# Patient Record
Sex: Female | Born: 1975 | Race: White | Hispanic: No | Marital: Married | Smoking: Never smoker
Health system: Southern US, Community
[De-identification: ages and names within clinical notes are randomized; demographics above are authoritative.]

## PROBLEM LIST (undated history)

## (undated) DIAGNOSIS — R609 Edema, unspecified: Secondary | ICD-10-CM

## (undated) DIAGNOSIS — R002 Palpitations: Secondary | ICD-10-CM

## (undated) DIAGNOSIS — B009 Herpesviral infection, unspecified: Secondary | ICD-10-CM

## (undated) DIAGNOSIS — R928 Other abnormal and inconclusive findings on diagnostic imaging of breast: Secondary | ICD-10-CM

## (undated) DIAGNOSIS — D649 Anemia, unspecified: Secondary | ICD-10-CM

## (undated) DIAGNOSIS — M549 Dorsalgia, unspecified: Secondary | ICD-10-CM

## (undated) DIAGNOSIS — R948 Abnormal results of function studies of other organs and systems: Secondary | ICD-10-CM

## (undated) DIAGNOSIS — R87619 Unspecified abnormal cytological findings in specimens from cervix uteri: Secondary | ICD-10-CM

## (undated) DIAGNOSIS — F32A Depression, unspecified: Secondary | ICD-10-CM

## (undated) DIAGNOSIS — G43909 Migraine, unspecified, not intractable, without status migrainosus: Secondary | ICD-10-CM

## (undated) DIAGNOSIS — Z0000 Encounter for general adult medical examination without abnormal findings: Secondary | ICD-10-CM

## (undated) DIAGNOSIS — I10 Essential (primary) hypertension: Secondary | ICD-10-CM

## (undated) DIAGNOSIS — E669 Obesity, unspecified: Secondary | ICD-10-CM

## (undated) DIAGNOSIS — A048 Other specified bacterial intestinal infections: Secondary | ICD-10-CM

## (undated) DIAGNOSIS — T7840XA Allergy, unspecified, initial encounter: Secondary | ICD-10-CM

## (undated) DIAGNOSIS — F329 Major depressive disorder, single episode, unspecified: Secondary | ICD-10-CM

## (undated) DIAGNOSIS — F419 Anxiety disorder, unspecified: Secondary | ICD-10-CM

## (undated) DIAGNOSIS — N809 Endometriosis, unspecified: Secondary | ICD-10-CM

## (undated) DIAGNOSIS — R1013 Epigastric pain: Secondary | ICD-10-CM

## (undated) DIAGNOSIS — G4730 Sleep apnea, unspecified: Secondary | ICD-10-CM

## (undated) DIAGNOSIS — R635 Abnormal weight gain: Secondary | ICD-10-CM

## (undated) DIAGNOSIS — R5383 Other fatigue: Secondary | ICD-10-CM

## (undated) DIAGNOSIS — R569 Unspecified convulsions: Secondary | ICD-10-CM

## (undated) DIAGNOSIS — N644 Mastodynia: Secondary | ICD-10-CM

## (undated) DIAGNOSIS — K219 Gastro-esophageal reflux disease without esophagitis: Secondary | ICD-10-CM

## (undated) HISTORY — PX: TONSILLECTOMY: SHX5217

## (undated) HISTORY — DX: Abnormal results of function studies of other organs and systems: R94.8

## (undated) HISTORY — DX: Endometriosis, unspecified: N80.9

## (undated) HISTORY — DX: Encounter for general adult medical examination without abnormal findings: Z00.00

## (undated) HISTORY — DX: Sleep apnea, unspecified: G47.30

## (undated) HISTORY — DX: Essential (primary) hypertension: I10

## (undated) HISTORY — DX: Dorsalgia, unspecified: M54.9

## (undated) HISTORY — DX: Unspecified abnormal cytological findings in specimens from cervix uteri: R87.619

## (undated) HISTORY — DX: Palpitations: R00.2

## (undated) HISTORY — DX: Edema, unspecified: R60.9

## (undated) HISTORY — DX: Depression, unspecified: F32.A

## (undated) HISTORY — DX: Migraine, unspecified, not intractable, without status migrainosus: G43.909

## (undated) HISTORY — DX: Epigastric pain: R10.13

## (undated) HISTORY — PX: ABDOMINAL HYSTERECTOMY: SHX81

## (undated) HISTORY — DX: Other specified bacterial intestinal infections: A04.8

## (undated) HISTORY — DX: Obesity, unspecified: E66.9

## (undated) HISTORY — DX: Anxiety disorder, unspecified: F41.9

## (undated) HISTORY — DX: Major depressive disorder, single episode, unspecified: F32.9

## (undated) HISTORY — PX: OTHER SURGICAL HISTORY: SHX169

## (undated) HISTORY — DX: Gastro-esophageal reflux disease without esophagitis: K21.9

## (undated) HISTORY — DX: Mastodynia: N64.4

## (undated) HISTORY — PX: DILATION AND CURETTAGE OF UTERUS: SHX78

## (undated) HISTORY — DX: Other abnormal and inconclusive findings on diagnostic imaging of breast: R92.8

## (undated) HISTORY — PX: WISDOM TOOTH EXTRACTION: SHX21

## (undated) HISTORY — DX: Anemia, unspecified: D64.9

## (undated) HISTORY — DX: Abnormal weight gain: R63.5

## (undated) HISTORY — DX: Unspecified convulsions: R56.9

## (undated) HISTORY — DX: Allergy, unspecified, initial encounter: T78.40XA

## (undated) HISTORY — DX: Other fatigue: R53.83

## (undated) HISTORY — DX: Herpesviral infection, unspecified: B00.9

---

## 2004-03-09 ENCOUNTER — Ambulatory Visit: Payer: Self-pay | Admitting: Internal Medicine

## 2004-04-27 ENCOUNTER — Ambulatory Visit: Payer: Self-pay | Admitting: Family Medicine

## 2004-06-01 ENCOUNTER — Ambulatory Visit: Payer: Self-pay | Admitting: Family Medicine

## 2004-08-23 ENCOUNTER — Ambulatory Visit: Payer: Self-pay | Admitting: Family Medicine

## 2004-08-29 ENCOUNTER — Ambulatory Visit: Payer: Self-pay | Admitting: Family Medicine

## 2004-11-05 ENCOUNTER — Ambulatory Visit: Payer: Self-pay | Admitting: Internal Medicine

## 2005-01-30 ENCOUNTER — Ambulatory Visit: Payer: Self-pay | Admitting: Family Medicine

## 2005-02-20 ENCOUNTER — Encounter: Admission: RE | Admit: 2005-02-20 | Discharge: 2005-02-20 | Payer: Self-pay | Admitting: Neurology

## 2005-02-28 ENCOUNTER — Encounter: Admission: RE | Admit: 2005-02-28 | Discharge: 2005-02-28 | Payer: Self-pay | Admitting: Neurology

## 2005-03-20 ENCOUNTER — Encounter: Admission: RE | Admit: 2005-03-20 | Discharge: 2005-03-20 | Payer: Self-pay | Admitting: Neurology

## 2005-04-17 ENCOUNTER — Ambulatory Visit: Payer: Self-pay | Admitting: Family Medicine

## 2005-09-18 ENCOUNTER — Ambulatory Visit: Payer: Self-pay | Admitting: Gynecology

## 2005-12-17 ENCOUNTER — Ambulatory Visit: Payer: Self-pay | Admitting: Gynecology

## 2006-01-01 ENCOUNTER — Ambulatory Visit: Payer: Self-pay | Admitting: Family Medicine

## 2006-01-24 ENCOUNTER — Ambulatory Visit: Payer: Self-pay | Admitting: Family Medicine

## 2006-01-24 ENCOUNTER — Ambulatory Visit (HOSPITAL_COMMUNITY): Admission: RE | Admit: 2006-01-24 | Discharge: 2006-01-24 | Payer: Self-pay | Admitting: Family Medicine

## 2006-02-11 ENCOUNTER — Ambulatory Visit: Payer: Self-pay | Admitting: Family Medicine

## 2006-04-22 HISTORY — PX: PARTIAL HYSTERECTOMY: SHX80

## 2006-06-19 ENCOUNTER — Ambulatory Visit: Payer: Self-pay | Admitting: Obstetrics & Gynecology

## 2006-07-30 ENCOUNTER — Ambulatory Visit: Payer: Self-pay | Admitting: Obstetrics & Gynecology

## 2006-11-09 ENCOUNTER — Emergency Department: Payer: Self-pay | Admitting: Emergency Medicine

## 2006-11-10 ENCOUNTER — Ambulatory Visit: Payer: Self-pay | Admitting: Gynecology

## 2006-11-14 ENCOUNTER — Ambulatory Visit (HOSPITAL_COMMUNITY): Admission: RE | Admit: 2006-11-14 | Discharge: 2006-11-14 | Payer: Self-pay | Admitting: Gynecology

## 2006-12-19 ENCOUNTER — Ambulatory Visit (HOSPITAL_COMMUNITY): Admission: RE | Admit: 2006-12-19 | Discharge: 2006-12-19 | Payer: Self-pay | Admitting: Gynecology

## 2006-12-19 ENCOUNTER — Ambulatory Visit: Payer: Self-pay | Admitting: Gynecology

## 2007-01-01 ENCOUNTER — Ambulatory Visit: Payer: Self-pay | Admitting: Gynecology

## 2007-02-19 ENCOUNTER — Ambulatory Visit: Payer: Self-pay | Admitting: Family Medicine

## 2007-02-19 DIAGNOSIS — R609 Edema, unspecified: Secondary | ICD-10-CM | POA: Insufficient documentation

## 2007-03-09 ENCOUNTER — Ambulatory Visit (HOSPITAL_COMMUNITY): Admission: RE | Admit: 2007-03-09 | Discharge: 2007-03-10 | Payer: Self-pay | Admitting: Gynecology

## 2007-03-09 ENCOUNTER — Encounter (INDEPENDENT_AMBULATORY_CARE_PROVIDER_SITE_OTHER): Payer: Self-pay | Admitting: Gynecology

## 2007-03-09 ENCOUNTER — Ambulatory Visit: Payer: Self-pay | Admitting: Gynecology

## 2007-03-31 ENCOUNTER — Ambulatory Visit: Payer: Self-pay | Admitting: Vascular Surgery

## 2007-04-07 ENCOUNTER — Ambulatory Visit: Payer: Self-pay | Admitting: Gynecology

## 2007-04-14 ENCOUNTER — Encounter (INDEPENDENT_AMBULATORY_CARE_PROVIDER_SITE_OTHER): Payer: Self-pay | Admitting: Internal Medicine

## 2007-06-03 ENCOUNTER — Ambulatory Visit: Payer: Self-pay | Admitting: Family Medicine

## 2007-06-03 DIAGNOSIS — F325 Major depressive disorder, single episode, in full remission: Secondary | ICD-10-CM | POA: Insufficient documentation

## 2007-06-03 DIAGNOSIS — F32A Depression, unspecified: Secondary | ICD-10-CM | POA: Insufficient documentation

## 2007-06-03 DIAGNOSIS — F341 Dysthymic disorder: Secondary | ICD-10-CM

## 2007-06-18 ENCOUNTER — Ambulatory Visit: Payer: Self-pay | Admitting: Family Medicine

## 2007-07-07 ENCOUNTER — Telehealth: Payer: Self-pay | Admitting: Family Medicine

## 2007-07-14 ENCOUNTER — Encounter: Payer: Self-pay | Admitting: Family Medicine

## 2007-07-16 ENCOUNTER — Ambulatory Visit: Payer: Self-pay | Admitting: Obstetrics & Gynecology

## 2007-07-22 ENCOUNTER — Encounter: Payer: Self-pay | Admitting: Internal Medicine

## 2007-08-06 ENCOUNTER — Ambulatory Visit: Payer: Self-pay | Admitting: Obstetrics & Gynecology

## 2007-08-14 ENCOUNTER — Ambulatory Visit (HOSPITAL_COMMUNITY): Admission: RE | Admit: 2007-08-14 | Discharge: 2007-08-14 | Payer: Self-pay | Admitting: Gynecology

## 2007-09-10 ENCOUNTER — Ambulatory Visit: Payer: Self-pay | Admitting: Obstetrics & Gynecology

## 2007-10-27 ENCOUNTER — Ambulatory Visit: Payer: Self-pay | Admitting: Obstetrics & Gynecology

## 2007-11-16 ENCOUNTER — Ambulatory Visit: Payer: Self-pay | Admitting: Family Medicine

## 2007-11-25 ENCOUNTER — Encounter (INDEPENDENT_AMBULATORY_CARE_PROVIDER_SITE_OTHER): Payer: Self-pay | Admitting: Internal Medicine

## 2008-03-03 ENCOUNTER — Ambulatory Visit: Payer: Self-pay | Admitting: Obstetrics and Gynecology

## 2008-03-04 ENCOUNTER — Encounter: Payer: Self-pay | Admitting: Family Medicine

## 2008-03-04 LAB — CONVERTED CEMR LAB
Trich, Wet Prep: NONE SEEN
WBC, Wet Prep HPF POC: NONE SEEN
Yeast Wet Prep HPF POC: NONE SEEN

## 2008-04-07 ENCOUNTER — Ambulatory Visit: Payer: Self-pay | Admitting: Family Medicine

## 2008-05-02 ENCOUNTER — Ambulatory Visit: Payer: Self-pay | Admitting: Family Medicine

## 2008-05-02 DIAGNOSIS — M549 Dorsalgia, unspecified: Secondary | ICD-10-CM | POA: Insufficient documentation

## 2008-05-02 DIAGNOSIS — K3189 Other diseases of stomach and duodenum: Secondary | ICD-10-CM

## 2008-05-02 DIAGNOSIS — R1013 Epigastric pain: Secondary | ICD-10-CM

## 2008-07-14 ENCOUNTER — Ambulatory Visit: Payer: Self-pay | Admitting: Family Medicine

## 2008-11-15 ENCOUNTER — Ambulatory Visit: Payer: Self-pay | Admitting: Family Medicine

## 2008-11-15 ENCOUNTER — Encounter (INDEPENDENT_AMBULATORY_CARE_PROVIDER_SITE_OTHER): Payer: Self-pay | Admitting: Internal Medicine

## 2008-11-15 DIAGNOSIS — R5383 Other fatigue: Secondary | ICD-10-CM

## 2008-11-15 DIAGNOSIS — R5381 Other malaise: Secondary | ICD-10-CM | POA: Insufficient documentation

## 2009-01-19 ENCOUNTER — Ambulatory Visit: Payer: Self-pay | Admitting: Family Medicine

## 2009-01-19 DIAGNOSIS — E669 Obesity, unspecified: Secondary | ICD-10-CM | POA: Insufficient documentation

## 2009-01-19 DIAGNOSIS — R948 Abnormal results of function studies of other organs and systems: Secondary | ICD-10-CM

## 2009-03-15 ENCOUNTER — Encounter (INDEPENDENT_AMBULATORY_CARE_PROVIDER_SITE_OTHER): Payer: Self-pay | Admitting: Internal Medicine

## 2009-03-31 ENCOUNTER — Ambulatory Visit: Payer: Self-pay | Admitting: Family Medicine

## 2009-08-09 ENCOUNTER — Ambulatory Visit: Payer: Self-pay | Admitting: Family Medicine

## 2009-08-09 DIAGNOSIS — R002 Palpitations: Secondary | ICD-10-CM

## 2009-08-10 ENCOUNTER — Encounter: Payer: Self-pay | Admitting: Family Medicine

## 2009-08-10 LAB — CONVERTED CEMR LAB
HDL: 55 mg/dL
LDL Cholesterol: 99 mg/dL

## 2009-08-14 ENCOUNTER — Telehealth: Payer: Self-pay | Admitting: Family Medicine

## 2009-08-18 ENCOUNTER — Ambulatory Visit: Payer: Self-pay | Admitting: Cardiology

## 2009-08-27 ENCOUNTER — Encounter: Payer: Self-pay | Admitting: Cardiovascular Disease

## 2009-09-15 ENCOUNTER — Ambulatory Visit: Payer: Self-pay | Admitting: Cardiovascular Disease

## 2009-12-04 ENCOUNTER — Ambulatory Visit: Payer: Self-pay | Admitting: Cardiovascular Disease

## 2010-02-05 ENCOUNTER — Ambulatory Visit: Payer: Self-pay | Admitting: Family Medicine

## 2010-02-26 ENCOUNTER — Telehealth: Payer: Self-pay | Admitting: Family Medicine

## 2010-03-07 ENCOUNTER — Telehealth: Payer: Self-pay | Admitting: Family Medicine

## 2010-03-08 ENCOUNTER — Telehealth: Payer: Self-pay | Admitting: Family Medicine

## 2010-03-12 ENCOUNTER — Ambulatory Visit: Payer: Self-pay | Admitting: Family Medicine

## 2010-03-12 ENCOUNTER — Encounter: Payer: Self-pay | Admitting: Family Medicine

## 2010-03-21 ENCOUNTER — Telehealth: Payer: Self-pay | Admitting: Family Medicine

## 2010-03-22 ENCOUNTER — Ambulatory Visit: Payer: Self-pay | Admitting: Family Medicine

## 2010-04-22 ENCOUNTER — Ambulatory Visit: Payer: Self-pay | Admitting: Family Medicine

## 2010-05-21 ENCOUNTER — Ambulatory Visit
Admission: RE | Admit: 2010-05-21 | Discharge: 2010-05-21 | Payer: Self-pay | Source: Home / Self Care | Attending: Family Medicine | Admitting: Family Medicine

## 2010-05-21 DIAGNOSIS — N644 Mastodynia: Secondary | ICD-10-CM | POA: Insufficient documentation

## 2010-05-22 NOTE — Assessment & Plan Note (Signed)
Summary: CPX  CYD   Vital Signs:  Patient profile:   35 year old female Height:      66 inches Weight:      189 pounds BMI:     30.62 Temp:     98.8 degrees F oral Pulse rate:   68 / minute Pulse rhythm:   regular BP sitting:   110 / 80  (right arm) Cuff size:   regular  Vitals Entered By: Linde Gillis CMA Duncan Dull) (February 05, 2010 9:12 AM) CC: complete physicial, no pap   History of Present Illness: 35 yo here for CPX.  Has been doing well.  Has had several follow ups with Dr. Mariah Milling for her heart palpitations.  Much improved, has had a couple of episodes. Only takes the metoprolol 12.5 mg as needed. Does not make her feel dizzy or any other side effects.  Weight gain- has continued to gain weight over the last several years.  Weight 175 in 07/2007, was 165 in 2008.  Works out at [employer] three nights a week, just cancelled her [employer] as it was not really working for her.  Does not eat processed foods.    Depression- since her hysterectomy has had some decreased sex drive (still has her ovaries).  Always had some issues with anxiety and depression.   Current Medications (verified): 1)  Ibuprofen 200 Mg Tabs (Ibuprofen) .... Otc As Directed. 2)  Metoprolol Tartrate 25 Mg Tabs (Metoprolol Tartrate) .... Take One Tablet By Mouth Twice A Day As Needed For Palpatations 3)  Wellbutrin Sr 150 Mg Xr12h-Tab (Bupropion Hcl) .Marland Kitchen.. 1 Tab By Mouth Daily.  Allergies (verified): No Known Drug Allergies  Past History:  Past Medical History: Last updated: 08/18/2009 1. palpitations 2. fatigue 3. dyspepsia 4. back pain 5. depression/anxiety 6. History of TAH  Past Surgical History: Last updated: 08/17/2009 partial hysterectomy--endometriosis--2008 tonsillectomy, adenoidectomy, wisdom tooth extraction, and D&C.   Family History: Last updated: 08/18/2009 History of ovarian cancer in her mother, sister died at age 29 of complications from hypokalemia (was  getting regular IV K infusions) with a long history of pseudotumor cerebri.  She has a strong family history of coronary artery disease, hypertension, diabetes, and early strokes.  Mother with CVA at 24.  Father with CABG at age 69.  Grandfather with MI at 31.  The patient underwent a BRCA testing, which was negative in May of this year.   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 Eyes:  Denies blurring. ENT:  Denies difficulty swallowing. CV:  Complains of palpitations; denies chest pain or discomfort, near fainting, and shortness of breath with exertion. Resp:  Denies shortness of breath. GI:  Denies abdominal pain, bloody stools, and change in bowel habits. GU:  Complains of decreased libido; denies dysuria. MS:  Denies joint pain, joint redness, and joint swelling. Derm:  Denies rash. Neuro:  Denies headaches. Psych:  Complains of anxiety, depression, and easily tearful; denies easily angered, irritability, mental problems, panic attacks, sense of great danger, suicidal thoughts/plans, thoughts of violence, unusual visions or sounds, and thoughts /plans of harming others. Endo:  Denies cold intolerance  and heat intolerance. Heme:  Denies abnormal bruising and bleeding.  Physical Exam  General:  alert, well-developed, well-nourished, well-hydrated, and overweight-appearing.  NAD weight stable Head:  normocephalic and atraumatic.   Eyes:  vision grossly intact, pupils equal, and pupils round.   Ears:  R ear normal and L ear normal.   Nose:  no external deformity.   Mouth:  good  dentition.   Neck:  No deformities, masses, or tenderness noted. Breasts:  No mass, nodules, thickening, tenderness, bulging, retraction, inflamation, nipple discharge or skin changes noted.   Lungs:  Normal respiratory effort, chest expands symmetrically. Lungs are clear to auscultation, no crackles or wheezes. Heart:  normal rate, regular rhythm, and no murmur.   Abdomen:  Bowel sounds positive,abdomen soft and non-tender without masses, organomegaly or hernias noted.no guarding, no rigidity, and no rebound tenderness.   Genitalia:  Pelvic Exam:        External: normal female genitalia without lesions or masses        Vagina: normal without lesions or masses               Adnexa: normal bimanual exam without masses or fullness         Pap smear: not performed Msk:  normal ROM.   Extremities:  no edema either lower legs Neurologic:  alert & oriented X3 and gait normal.   Skin:  Intact without suspicious lesions or rashes Psych:  Cognition and judgment appear intact. Alert and cooperative with normal attention span and concentration. No apparent delusions, illusions, hallucinations   Impression & Recommendations:  Problem # 1:  Preventive Health Care (ICD-V70.0) Reviewed preventive care protocols, scheduled due services, and updated immunizations Discussed nutrition, exercise, diet, and healthy lifestyle.  Flu shot today. Pelvic and breast exam.  Problem # 2:  DEPRESSION/ANXIETY (ICD-300.4) Assessment: Deteriorated Will restart Wellbutrin 150 mg daily (less sexual dysfunction or weight gain).  Problem # 3:  WEIGHT GAIN (ICD-783.1) Assessment: Unchanged refer to nutrionist today. Orders: Nutrition Referral (Nutrition)  Complete Medication List: 1)  Ibuprofen 200 Mg Tabs (Ibuprofen) .... Otc as directed. 2)  Metoprolol Tartrate 25 Mg Tabs (Metoprolol tartrate) .... Take one tablet by mouth twice a day as needed for palpatations 3)  Wellbutrin Sr 150 Mg Xr12h-tab (Bupropion hcl)  .Marland Kitchen.. 1 tab by mouth daily.  Patient Instructions: 1)  Great to see you. 2)  Please stop by to see Shirlee Limerick on your way out to set up your nutrition appointment. 3)  Call me or come see me in one month to follow up on the Wellbutrin. Prescriptions: WELLBUTRIN SR 150 MG XR12H-TAB (BUPROPION HCL) 1 tab by mouth daily.  #30 x 0   Entered and Authorized by:   Ruthe Mannan MD   Signed by:   Ruthe Mannan MD on 02/05/2010   Method used:   [facility]        [facility] [id number]       [address]       [place], [place]  [address]       Ph: [phone] or [phone]       Fax: [phone]   RxID:   [id number]    Orders Added: 1)  Nutrition Referral [Nutrition] 2)  Est. Patient 18-39 years [99395] 3)  Est. Patient Level III [99213]    Current Allergies (reviewed today): No known allergies  HDL Result Date:  08/10/2009 HDL Result:  55 LDL Result Date:  08/10/2009 LDL Result:  99 PAP Next Due:  Not Indicated    Appended Document: CPX  CYD     Allergies: No Known Drug Allergies   Complete Medication List: 1)  Ibuprofen 200 Mg Tabs (Ibuprofen) .... Otc as directed. 2)  Metoprolol Tartrate 25 Mg Tabs (Metoprolol tartrate) .... Take one tablet by mouth twice a day as needed for palpatations 3)  Wellbutrin Sr 150 Mg Xr12h-tab (Bupropion hcl) .Marland Kitchen.. 1 tab by mouth daily.  Other Orders: Flu Vaccine 36yrs + [phone]) Admin 1st Vaccine (40102)   Orders Added: 1)  Flu Vaccine 2yrs + [72536] 2)  Admin 1st Vaccine [64403]   Immunizations Administered:  Influenza Vaccine # 1:    Vaccine Type: Fluvax 3+    Site: left deltoid    Mfr: GlaxoSmithKline    Dose: 0.5 ml    Route: IM    Given by: Linde Gillis CMA (AAMA)    Exp. Date: 10/20/2010    Lot #: [id number]    VIS given: 11/14/09 version given February 05, 2010.  Flu Vaccine Consent Questions:    Do you have a history of severe allergic reactions to this vaccine? no    Any prior history of  allergic reactions to egg and/or gelatin? no    Do you have a sensitivity to the preservative Thimersol? no    Do you have a past history of Guillan-Barre Syndrome? no    Do you currently have an acute febrile illness? no    Have you ever had a severe reaction to latex? no    Vaccine information given and explained to patient? yes    Are you currently pregnant? no   Immunizations Administered:  Influenza Vaccine # 1:    Vaccine Type: Fluvax 3+    Site: left deltoid    Mfr: GlaxoSmithKline    Dose: 0.5 ml    Route: IM    Given by: Linde Gillis CMA (AAMA)    Exp. Date: 10/20/2010    Lot #: [id number]    VIS given: 11/14/09 version given February 05, 2010.

## 2010-05-22 NOTE — Consult Note (Signed)
Summary: [facility]  [facility]   Imported By: Lanelle Bal 03/24/2010 08:59:39  _____________________________________________________________________  External Attachment:    Type:   Image     Comment:   External Document

## 2010-05-22 NOTE — Assessment & Plan Note (Signed)
Summary: NP6/AMD   Visit Type:  Initial Consult Referring Provider:  Ruthe Mannan MD Primary Provider:  Ruthe Mannan MD  CC:  Patient haven't being feeling good and she will have episodes of  racing heart  with numbess in her right arm and headache and nausea associated with it. Patient also complain about having airway breathing issue that she couldn't explain why this was occurring. Patient have problems swallowing from  time to time. She has gained 20 pounds in the last year. She have more edema on her right side that appear to be triggerred by [place] foods or foods with sodium.Marland Kitchen  History of Present Illness: 35 yo with history of palpitations presents for cardiac evaluation.  She has been having episodes of racing heart rate that occur without warning or trigger.  She has had 5 episodes now over the last couple of months.  They last up to 10 minutes at a time.  She felt lightheaded but never had syncope.  The most recent episode was 1 week ago and occurred while she was washing dishes.  She does not tend to be doing anything strenuous when she gets these episodes.  She drinks 3 cups of coffee a day chronically and has not had increased stress in her life recently.    ECG: NSR, rSR' pattern in V1 but narrow QRS, normal QTc.    Labs (4/11): HCT 37.4, LDL 99, HDL 55  Preventive Screening-Counseling & Management  Alcohol-Tobacco     Alcohol drinks/day: <1 a month     Alcohol type: wine     Smoking Status: never  Caffeine-Diet-Exercise     Caffeine use/day: Yes 3 cup of coffee daily     Diet Comments: No      Does Patient Exercise: yes     Type of exercise: Treadmill     Exercise (avg: min/session): <30     Times/week: 3 times weekly   Current Medications (verified): 1)  Hydrochlorothiazide 25 Mg  Tabs (Hydrochlorothiazide) .... 1/2 To 1 Tablet As Needed 2)  Cyclobenzaprine Hcl 10 Mg  Tabs (Cyclobenzaprine Hcl) .... 1/2 To 1 Tab By Mouth Three Times A Day As Needed 3)  Metamucil 30.9 %  Powd (Psyllium) .... Otc As Directed. 4)  Ibuprofen 200 Mg Tabs (Ibuprofen) .... Otc As Directed.  Allergies (verified): No Known Drug Allergies  Past History:  Past Medical History: 1. palpitations 2. fatigue 3. dyspepsia 4. back pain 5. depression/anxiety 6. History of TAH  Family History: History of ovarian cancer in her mother, sister died at age 27 of complications from hypokalemia (was getting regular IV K infusions) with a long history of pseudotumor cerebri.  She has a strong family history of coronary artery disease, hypertension, diabetes, and early strokes.  Mother with CVA at 74.  Father with CABG at age 51.  Grandfather with MI at 69.  The patient underwent a BRCA testing, which was negative in May of this year.   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   Vital Signs:  Patient profile:   35 year old female Height:      65 inches Weight:      189.50 pounds BMI:     31.65  Pulse rate:   68 / minute BP sitting:   130 / 82  (left arm) Cuff size:   large  Physical Exam  General:  Well developed, well nourished, in no acute distress. Head:  normocephalic and atraumatic Nose:  no deformity, discharge, inflammation, or lesions Mouth:  Teeth, gums and palate normal. Oral mucosa normal. Neck:  Neck supple, no JVD. No masses, thyromegaly or abnormal cervical nodes. Lungs:  Clear bilaterally to auscultation and percussion. Heart:  Non-displaced PMI, chest non-tender; regular rate and rhythm, S1, S2 without murmurs, rubs or gallops. Carotid upstroke normal, no bruit.  Pedals normal pulses. No edema, no varicosities. Abdomen:  Bowel sounds positive; abdomen soft and non-tender without masses,  organomegaly, or hernias noted. No hepatosplenomegaly. Msk:  Back normal, normal gait. Muscle strength and tone normal. Extremities:  No clubbing or cyanosis. Neurologic:  Alert and oriented x 3. Skin:  Intact without lesions or rashes. Psych:  Normal affect.   Impression & Recommendations:  Problem # 1:  PALPITATIONS (ICD-785.1) Patient has episodes of heart racing (5 times in 2-3 months) that occur without trigger and last about 10 minutes.  It is possible that these represent an SVT like AVNRT.  We discussed vagal maneuvers (bearing down, etc) that could stop an episode potentially.  She should cut out caffeine. I am going to set her up for a 3 week event monitor to try to document an arrhythmia.     Other Orders: Holter Monitor (Holter Monitor)  Patient Instructions: 1)  Your physician recommends that you schedule a follow-up appointment in: 4-5 weeks with Dr. Mariah Milling 2)  Your physician has recommended that you wear an event monitor.  Event monitors are medical devices that record the heart's electrical activity. Doctors most often use these monitors to diagnose arrhythmias. Arrhythmias are problems with the speed or rhythm of the heartbeat. The monitor is a small, portable device. You can wear one while you do your normal daily activities. This is usually used to diagnose what is causing palpitations/syncope (passing out). This will be mailed to your house.

## 2010-05-22 NOTE — Progress Notes (Signed)
Summary: wellbutrin not helping  Phone Note Call from Patient Call back at [phone]   Caller: Patient Call For: Ruthe Mannan MD Summary of Call: Pt called to report that wellbutrin is not helping.  She said she felt better the first week but now she cant tell any diffrence- has been taking this for about 3 weeks.  She is asking if dose needs to be adjusted.  [facility]. Initial call taken by: Lowella Petties CMA, AAMA,  February 26, 2010 9:25 AM  Follow-up for Phone Call        Try taking one pill at breakfast, one at lunch (150 mg) to see if that helps.  If so, we can refill higher dose. Ruthe Mannan MD  February 26, 2010 9:33 AM  Left message on voicemail for patient to return call.  Linde Gillis CMA Duncan Dull)  February 26, 2010 9:56 AM   Patient returned call and was notified. She will call back after trying the one pill at breakfast and one at lunch.   Follow-up by: Melody Comas,  February 26, 2010 1:26 PM

## 2010-05-22 NOTE — Procedures (Signed)
[facility]   Imported By: Harlon Flor 09/20/2009 16:44:53  _____________________________________________________________________  External Attachment:    Type:   Image     Comment:   External Document

## 2010-05-22 NOTE — Assessment & Plan Note (Signed)
Summary: F3M/AMD   Visit Type:  Follow-up Referring Provider:  Ruthe Mannan MD Primary Provider:  Ruthe Mannan MD  CC:  no complaints.  History of Present Illness: 35 yo with history of palpitations presents for follow up.  since her last clinic visit, she has been doing well. She has had rare episodes of tachypalpitations though these have been short and infrequent. She has had to take her metoprolol rarely. Otherwise she feels well, has had significant stress at work as it has been busy. She has been working out several times per week with no symptoms.  the monitor shows sinus tachycardia with heart rates ranging from 105 beats a minute to  150 beats per minute. She denies doing anything active during these times.No other ectopy noted or SVT noted.  ECG: Normal sinus rhythm with incomplete right bundle branch block, rate 66 beats per minute, no significant ST or T wave changes  Labs (4/11): HCT 37.4, LDL 99, HDL 55  Current Medications (verified): 1)  Ibuprofen 200 Mg Tabs (Ibuprofen) .... Otc As Directed. 2)  Metoprolol Tartrate 25 Mg Tabs (Metoprolol Tartrate) .... Take One Tablet By Mouth Twice A Day As Needed For Palpatations  Allergies (verified): No Known Drug Allergies  Review of Systems  The patient denies fever, weight loss, weight gain, vision loss, decreased hearing, hoarseness, chest pain, syncope, dyspnea on exertion, peripheral edema, prolonged cough, abdominal pain, incontinence, muscle weakness, depression, and enlarged lymph nodes.         Palpitations  Vital Signs:  Patient profile:   35 year old female Height:      66 inches Weight:      187.75 pounds BMI:     30.41 Pulse rate:   66 / minute BP sitting:   118 / 74  (left arm) Cuff size:   regular  Vitals Entered By: Caralee Ates CMA (December 04, 2009 4:18 PM)  Physical Exam  General:  Well developed, well nourished, in no acute distress. Head:  normocephalic and atraumatic Neck:  Neck supple, no JVD. No  masses, thyromegaly or abnormal cervical nodes. Lungs:  Clear bilaterally to auscultation and percussion. Heart:  Non-displaced PMI, chest non-tender; regular rate and rhythm, S1, S2 without murmurs, rubs or gallops. Carotid upstroke normal, no bruit. . Pedals normal pulses. No edema, no varicosities. Abdomen:   abdomen soft and non-tender without masses Msk:  Back normal, normal gait. Muscle strength and tone normal. Pulses:  pulses normal in all 4 extremities Extremities:  No clubbing or cyanosis. Neurologic:  Alert and oriented x 3. Skin:  Intact without lesions or rashes. Psych:  Normal affect.   Impression & Recommendations:  Problem # 1:  PALPITATIONS (ICD-785.1) her palpitations have not been an issue recently. She will continue to use metoprolol p.r.n. No further workup needed at this time.  Her updated medication list for this problem includes:    Metoprolol Tartrate 25 Mg Tabs (Metoprolol tartrate) .Marland Kitchen... Take one tablet by mouth twice a day as needed for palpatations  Orders: EKG w/ Interpretation (93000)  Problem # 2:  PREVENTIVE HEALTH CARE (ICD-V70.0) She does have a family history of coronary artery disease though her father is a smoker and bad diabetic. Her lipid panel is reasonable and she is exercising. We will not start a statin at this time.

## 2010-05-22 NOTE — Progress Notes (Signed)
Summary: wellbutrin is working  Phone Note Call from Patient Call back at [phone]   Caller: Patient Call For: Ruthe Mannan MD Summary of Call: Pt states the higher dose of wellbutrin seems to be working better for her.  She  would like a new script sent to [facility]. Initial call taken by: Lowella Petties CMA, AAMA,  March 07, 2010 12:16 PM    New/Updated Medications: WELLBUTRIN SR 150 MG XR12H-TAB (BUPROPION HCL) 1 tab by mouth two times a day Prescriptions: WELLBUTRIN SR 150 MG XR12H-TAB (BUPROPION HCL) 1 tab by mouth two times a day  #60 x 6   Entered and Authorized by:   Ruthe Mannan MD   Signed by:   Ruthe Mannan MD on 03/07/2010   Method used:   [facility]        [facility]       [address]       [place], [place]  [address]       Ph: [phone] or [phone]       Fax: [phone]   RxID:   [id number]

## 2010-05-22 NOTE — Progress Notes (Signed)
Summary: Lab results  Phone Note Call from Patient Call back at Cell:  [phone]   Caller: Patient Call For: Ruthe Mannan MD Summary of Call: Patient says she had labs done at [facility] last Thursday.  She will not be at her work number for the next few days.  Please advise her on her cell phone (listed above). Initial call taken by: Delilah Shan CMA Duncan Dull),  August 14, 2009 2:55 PM  Follow-up for Phone Call        ok see lab report. Ruthe Mannan MD  August 15, 2009 8:03 AM      Appended Document: Lab results Patient says you mentioned sending her for a Holter Monitor if her labs were okay.  She wants to know what the process is for that.

## 2010-05-22 NOTE — Assessment & Plan Note (Signed)
Summary: EC6/AMD   Referring Provider:  Ruthe Mannan MD Primary Provider:  Ruthe Mannan MD  CC:  Review Lifewatch monitor;SOB;^ HR.  History of Present Illness: 35 yo with history of palpitations presents for follow up after an event monitor/three weeks.   She reports that she had several episodes of fast rhythm, palpitations while she was monitored. One episode woke her up at 3 AM. They were short though uncomfortable. Otherwise she has been doing well. She did cut back on her coffee and states that this helped decrease the number of episodes.   She drinks 3 cups of coffee a day chronically and has not had increased stress in her life recently.    the monitor shows sinus tachycardia on May 16, a 15, B12, May 9 with heart rates ranging from 105 beats a minute to an episode at midnight on May 9 estimated at 150 beats per minute. She denies doing anything active during these times. She has not been doing any exercise on a regular basis. No other ectopy noted or SVT noted.  ECG: NSR, rSR' pattern in V1 but narrow QRS, normal QTc.    Labs (4/11): HCT 37.4, LDL 99, HDL 55  Problems Prior to Update: 1)  Palpitations  (ICD-785.1) 2)  Nonspecific Abnorm Results Oth Spec Funct Study  (ICD-794.9) 3)  Weight Gain  (ICD-783.1) 4)  Fatigue  (ICD-780.79) 5)  Dyspepsia  (ICD-536.8) 6)  Back Pain  (ICD-724.5) 7)  Depression/anxiety  (ICD-300.4) 8)  Dependent Edema  (ICD-782.3)  Medications Prior to Update: 1)  Hydrochlorothiazide 25 Mg  Tabs (Hydrochlorothiazide) .... 1/2 To 1 Tablet As Needed 2)  Cyclobenzaprine Hcl 10 Mg  Tabs (Cyclobenzaprine Hcl) .... 1/2 To 1 Tab By Mouth Three Times A Day As Needed 3)  Metamucil 30.9 % Powd (Psyllium) .... Otc As Directed. 4)  Ibuprofen 200 Mg Tabs (Ibuprofen) .... Otc As Directed.  Current Medications (verified): 1)  Hydrochlorothiazide 25 Mg  Tabs (Hydrochlorothiazide) .... 1/2 To 1 Tablet As Needed 2)  Cyclobenzaprine Hcl 10 Mg  Tabs (Cyclobenzaprine Hcl)  .... 1/2 To 1 Tab By Mouth Three Times A Day As Needed 3)  Metamucil 30.9 % Powd (Psyllium) .... Otc As Directed. 4)  Ibuprofen 200 Mg Tabs (Ibuprofen) .... Otc As Directed.  Allergies (verified): No Known Drug Allergies  Past History:  Past Medical History: Last updated: 08/18/2009 1. palpitations 2. fatigue 3. dyspepsia 4. back pain 5. depression/anxiety 6. History of TAH  Past Surgical History: Last updated: 08/17/2009 partial hysterectomy--endometriosis--2008 tonsillectomy, adenoidectomy, wisdom tooth extraction, and D&C.   Family History: Last updated: 08/18/2009 History of ovarian cancer in her mother, sister died at age 81 of complications from hypokalemia (was getting regular IV K infusions) with a long history of pseudotumor cerebri.  She has a strong family history of coronary artery disease, hypertension, diabetes, and early strokes.  Mother with CVA at 29.  Father with CABG at age 54.  Grandfather with MI at 25.  The patient underwent a BRCA testing, which was negative in May of this year.   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         palpitations  Vital Signs:  Patient profile:   35 year old female Height:      66 inches Weight:      188 pounds Pulse rate:   52 / minute Pulse rhythm:   regular BP sitting:   114 / 80  (left arm) Cuff size:    regular  Vitals Entered By: Stanton Kidney, EMT-P (Sep 15, 2009 10:05 AM)  Physical Exam  General:  Well developed, well nourished, in no acute distress. Head:  normocephalic and atraumatic Neck:  Neck supple, no JVD. No masses, thyromegaly or abnormal cervical nodes. Lungs:  Clear bilaterally to auscultation and percussion. Heart:  Non-displaced PMI, chest non-tender; regular rate and rhythm, S1, S2 without murmurs, rubs or gallops. Carotid upstroke normal, no bruit. . Pedals normal pulses. No edema, no varicosities. Abdomen:  Bowel sounds positive; abdomen soft and non-tender without masses, organomegaly, or hernias noted. No hepatosplenomegaly. Msk:  Back normal, normal gait. Muscle strength and tone normal. Pulses:  pulses normal in all 4 extremities Extremities:  No clubbing or cyanosis. Neurologic:  Alert and oriented x 3. Skin:  Intact without lesions or rashes. Psych:  Normal affect.   Impression & Recommendations:  Problem # 1:  PALPITATIONS (ICD-785.1) etiology of her palpitations may be due to short runs of sinus tachycardia. She had documented rhythms up to 110 beats a minute and on one episode to 150 beats a minute on May 9. It is uncertain if this is what she was appreciating as she did not keep a diary.  We have given her a prescription for metoprolol 25 mg b.i.d. p.r.n. which she can use if she continues to have episodes. She can either take this on a regular basis or as needed. Have asked her to contact me if she has worsening episodes or if the Toprol does not seem to help.  I did mention to her that if she has rare no severe symptoms that we are not able to capture on an event monitor, she could have a loop monitor placed. This would monitor her events over the next 3 years.  Her updated medication list for this problem includes:    Metoprolol Tartrate 25 Mg Tabs (Metoprolol tartrate) .Marland Kitchen... Take one tablet by mouth twice a day as needed for palpatations  Problem # 2:   PREVENTIVE HEALTH CARE (ICD-V70.0) We spent some time talking about her cholesterol. Her cholesterol was actually in an adequate range with total cholesterol 160, LDL 99, HDL greater than 40. I do not believe this is particularly elevated though she does have a strong family history of coronary disease. Parents were smokers and had diabetes. Have asked her to watch her weight, increase her exercise and to check her cholesterol on an annual basis.  Patient Instructions: 1)  Your physician recommends that you schedule a follow-up appointment as needed 2)  Your physician has recommended you make the following change in your medication:  add Metoprolol Tartrate 25mg  two times a day as needed for palpatations Prescriptions: METOPROLOL TARTRATE 25 MG TABS (METOPROLOL TARTRATE) Take one tablet by mouth twice a day as needed for palpatations  #60 x 10   Entered by:   Hardin Negus, RMA   Authorized by:   Dossie Arbour MD   Signed by:   Hardin Negus, RMA on 09/15/2009   Method used:  Electronically to        [facility]  [place] (retail)       [address]       [place], [place]  [address]       Ph: [phone] or [phone]       Fax: [phone]   RxID:   [id number]

## 2010-05-22 NOTE — Progress Notes (Signed)
Summary: notes request  Phone Note From Other Clinic   Caller: Nurse Call For: aron Summary of Call: most current physicans notes faxed to [employer] [phone] Initial call taken by: Benny Lennert CMA [facility]),  March 08, 2010 3:46 PM  Follow-up for Phone Call        ok to fax notes. Ruthe Mannan MD  March 08, 2010 4:04 PM  Last OV note faxed to [employer] at [phone].  Follow-up by: Linde Gillis CMA [facility]),  March 08, 2010 4:23 PM

## 2010-05-22 NOTE — Procedures (Signed)
Summary: Life[employer]  Life[employer]   Imported By: Harlon Flor 09/26/2009 14:58:25  _____________________________________________________________________  External Attachment:    Type:   Image     Comment:   External Document

## 2010-05-22 NOTE — Progress Notes (Signed)
Summary: Denial of coverage for nutrition counceling  Phone Note From Other Clinic   Caller: Carla-[employer] Call For: Dr. Dayton Martes Summary of Call: Albin Felling called to let us know that nutrional counseling is not a covered service under this patients insurance.  She will mail/fax a copy of the denial letter to our office along with the appeals process. Initial call taken by: Linde Gillis CMA [facility]),  March 21, 2010 11:09 AM

## 2010-05-22 NOTE — Progress Notes (Signed)
Summary: PHI  PHI   Imported By: Harlon Flor 08/22/2009 09:32:46  _____________________________________________________________________  External Attachment:    Type:   Image     Comment:   External Document

## 2010-05-22 NOTE — Assessment & Plan Note (Signed)
Summary: RACING HEART/BILLIE PATIENT/[facility]   Vital Signs:  Patient profile:   35 year old female Height:      65 inches Weight:      187.50 pounds BMI:     31.31 Temp:     99.2 degrees F oral Pulse rate:   68 / minute Pulse rhythm:   regular BP sitting:   118 / 76  (left arm) Cuff size:   regular  Vitals Entered By: Lewanda Rife LPN (August 09, 2009 12:27 PM) CC: racing heart on and off   History of Present Illness: 35 yo new to me here for heart palpitations.  Started abruptly one week ago.  Was standing at kitchen sink, felt her heart beat, then a head ache, became short of breath, nauseated.  Sat down and it immediately became better.  No CP. Occurred again a few more times after that. Not worsened by exertion.  She has noticed some inc in her weight despite exercising and improving diet. Also some constipation and fatigue.  Current Medications (verified): 1)  Hydrochlorothiazide 25 Mg  Tabs (Hydrochlorothiazide) .... 1/2 To 1 Tablet As Needed 2)  Cyclobenzaprine Hcl 10 Mg  Tabs (Cyclobenzaprine Hcl) .... 1/2 To 1 Tab By Mouth Three Times A Day As Needed 3)  Metamucil 30.9 % Powd (Psyllium) .... Otc As Directed. 4)  Ibuprofen 200 Mg Tabs (Ibuprofen) .... Otc As Directed.  Allergies (verified): No Known Drug Allergies  Review of Systems      See HPI General:  Complains of fatigue; denies chills and fever. CV:  Complains of palpitations and weight gain; denies fainting, fatigue, swelling of feet, and swelling of hands. Resp:  Complains of shortness of breath; denies sputum productive and wheezing. GI:  Complains of nausea; denies vomiting.  Physical Exam  General:  alert, well-developed, well-nourished, well-hydrated, and overweight-appearing.  NAD weight stable Mouth:  good dentition, pharynx pink and moist, and no exudates.   Lungs:  normal respiratory effort, no intercostal retractions, no accessory muscle use, and normal breath sounds.   Heart:  normal rate, regular  rhythm, and no murmur.   Extremities:  no edema either lower legs Psych:  normally interactive, not anxious appearing, and not depressed appearing.     Impression & Recommendations:  Problem # 1:  PALPITATIONS (ICD-785.1) Assessment New EKG NSR. ?possible thyroid dysfunction or anemia. Will check TSH, T4, T3 and CBC. If normal, consider cards referral for holter monitor and work up(strong FH of CAD). Check FLP as well--labs all written on prescription pad as she works for [employer]. Orders: EKG w/ Interpretation (93000)  Complete Medication List: 1)  Hydrochlorothiazide 25 Mg Tabs (Hydrochlorothiazide) .... 1/2 to 1 tablet as needed 2)  Cyclobenzaprine Hcl 10 Mg Tabs (Cyclobenzaprine hcl) .... 1/2 to 1 tab by mouth three times a day as needed 3)  Metamucil 30.9 % Powd (Psyllium) .... Otc as directed. 4)  Ibuprofen 200 Mg Tabs (Ibuprofen) .... Otc as directed.  Current Allergies (reviewed today): No known allergies   Appended Document: Orders Update    Clinical Lists Changes  Orders: Added new Referral order of Cardiology Referral (Cardiology) - Signed      Appended Document: RACING HEART/BILLIE PATIENT/[facility] Cards referral sent to [facility].  Appended Document: RACING HEART/BILLIE PATIENT/[facility] In process of Cardiology referral..[facility]

## 2010-05-23 ENCOUNTER — Ambulatory Visit: Payer: Self-pay | Admitting: Family Medicine

## 2010-05-23 ENCOUNTER — Encounter: Payer: Self-pay | Admitting: Family Medicine

## 2010-05-25 ENCOUNTER — Encounter: Payer: Self-pay | Admitting: Family Medicine

## 2010-05-25 DIAGNOSIS — R928 Other abnormal and inconclusive findings on diagnostic imaging of breast: Secondary | ICD-10-CM | POA: Insufficient documentation

## 2010-05-29 ENCOUNTER — Telehealth: Payer: Self-pay | Admitting: Family Medicine

## 2010-05-30 NOTE — Assessment & Plan Note (Signed)
Summary: RIGHT BREAST PAIN/CLE   Vital Signs:  Patient profile:   35 year old female Height:      66 inches Weight:      193.50 pounds BMI:     31.34 Temp:     98.5 degrees F oral Pulse rate:   80 / minute Pulse rhythm:   regular BP sitting:   120 / 80  (right arm) Cuff size:   regular  Vitals Entered By: Linde Gillis CMA [facility]) (May 21, 2010 8:09 AM) CC: right breast pain   History of Present Illness: 35 yo here for right breast pain.  Woke her up from sleep approx 1 week ago. Pain was "sharp" and behind her nipple, breast was very tender to palpation. Had body aches, no fever. Over next several days, pain also at 12 oclock and 6 oclock position of right breast. No erythema. No changes in appearance of nipple, no nipple discharge.  No family h/o breast CA.  Current Medications (verified): 1)  Ibuprofen 200 Mg Tabs (Ibuprofen) .... Otc As Directed. 2)  Metoprolol Tartrate 25 Mg Tabs (Metoprolol Tartrate) .... Take One Tablet By Mouth Twice A Day As Needed For Palpatations 3)  Wellbutrin Sr 150 Mg Xr12h-Tab (Bupropion Hcl) .Marland Kitchen.. 1 Tab By Mouth Two Times A Day  Allergies (verified): No Known Drug Allergies  Past History:  Past Medical History: Last updated: 08/18/2009 1. palpitations 2. fatigue 3. dyspepsia 4. back pain 5. depression/anxiety 6. History of TAH  Past Surgical History: Last updated: 08/17/2009 partial hysterectomy--endometriosis--2008 tonsillectomy, adenoidectomy, wisdom tooth extraction, and D&C.   Family History: Last updated: 08/18/2009 History of ovarian cancer in her mother, sister died at age 35 of complications from hypokalemia (was getting regular IV K infusions) with a long history of pseudotumor cerebri.  She has a strong family history of coronary artery disease, hypertension, diabetes, and early strokes.  Mother with CVA at 76.  Father with CABG at age 76.  Grandfather with MI at 55.  The patient underwent a BRCA testing, which was  negative in May of this year.   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 GI:  Denies abdominal pain, nausea, and vomiting.  Physical Exam  General:  alert, well-developed, well-nourished, well-hydrated, and overweight-appearing.  NAD  Breasts:  No mass, nodules, thickening, tenderness, bulging, retraction, inflamation, nipple discharge or skin changes noted.   Psych:  Cognition and judgment appear intact. Alert and cooperative with normal attention span and concentration. No apparent delusions, illusions, hallucinations   Impression & Recommendations:  Problem # 1:  BREAST PAIN, RIGHT (ICD-611.71) Assessment New Likely resolving mastitis but given that she is still having some discomfort, will get diagnostic mammography to rule out any masses. Pt in agreement with plan. Orders: Radiology Referral (Radiology)  Complete Medication List: 1)  Ibuprofen 200 Mg Tabs (Ibuprofen) .... Otc as directed. 2)  Metoprolol Tartrate 25 Mg Tabs (Metoprolol tartrate) .... Take one tablet by mouth twice a day as needed for palpatations 3)  Wellbutrin Sr 150 Mg Xr12h-tab (Bupropion hcl) .Marland Kitchen.. 1 tab by mouth two times a day  Patient Instructions: 1)  Please  stop by to see Shirlee Limerick on your way out.   Orders Added: 1)  Radiology Referral [Radiology] 2)  Est. Patient Level IV [84696]    Current Allergies (reviewed today): No known allergies

## 2010-05-30 NOTE — Miscellaneous (Signed)
Summary: Orders Update  Clinical Lists Changes  Problems: Added new problem of ABNORMAL MAMMOGRAM, RIGHT BREAST (ICD-793.80) Added new problem of MAMMOGRAM, ABNORMAL, LEFT (ICD-793.80) Orders: Added new Referral order of Radiology Referral (Radiology) - Signed Added new Referral order of Surgical Referral (Surgery) - Signed

## 2010-06-05 ENCOUNTER — Encounter: Payer: Self-pay | Admitting: Family Medicine

## 2010-06-07 NOTE — Progress Notes (Signed)
  Phone Note From Other Clinic   Caller: Referral Coordinator Summary of Call: Made appt for breast US for 05/28/2010 at [facility] also made consult appt with Dr Lemar Livings surgeon. Their office called to say that they cancelled the [place] appt cause they will do that in their office and that the appt has been moved up and she will see Dr Lemar Livings on 05/31/2010. Initial call taken by: Carlton Adam,  May 29, 2010 8:18 AM  Follow-up for Phone Call        thank you. Ruthe Mannan MD  May 29, 2010 8:19 AM

## 2010-06-12 ENCOUNTER — Encounter: Payer: Self-pay | Admitting: Family Medicine

## 2010-06-13 NOTE — Letter (Signed)
Summary: [facility]   [facility]   Imported By: Kassie Mends 06/05/2010 08:46:38  _____________________________________________________________________  External Attachment:    Type:   Image     Comment:   External Document

## 2010-06-19 NOTE — Miscellaneous (Signed)
Summary: Orders Update   Clinical Lists Changes  Orders: Added new Referral order of Radiology Referral (Radiology) - Signed 

## 2010-06-19 NOTE — Letter (Addendum)
Summary: [facility]   [facility]   Imported By: Kassie Mends 06/12/2010 09:30:03  _____________________________________________________________________  External Attachment:    Type:   Image     Comment:   External Document  Appended Document: [facility]  suggested repeat bilateral mammogram in 6 months.  Appended Document: [facility]  Left message on voicemail at work for patient to return call.  Called home number but voicemail box was full and I could not leave a message.  Appended Document: [facility]  Advised pt.

## 2010-06-24 ENCOUNTER — Encounter: Payer: Self-pay | Admitting: Family Medicine

## 2010-07-10 NOTE — Letter (Signed)
Summary: [facility]   Imported By: Kassie Mends 07/02/2010 09:22:39  _____________________________________________________________________  External Attachment:    Type:   Image     Comment:   External Document

## 2010-09-04 NOTE — Assessment & Plan Note (Signed)
NAME:  Shelby Rios, Shelby Rios NO.:  [id number]   MEDICAL RECORD NO.:  [id number]          PATIENT TYPE:  POB   LOCATION:  [facility] at [facility]         FACILITY:  [facility]   PHYSICIAN:  Tinnie Gens, MD        DATE OF BIRTH:  1975-07-21   DATE OF SERVICE:                                  CLINIC NOTE   CHIEF COMPLAINT:  Yearly exam.   HISTORY OF PRESENT ILLNESS:  The patient is a 35 year old para 2, who is  status post TVH for endometriosis.  She has a several month history of  recurrent BV.  The patient started treatment with MetroGel couple of  days ago.  The patient reports that her pain associated with  endometriosis was rare after her TVH and her quality of life is much  improved.  The patient also reports swelling on the right side of her  arm and her leg.  She sees [place] for this.  She has had fairly  extensive workup and no one is really sure about why this happens.  Otherwise, she is without complaint.   PAST MEDICAL HISTORY:  Negative.   PAST SURGICAL HISTORY:  Transvaginal hysterectomy, tonsillectomy,  adenoidectomy, wisdom tooth extraction, and D&C.   MEDICATIONS:  She is on MetroGel 1 applicator nightly x5 days.   ALLERGIES:  None known.   SOCIAL HISTORY:  No tobacco, alcohol, or drug use.  She has 2 children  at home.   FAMILY HISTORY:  History of ovarian cancer in her mother, sister died at  age 58 of complications from elevated potassium with a long history of  increased intra-cerebral pressure.  She has a strong family history of  coronary artery disease, hypertension, diabetes, and early strokes.  The  patient underwent a BRCA testing, which was negative in May of this  year.   OBSTETRICAL HISTORY:  She has had 2 vaginal deliveries.   GYNECOLOGICAL HISTORY:  History of endometriosis.   REVIEW OF SYSTEMS:  A 14-point review of systems is reviewed.  She  denies headache, vision changes, shortness of breath, chest pain,  abdominal pain, or  dysuria.  She has occasional constipation.  She  denies any breast masses or changes and BV as reported earlier in the  HPI.   PHYSICAL EXAMINATION:  VITAL SIGNS:  Vitals are as noted in the chart.  GENERAL:  She is a well-nourished female in no acute distress.  HEENT:  Normocephalic and atraumatic.  Sclerae anicteric.  NECK:  Supple.  Normal thyroid.  LUNGS:  Clear bilaterally.  CV:  Regular rate and rhythm.  No rubs, gallops, or murmurs.  ABDOMEN:  Soft, nontender, and nondistended.  GU:  Normal external female genitalia.  BUS normal.  Vagina is pink and  rugated.  Cervix and uterus are absent.  Adnexa are without mass or  tenderness, although the ovaries cannot be felt with accuracy.   IMPRESSION:  Yearly exam.   PLAN:  CBC, CMP, lipid, and TSH today.  Self-breast check and start  annual mammograms at age 66.           ______________________________  Tinnie Gens, MD     TP/MEDQ  D:  04/07/2008  T:  04/08/2008  Job:  [id number]

## 2010-09-04 NOTE — Consult Note (Signed)
VASCULAR SURGERY CONSULTATION   Shelby Rios, FINIGAN T  DOB:  20-Mar-1976                                       03/31/2007  [id number]#:[id number]   HISTORY:  I saw the patient in the office today in consultation  concerning her right lower extremity edema.  She was referred by Dr.  Jillyn Hidden.  This is a pleasant 35 year old woman who underwent a hysterectomy  approximately 3 weeks ago.  She states that 2 weeks prior to this she  had noticed some swelling in her right hand, right arm and right leg.  She does not remember any specific injury or trauma.  She has had no  previous history of DVT or phlebitis.  Her only previous surgery has  been some laparoscopic surgery on her abdomen.  She has not had any pain  associated with the swelling and states that the swelling is actually  gradually improved over the last week.  Several years ago she had some  problems with pain in both legs at night which had resolved and she had  undergone an extensive workup at that time which was unremarkable.  This  included multiple MRIs.  She was sent for vascular evaluation given the  swelling mostly in the right leg.   PAST MEDICAL HISTORY:  Her past medical history was significant for  hypertension and hypercholesterolemia.  She denies any history of  diabetes, history of previous myocardial infarction, history of  congestive heart failure, or history of COPD.   FAMILY HISTORY:  Her mother had a stroke at age 31.  Her father had open  heart surgery at age 75.   SOCIAL HISTORY:  She is divorced.  She has 2 children.  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  She does not use  tobacco.   REVIEW OF SYSTEMS AND MEDICATIONS:  Documented on the medical history  form in her chart.   PHYSICAL EXAMINATION:  On physical examination this is a pleasant 59-  year-old woman who appears her stated age.  Blood pressure is 109/74,  heart rate is 73.  HEENT:  There is no cervical  lymphadenopathy.  I do  not detect any carotid bruits.  Lungs are clear bilaterally to  auscultation.  On cardiac exam she has a regular rate and rhythm.  Her  abdomen is soft and nontender.  I cannot appreciate any masses.  She has  no significant inguinal lymphadenopathy.  She has palpable femoral  popliteal and posterior tibial pulses bilaterally.  She has no ischemic  ulcers.  I measured her calf circumference on both sides today and could  not detect any significant difference.  She has some mild pitting edema  bilaterally.  Neurologic exam is nonfocal.   Doppler study in our office today showed no evidence of DVT in her right  lower extremity and no evidence of incompetence in the right greater  saphenous vein or deep veins.  We also evaluated the right subclavian  vein and axillary veins given the history of some arm swelling and this  too was unremarkable.   I explained to the patient that I think she did have some mild  lymphedema in the right leg and have stressed the importance of  intermittent leg elevation and also compression therapy.  I have written  her a prescription  for stockings with a 10-20 mmHg gradient.  I have  reassured her that she had no evidence of DVT or significant venous  insufficiency.  She understands this could potentially be a chronic  problem but again the treatment is simply elevation and compression.  I  would be happy to see her back at any time if any new vascular issues  arise.   Di Kindle. Edilia Bo, M.D.  Electronically Signed  CSD/MEDQ  D:  03/31/2007  T:  04/01/2007  Job:  [id number]   cc:   Billie D. Bean, FNP

## 2010-09-04 NOTE — Assessment & Plan Note (Signed)
NAME:  Shelby Rios, Shelby Rios NO.:  [id number]   MEDICAL RECORD NO.:  [id number]          PATIENT TYPE:  POB   LOCATION:  [facility] at [facility]         FACILITY:  [facility]   PHYSICIAN:  Elsie Lincoln, MD      DATE OF BIRTH:  July 12, 1975   DATE OF SERVICE:  07/16/2007                                  CLINIC NOTE   The patient 35 year old female who presents for bleeding after  intercourse.  The patient underwent a LAVH with Dr. Blima Rich in  November 2008 and there was supposedly endometriosis found.  She states  the pain is still there but not as bad as it was prior to hysterectomy  and now looking at the operative note, it says both tubes and ovaries  appeared normal without adhesive disease or endometriosis.  I am unsure  exactly what was done at the time of surgery or reason for hysterectomy.  I am trying to locate the pathology at this time.  The patient does have  bleeding after intercourse, which is not normal four months after  surgery.   PHYSICAL EXAMINATION:  ABDOMEN:  Soft, nontender, nondistended.  GENITALIA:  Tanner 5.  Vagina pink, normal rugae.  Vaginal cuff is  intact but there are two areas with granulation tissue.  Silver nitrate  was applied to these areas.  The patient tolerated this well and we will  order a transvaginal ultrasound to reevaluate her ovaries and see if  there are any cysts and I am also going to place her on continuous birth  control pills her ovaries in case there is endometriosis.  The patient  has never had a history of clots in her legs or lungs.  She does have  some swelling in her lower extremity that Dr. Mia Creek [facility] there was no evidence of clot.  Her mother  has no history of clots in her legs or lungs either.  The patient is  normotensive with a blood pressure of 113/85.  Patient to come back in  two weeks for possible more silver nitrate and to look over the  ultrasound results.  A  sample of Loestrin was given to the patient and a  three-month prescription was also given.           ______________________________  Elsie Lincoln, MD     KL/MEDQ  D:  07/16/2007  T:  07/16/2007  Job:  [id number]

## 2010-09-04 NOTE — Op Note (Signed)
NAME:  Shelby Rios, FARACI NO.:  [id number]   MEDICAL RECORD NO.:  [id number]          PATIENT TYPE:  AMB   LOCATION:  [facility]                           FACILITY:  [facility]   PHYSICIAN:  Ginger Carne, MD  DATE OF BIRTH:  1976/03/17   DATE OF PROCEDURE:  12/19/2006  DATE OF DISCHARGE:                               OPERATIVE REPORT   PREOPERATIVE DIAGNOSES:  1. Chronic pelvic pain.  2. History of endometriosis.   POSTOPERATIVE DIAGNOSES:  1. Pelvic congestion syndrome.  2. Stage I endometriosis.   OPERATIVE PROCEDURE:  Diagnostic laparoscopy.   SURGEON:  Ginger Carne, MD   ASSISTANT:  None.   COMPLICATIONS:  None immediate.   ESTIMATED BLOOD LOSS:  Minimal.   ANESTHESIA:  General.   SPECIMEN:  None.   OPERATIVE FINDINGS:  External genitalia, vulva and vagina normal.  Cervix smooth without erosions or lesions.  Uterus was normal in size,  parous.  Both tubes demonstrated presence of Filshie clips.  The patient  had pelvic congestion syndrome, greater on the left than the right, with  stage I endometriotic lesions in the posterior aspect of the broad  ligaments and uterosacral ligaments.  Appendix normal.  Large and small  bowel grossly normal.  Upper abdomen normal.  No evidence of chronic  adhesive disease.  No evidence of femoral, inguinal or obturator  hernias.   OPERATIVE PROCEDURE:  The patient prepped and draped in the usual  fashion and placed in lithotomy position.  Betadine solution used for  antiseptic and the patient was catheterized prior to procedure.  After  adequate general anesthesia, a tenaculum placed on the anterior lip of  the cervix and a Hulka tenaculum placed in same.  Afterwards a vertical  infraumbilical incision was made and the Veress needle placed in the  abdomen.  Opening and closing pressures were 10-15 mmHg.  Medial release  trocar placed in the same incision and laparoscope placed through the  trocar sleeve.  Afterwards  a 5-mm port was made in the left lower  quadrant under direct visualization.  Inspection of pelvic contents  carried out, including photographs.  Following this, gas released,  trocars removed.  Closure of 10-  mm fascia site with 0 Vicryl suture and 4-0 Vicryl for a subcuticular  closure.  Instrument and sponge count were correct.  The patient  tolerated the procedure well and returned to the post anesthesia  recovery room in excellent condition.      Ginger Carne, MD  Electronically Signed     SHB/MEDQ  D:  12/19/2006  T:  12/20/2006  Job:  [id number]

## 2010-09-04 NOTE — H&P (Signed)
NAME:  Shelby Rios, Shelby Rios NO.:  [id number]   MEDICAL RECORD NO.:  [id number]          PATIENT TYPE:  AMB   LOCATION:                                FACILITY:  [facility]   PHYSICIAN:  Ginger Carne, MD  DATE OF BIRTH:  01-10-1976   DATE OF ADMISSION:  03/09/2007  DATE OF DISCHARGE:                              HISTORY & PHYSICAL   CHIEF COMPLAINT:  Chronic pelvic pain, history of endometriosis and  dyspareunia.   HISTORY OF PRESENT ILLNESS:  This is a 35 year old gravida 3, para 2-0-1-  2 Caucasian female with a 5 to 6-year history of worsening pelvic pain  associated with dysmenorrhea and dyspareunia.  The patient has, over the  course of time, had courses of Depo Lupron for 6 months, a Mirena IUD  which had to be removed secondary to irregular bleeding and continuous  oral contraceptives with and without norethindrone acetate.  She had  originally been diagnosis with endometriosis approximately 5 years ago  and most recently had re-laparoscope in August 2008 with stage I  endometriosis.  At that time, there was no evidence of lesions on the  adnexa bilaterally.  Disease was confined to the cul-de-sac posterior  aspect of the uterus and broad ligaments.  The patient states that over  the past one year her pain has had a negative impact in her quality of  life.  She has utilized both nonsteroidal anti-inflammatory agents in  addition to narcotic analgesia.  Evaluation of the urinary  gastrointestinal or musculoskeletal systems do not reveal sources for  her pain.  Specifically, there is no evidence for interstitial cystitis,  chronic urinary tract disease or gastrointestinal symptomatology  suggestive of inherent bowel disease.  The patient has no psychological  or psychosexual dysfunction noted.  Her menses have been regular  approximately every 28-30 days, but without intermenstrual bleeding,  cramping or spotting.  CT scan of the abdomen and pelvis was also normal  for abnormal pathology including the GI and GU tracks with a normal  appendix in July 2008.   OBSTETRIC/GYNECOLOGIC HISTORY:  The patient has had two normal vaginal  deliveries and one first trimester incomplete abortion   ALLERGIES:  None.   SOCIAL HISTORY:  The patient denies tobacco use, illicit drug or alcohol  abuse.   CURRENT MEDICATIONS:  Vicodin one to two every 6-8 hours as needed for  pain   PAST SURGICAL HISTORY:  The patient has had two laparoscopies in the  past and a laparoscopic bilateral tubal ligation.   FAMILY HISTORY:  Her mother has had history of cholecystitis, stroke,  hypertension and type 2 diabetes.  Father has had myocardial infarction,  hypertension and type 2 diabetes.   REVIEW OF SYSTEMS:  A 14-point comprehensive review of systems is  negative.   MEDICAL HISTORY:  The patient has had a history of kidney stones in the  past.   PHYSICAL EXAMINATION:  VITAL SIGNS:  Blood pressure is 126/80, weight  160 pounds, height 5 feet 5 inches.  HEENT: Grossly normal.  BREASTS:  Exam without masses, discharge,  thickenings or tenderness.  CHEST:  Clear to percussion and auscultation.  CARDIOVASCULAR:  Without murmurs or enlargements.  Regular rate and  rhythm.  EXTREMITY/LYMPHATIC/SKIN/NEUROLOGICAL/MUSCULOSKELETAL:  Systems all  within normal limits.  ABDOMEN:  Soft without gross hepatosplenomegaly.  PELVIC:  Normal Pap smear.  EXTERNAL GENITALIA: Vulva and vagina normal.  Cervix smooth without  erosions or lesions.  Uterus small, anteverted and flexed and both  adnexa palpable, found to be normal.  RECTAL:  Exam is negative for masses or lesions.   IMPRESSION:  Is chronic pelvic pain stage I endometriosis with  associated dysmenorrhea and dyspareunia.   PLAN:  The patient has no desire for further childbearing, has had a  tubal ligation.  She has had an induced amenorrhea with Depo Lupron  failure on oral contraceptives on a continual basis in addition  to  chronic analgesic medication.  The patient states that her discomfort  has had a negative impact in her quality of life, and she has no known  psychological risks or medical risks that would outweigh the benefits of  said surgery.  For this reason, the patient will undergo a total vaginal  hysterectomy with preservation of both tubes and ovaries.  The patient  understands the nature of said procedure and also understands that there  is a 40% chance of recurrent pelvic pain including possible removal of  one or both adnexa as a result of persistent dyspareunia or pelvic pain.  The patient is willing to accept this risks to avoid the need for  hormone-replacement therapy.  Risks including possible injuries to  ureter, bowel and bladder, possible conversion to an open laparoscopic  procedure, hemorrhage possibly requiring blood transfusion, infection  and other unforeseen complications were discussed and understood by said  patient.      Ginger Carne, MD  Electronically Signed     SHB/MEDQ  D:  03/04/2007  T:  03/05/2007  Job:  [id number]

## 2010-09-04 NOTE — Assessment & Plan Note (Signed)
NAME:  Shelby Rios, Shelby Rios NO.:  [id number]   MEDICAL RECORD NO.:  [id number]          PATIENT TYPE:  POB   LOCATION:  [facility]         FACILITY:  [facility]   PHYSICIAN:  Elsie Lincoln, MD      DATE OF BIRTH:  04-Oct-1975   DATE OF SERVICE:  08/06/2007                                  CLINIC NOTE   The patient is a 35 year old female who presents for followup of pain.  She is status post hysterectomy with ovaries still in situ.  She was  placed on continuous Elestrin and is doing better with this, still has  some cramping.  She also was found to have granulation tissue at her  vaginal vault and she received silver nitrate at her last visit.  Now  the granulation tissue has decreased but there still are two areas that  need more silver nitrate and this was applied today.  The patient is to  go for followup ultrasound to see if she has any ovarian issues which  cause the cramping and this was ordered today.  The patient is to come  back in 2-3 weeks for reapplication of silver nitrate.           ______________________________  Elsie Lincoln, MD     KL/MEDQ  D:  08/06/2007  T:  08/06/2007  Job:  [id number]

## 2010-09-04 NOTE — Assessment & Plan Note (Signed)
NAME:  NA, WALDRIP NO.:  [id number]   MEDICAL RECORD NO.:  [id number]          PATIENT TYPE:  POB   LOCATION:  [facility]         FACILITY:  [facility]   PHYSICIAN:  Elsie Lincoln, MD      DATE OF BIRTH:  04-18-1976   DATE OF SERVICE:  09/10/2007                                  CLINIC NOTE   HISTORY:  The patient is a 35 year old female who presents for  application of silver nitrate status post hysterectomy.  She is no  longer having bleeding after sex anymore.  She does agree to have brc-1  and 2 testing, concern for her mother's history of ovarian cancer.  The  mother was slightly reluctant.  They have undergone a family trauma  recently and she does not feel like she wants to ask her mother to get  tested again.  She does want to be tested for the sake of her health and  her daughter's health.  Her sister died on 2022/09/10 from cerebral  papilledema and what sounds to be hypokalemia.  The patient seems to be  adjusting well, although does have a slightly depressed affect with this  and also with the ovarian cancer.   DISCHARGE PHYSICAL EXAM:  VITAL SIGNS:  Blood pressure 118/71, pulse 88,  weight 171 pounds, height 5 feet 5 inches.  PELVIC EXAM:  Vagina pink.  No rugae.  Granulation tissue in the right  corner of the vagina.  Silver nitrate was applied to this.  This should  be the last treatment necessary.   ASSESSMENT/PLAN:  13. A 35 year old female for silver nitrate application, which was      done.  Variceal on testing.  2. Return to the clinic in two to three weeks for the results.           ______________________________  Elsie Lincoln, MD     KL/MEDQ  D:  09/10/2007  T:  09/10/2007  Job:  [id number]

## 2010-09-04 NOTE — Discharge Summary (Signed)
NAME:  Shelby Rios, Shelby Rios NO.:  [id number]   MEDICAL RECORD NO.:  [id number]          PATIENT TYPE:  OIB   LOCATION:  9312                          FACILITY:  [facility]   PHYSICIAN:  Ginger Carne, MD  DATE OF BIRTH:  Feb 19, 1976   DATE OF ADMISSION:  03/09/2007  DATE OF DISCHARGE:                               DISCHARGE SUMMARY   REASON FOR HOSPITALIZATION:  1. Severe dysmenorrhea.  2. Dyspareunia.  3. Endometriosis.   HOSPITAL PROCEDURE:  Total vaginal hysterectomy with preservation of  both tubes and ovaries.   FINAL DIAGNOSES:  1. Severe dysmenorrhea.  2. Dyspareunia.  3. Endometriosis.   HOSPITAL COURSE:  This is a 35 year old Caucasian female multiparous,  who underwent the aforementioned procedure on March 09, 2007.  The  patient's intraoperative course was unremarkable.  Postoperatively, she  did well.  Her postoperative hemoglobin was 11.1, hematocrit 32.7,  creatinine 0.6.  Abdomen was soft.  Scant vaginal flow.  Calves without  tenderness.  Lungs clear.  Abdomen soft.   DISCHARGE INSTRUCTIONS:  1. The patient was discharged with routine postoperative instructions      including contacting the office for temperature elevation above      100.4 degrees Fahrenheit, increasing abdominal or vaginal pain,      vaginal bleeding, GI or GU complaints.  2. Our office will contact Everrett Coombe, FNP to arrange a cardiology      consult because of right-sided upper and lower extremity swelling      over the past month.  The patient has no symptoms of exertional      angina or angina at rest.  It is uncertain as to the etiology of      this swelling.  However, the patient states she has had a 12 pound      weight gain and and strong family history of CAHD.  3. In addition, the patient will return in 4 weeks for followup at the      [facility] office.  All questions were answered to the      satisfaction of the patient.  The patient verbalized understanding    of same.   DISCHARGE MEDICATIONS:  1. She was discharged with hydrochlorothiazide 50 mg 1 daily.  2. K-Dur 20 mEq daily.  3. Percocet 5/325 1-2 every 4-6 hours.     Ginger Carne, MD  Electronically Signed    SHB/MEDQ  D:  03/10/2007  T:  03/10/2007  Job:  [id number]

## 2010-09-04 NOTE — Procedures (Signed)
DUPLEX DEEP VENOUS EXAM - LOWER EXTREMITY   INDICATION:  Right leg edema.   HISTORY:  Edema:  Right leg  Trauma/Surgery:  No  Pain:  No  PE:  No  Previous DVT:  No  Anticoagulants:  No  Other:  The patient is also complaining of some swelling in her right  arm.   DUPLEX EXAM:                CFV   SFV   PopV  PTV    GSV                R  L  R  L  R  L  R   L  R  L  Thrombosis    0  0  0     0     0      0  Spontaneous   +  +  +     +     +      +  Phasic        +  +  +     +     +      +  Augmentation  +  +  +     +     +  Compressible  +  +  +     +     +  Competent     +  +  +     +     +      +   Legend:  + - yes  o - no  p - partial  D - decreased   IMPRESSION:  1. No evidence of right lower extremity deep venous thrombosis.  2. Right greater saphenous vein is competent.  3. Right brachial, subclavian, axillary and internal jugular veins are      patent.  4. Both posterior tibial artery signals are within normal limits.    _____________________________  Di Kindle. Edilia Bo, M.D.   DP/MEDQ  D:  03/31/2007  T:  04/01/2007  Job:  [id number]

## 2010-09-04 NOTE — Op Note (Signed)
NAME:  Shelby Rios, GATHRIGHT NO.:  [id number]   MEDICAL RECORD NO.:  [id number]          PATIENT TYPE:  OIB   LOCATION:  9312                          FACILITY:  [facility]   PHYSICIAN:  Ginger Carne, MD  DATE OF BIRTH:  May 05, 1975   DATE OF PROCEDURE:  03/09/2007  DATE OF DISCHARGE:                               OPERATIVE REPORT   PREOPERATIVE DIAGNOSES:  Endometriosis, dyspareunia.   POSTOPERATIVE DIAGNOSES:  Endometriosis, dyspareunia.   PROCEDURE:  Total vaginal hysterectomy with preservation of both tubes  and ovaries.   SURGEON:  Ginger Carne, MD.   ASSISTANT:  Allie Bossier, MD.   ESTIMATED BLOOD LOSS:  Less than 75 mL.   COMPLICATIONS:  None immediate.   SPECIMENS:  Uterus and cervix to pathology.   ANESTHESIA:  General.   OPERATIVE FINDINGS:  External genitalia, vulva and vagina normal. Cervix  smooth without erosions or lesions. The uterus and cervix appeared  normal in size and contour. Both tubes and ovaries appeared normal  without adhesive disease or endometriosis.   DESCRIPTION OF PROCEDURE:  The patient prepped and draped in the usual  fashion and placed in the lithotomy position. Betadine solution used for  antiseptic and the patient was catheterized prior to the procedure.  After adequate general anesthesia, a double tooth tenaculum placed in  the anterior and posterior lips of the cervix. Marcaine with epinephrine  used as a paracervical block. 2 cm of anterior and posterior vaginal  epithelium was incised transversely. The peritoneal reflection was  identified and opened without injury to the respective organs.  Afterwards in a standard Richardson fashion, the uterine vasculature was  clamped, cut, and ligated with #0 Vicryl suture including the ascending  branches of the uterine vasculature and broad ligaments. At this point,  the uteroovarian ligaments bilaterally were clamped, cut, and ligated  with #0 transfixation Vicryl sutures  x2. Bleeding  points hemostatically checked, blood clots removed, closure of the cuff  in one layer with #0 Vicryl running interlocking suture. Bleeding points  hemostatically checked, blood clots removed. The patient tolerated the  procedure well and was transported to post anesthesia recovery room in  excellent condition.      Ginger Carne, MD  Electronically Signed     SHB/MEDQ  D:  03/09/2007  T:  03/09/2007  Job:  [id number]

## 2010-09-07 NOTE — Assessment & Plan Note (Signed)
NAME:  Shelby Rios, Shelby Rios NO.:  [id number]   MEDICAL RECORD NO.:  [id number]          PATIENT TYPE:  POB   LOCATION:  [facility]         FACILITY:  [facility]   PHYSICIAN:  Tinnie Gens, MD        DATE OF BIRTH:  1975-05-13   DATE OF SERVICE:                                    CLINIC NOTE   DATE OF VISIT:  December 17, 2005   CHIEF COMPLAINT:  Detailed work up.   HISTORY OF PRESENT ILLNESS:  The patient is a 35 year old para 2 who has a  Mirena IUD in.  Since the insertion she continues to have break-through  bleeding as well as persistent bacterial vaginosis.  The patient really does  not want to continue having to put up with this bleeding and these recurrent  bacterial infections.  She has no other reason and she has not changed any  other habits since her IUD was inserted.  The patient has consulted with her  partner and they have decided that they don't want any more children.   PAST MEDICAL HISTORY:  Negative.   PAST SURGICAL HISTORY:  Tonsils and adenoids, wisdom teeth extraction and a  D and C.   MEDICATIONS:  None.   ALLERGIES:  None known.   SOCIAL HISTORY:  No tobacco, alcohol or drug use.   FAMILY HISTORY:  Ovarian cancer.  Coronary artery disease.  Hypertension.  Diabetes. Strokes.   OBSTETRICAL HISTORY:  She is a para 2 with two vaginal deliveries.  Her GYN  history is negative.   PHYSICAL EXAMINATION:  VITAL SIGNS:  As on the chart.  GENERAL APPEARANCE:  She is a well-developed, well-nourished female in no  acute distress.  NECK:  Supple with normal thyroid.  LUNGS:  Clear bilaterally.  CARDIOVASCULAR:  Regular rate and rhythm with no murmurs, rubs or gallops.  ABDOMEN:  Soft, nontender, nondistended.  No scars noted.  EXTREMITIES:  No cyanosis, clubbing or edema.  2+ distal pulses.   IMPRESSION:  1. Multiparity and undesired fertility.  2. Intolerant of the Mirena IUD.   PLAN:  Bilateral tubal ligation and IUD removal.  Will  schedule this for  Friday if possible.           ______________________________  Tinnie Gens, MD     TP/MEDQ  D:  12/17/2005  T:  12/18/2005  Job:  [id number]

## 2010-09-07 NOTE — Op Note (Signed)
NAME:  Shelby Rios, Shelby Rios                 ACCOUNT NO.:  [id number]   MEDICAL RECORD NO.:  [id number]          PATIENT TYPE:  AMB   LOCATION:  SDC                           FACILITY:  [facility]   PHYSICIAN:  Tracy L. Mayford Knife, M.D.DATE OF BIRTH:  April 08, 1976   DATE OF PROCEDURE:  01/24/2006  DATE OF DISCHARGE:                                 OPERATIVE REPORT   PREOPERATIVE DIAGNOSES:  1. Desire for permanent sterilization.  2. Multiparity.  3. Desire for intrauterine device removal.   POSTOPERATIVE DIAGNOSES:  1. Desire for permanent sterilization.  2. Multiparity.  3. Desire for intrauterine device removal.   PROCEDURE:  1. Laparoscopic bilateral tubal ligation with Filshie clips.  2. Intrauterine device removal.   EBL:  Minimal.   ANESTHESIA:  General 5 mL with 0.25% Marcaine.   URINE OUTPUT:  Clear.  Less than 100 mL prior to procedure.   COMPLICATIONS:  None.   SPECIMENS:  None.   DESCRIPTION OF PROCEDURE:  Patient is a 35 year old G2 who desires permanent  sterilization.  Informed consent was obtained.  She was taken to the OR  where she was prepped and draped in the usual sterile fashion.  Speculum was  placed, IUD was removed without difficulty.  Tenaculum was placed on the  anterior lip of the cervix.  The head portion extended into the cervix which  allowed [place] to be able to manipulate the uterus for better visualization  during the procedure.  Next, attention was turned to the abdomen where an  approximate 1.5 cm incision was made in the umbilicus vertically and carried  down through the peritoneum.  Suture was placed to stabilize the fascia.  Then a trocar was placed and abdomen was insufflated.  Inspection of the  pelvis revealed normal uterus, tubes and ovaries.  The right tube was  grasped in the mid portion and Filshie clip was placed completely occluding  the tube.  Attention was then turned to the left side and in a similar  fashion the tube was grasped and the Filshie  clip placed.  Excellent  hemostasis was obtained throughout the procedure.  The trocar was  removed.  The fascia was closed with figure-of-eights until there was no  defect felt.  Skin was closed with Vicryl.  The patient tolerated the  procedure well.  The tenaculum was removed prior to her being transferred to  the recovery room.           ______________________________  Marc Morgans Mayford Knife, M.D.     TLW/MEDQ  D:  01/24/2006  T:  01/24/2006  Job:  [id number]

## 2010-09-07 NOTE — H&P (Signed)
NAME:  Shelby Rios, Shelby Rios NO.:  [id number]   MEDICAL RECORD NO.:  [id number]          PATIENT TYPE:  AMB   LOCATION:  [facility]                    [facility]  [facility]   PHYSICIAN:  Tinnie Gens, MD        DATE OF BIRTH:  [birth year]   DATE OF SERVICE:                             PRE-OP HISTORY & PHYSICAL   CHIEF COMPLAINT:  Undesired fertility.   HISTORY OF PRESENT ILLNESS:  The patient is a 35 year old para 2 who has a  Mirena IUD, who continues to have breakthrough bleeding and recurrent  bacterial vaginosis.  The patient has decided that she would like to have  permanent sterilization.  She and her partner have discussed this, and they  do not want any more children.   PAST MEDICAL HISTORY:  Negative.   PAST SURGICAL HISTORY:  1. Tonsillectomy and adenoidectomy.  2. Wisdom teeth extraction.  3. D&C.   MEDICATIONS:  None.   ALLERGIES:  None known.   SOCIAL HISTORY:  No tobacco, alcohol, or drug use.   FAMILY HISTORY:  Ovarian cancer, coronary artery disease, hypertension,  diabetes, and strokes.   OB HISTORY:  She is a para 2, with 2 vaginal deliveries.   GYN HISTORY:  Negative.   REVIEW OF SYSTEMS:  Negative.   PHYSICAL EXAMINATION:  VITAL SIGNS:  Blood pressure 118/80, pulse 68, weight  180, height 5 feet 5 inches.  GENERAL:  She is a well-developed, well-nourished female in no acute  distress.  NECK:  Supple.  Normal thyroid.  LUNGS:  Clear bilaterally.  CARDIOVASCULAR:  Regular rate and rhythm.  No rubs, gallops,or murmurs.  ABDOMEN: Soft, nontender, nondistended.  EXTREMITIES:  No cyanosis, clubbing, or edema.  2+ distal pulses.   IMPRESSION:  1. Multiparity, undesired fertility.  2. Intolerant of Mirena intrauterine device.   PLAN:  BTL and IUD removal concomitantly.           ______________________________  Tinnie Gens, MD     TP/MEDQ  D:  01/14/2006  T:  01/15/2006  Job:  [id number]

## 2010-11-02 ENCOUNTER — Encounter: Payer: Self-pay | Admitting: Cardiovascular Disease

## 2010-11-08 ENCOUNTER — Telehealth: Payer: Self-pay | Admitting: *Deleted

## 2010-11-08 NOTE — Telephone Encounter (Signed)
Left message on cell phone voicemail advising patient that forms are ready for pick up.  Will be left at front desk.

## 2010-11-08 NOTE — Telephone Encounter (Signed)
Pt has brought in a list of her immunizations and requested that we put them on a form for [employer].  Form needs to be signed and is on your desk.

## 2010-11-08 NOTE — Telephone Encounter (Signed)
Form signed, on my desk.

## 2011-01-29 LAB — BASIC METABOLIC PANEL
BUN: 4 — ABNORMAL LOW
BUN: 10
CO2: 32
CO2: 31
Calcium: 8.9
Calcium: 9.2
Chloride: 103
Chloride: 103
Creatinine, Ser: 0.6
Creatinine, Ser: 0.58
GFR calc Af Amer: >60
GFR calc Af Amer: >60
GFR calc non Af Amer: >60
GFR calc non Af Amer: >60
Glucose, Bld: 102 — ABNORMAL HIGH
Glucose, Bld: 86
Potassium: 3.8
Potassium: 4.2
Sodium: 140
Sodium: 140

## 2011-01-29 LAB — CBC
HCT: 32.7 — ABNORMAL LOW
HCT: 36.5
Hemoglobin: 11.1 — ABNORMAL LOW
Hemoglobin: 12.3
MCHC: 33.9
MCHC: 33.7
MCV: 86.1
MCV: 85.4
Platelets: 285
Platelets: 277
RBC: 3.8 — ABNORMAL LOW
RBC: 4.28
RDW: 13.3
RDW: 13.2
WBC: 10.4
WBC: 5.4

## 2011-01-29 LAB — HCG, SERUM, QUALITATIVE: Preg, Serum: NEGATIVE

## 2011-02-01 LAB — HCG, SERUM, QUALITATIVE: Preg, Serum: NEGATIVE

## 2011-02-01 LAB — CBC
HCT: 38.4
Hemoglobin: 12.9
MCHC: 33.6
MCV: 86.7
Platelets: 286
RBC: 4.43
RDW: 13.5
WBC: 6.7

## 2011-02-01 LAB — BASIC METABOLIC PANEL
BUN: 12
CO2: 32
Calcium: 9.4
Chloride: 102
Creatinine, Ser: 0.56
GFR calc Af Amer: >60
GFR calc non Af Amer: >60
Glucose, Bld: 90
Potassium: 3.9
Sodium: 140

## 2011-04-18 ENCOUNTER — Telehealth: Payer: Self-pay | Admitting: *Deleted

## 2011-04-18 NOTE — Telephone Encounter (Signed)
Patient stated that her daughter was seen yesterday and was diagnosed with the flu.  She wanted to know if Dr. Dayton Martes will call her in Tamiflu because now she is starting to feel bad.  Uses [facility].  Please advise.

## 2011-04-19 MED ORDER — OSELTAMIVIR PHOSPHATE 75 MG PO CAPS: 75.0000 mg | ORAL_CAPSULE | Freq: Two times a day (BID) | ORAL | Status: AC

## 2011-04-19 NOTE — Telephone Encounter (Signed)
Rx sent 

## 2011-04-19 NOTE — Telephone Encounter (Signed)
Left message on cell phone voicemail advising patient that Rx was sent to pharmacy.

## 2011-05-28 ENCOUNTER — Ambulatory Visit: Payer: Self-pay | Admitting: General Surgery

## 2011-08-21 ENCOUNTER — Telehealth: Payer: Self-pay | Admitting: Family Medicine

## 2011-08-21 NOTE — Telephone Encounter (Signed)
Patient is coming in for her cpx on 09/17/11.  Patient needs a rx for her lab work.  Patient works for [employer].  Patient said she'll pick rx up when it's ready.

## 2011-08-22 NOTE — Telephone Encounter (Signed)
Rx written and on my desk for pick up.

## 2011-09-13 ENCOUNTER — Encounter: Payer: Self-pay | Admitting: Family Medicine

## 2011-09-17 ENCOUNTER — Ambulatory Visit (INDEPENDENT_AMBULATORY_CARE_PROVIDER_SITE_OTHER): Payer: 59 | Admitting: Family Medicine

## 2011-09-17 ENCOUNTER — Encounter: Payer: Self-pay | Admitting: Family Medicine

## 2011-09-17 ENCOUNTER — Ambulatory Visit (INDEPENDENT_AMBULATORY_CARE_PROVIDER_SITE_OTHER)
Admission: RE | Admit: 2011-09-17 | Discharge: 2011-09-17 | Disposition: A | Discharge status: Home / Self Care | Payer: 59 | Source: Ambulatory Visit | Attending: Family Medicine | Admitting: Family Medicine

## 2011-09-17 VITALS — BP 110/72 | HR 68 | Temp 99.6°F | Ht 65.75 in | Wt 199.0 lb

## 2011-09-17 DIAGNOSIS — M545 Low back pain, unspecified: Secondary | ICD-10-CM | POA: Insufficient documentation

## 2011-09-17 DIAGNOSIS — F341 Dysthymic disorder: Secondary | ICD-10-CM

## 2011-09-17 DIAGNOSIS — Z0000 Encounter for general adult medical examination without abnormal findings: Secondary | ICD-10-CM

## 2011-09-17 DIAGNOSIS — M546 Pain in thoracic spine: Secondary | ICD-10-CM

## 2011-09-17 MED ORDER — BUPROPION HCL ER (SR) 150 MG PO TB12: 150.0000 mg | ORAL_TABLET | Freq: Two times a day (BID) | ORAL | Status: DC

## 2011-09-17 NOTE — Progress Notes (Signed)
36 yo here for CPX.   Lipid panel and CMET excellent ([facility] scanned labs).  S/p hysterectomy. Had mammogram in 05/2011- neg. BRCA neg (two aunts had ovarian CA).  Weight gain- has continued to gain weight over the last several years. Weight 175 in 07/2007, was 165 in 2008.  Stopped exercising.  She knows she needs to restart.   Depression- since her hysterectomy has had some decreased sex drive (still has her ovaries). Always had some issues with anxiety and depression.  Wellbutrin helped, cannot remember why she stopped taking it.  Right sided thoracic discomfort- states she can feel it after sitting for long periods of time. Not really a "pain, " more of a discomfort.  Getting up and walking around makes her feel better. No back pain or SOB. No LE radiculopathy.     Patient Active Problem List  Diagnoses  . DEPRESSION/ANXIETY  . DYSPEPSIA  . BACK PAIN  . FATIGUE  . DEPENDENT EDEMA  . WEIGHT GAIN  . PALPITATIONS  . NONSPECIFIC ABNORM RESULTS OTH SPEC FUNCT STUDY  . BREAST PAIN, RIGHT  . Abnormal mammogram, unspecified  . Routine general medical examination at a health care facility   Past Medical History  Diagnosis Date  . Palpitations   . Fatigue   . Dyspepsia   . Back pain   . Depression   . Anxiety   . Breast pain, right   . Edema   . Abnormal mammogram, unspecified   . Nonspecific abnormal results of other specified function study   . Routine general medical examination at a health care facility   . Abnormal weight gain    Past Surgical History  Procedure Date  . Partial hysterectomy 2008    endometriosis  . Tonsillectomy   . Adenoidectomy   . Wisdom tooth extraction   . Dilation and curettage of uterus   . Brca test     neg   History  Substance Use Topics  . Smoking status: Never Smoker   . Smokeless tobacco: Not on file   Comment: nonsmoker  . Alcohol Use: Not on file   Family History  Problem Relation Age of Onset  . Ovarian cancer Mother    . Stroke Mother 21  . Cancer Mother     ovarian  . Hypokalemia Sister     deceased at 34; was getting regular IV K infusions; long Hx of pseudotumor cerebri  . Heart attack      GF - 35  . Coronary artery disease      family hx  . Hypertension      family hx  . Diabetes      family hx  . Heart disease Father     CABG age 84   Allergies not on file Current Outpatient Prescriptions on File Prior to Visit  Medication Sig Dispense Refill  . buPROPion (WELLBUTRIN SR) 150 MG 12 hr tablet Take 150 mg by mouth 2 (two) times daily.        Marland Kitchen ibuprofen (ADVIL,MOTRIN) 200 MG tablet Take 200 mg by mouth every 6 (six) hours as needed. UAD       . metoprolol tartrate (LOPRESSOR) 25 MG tablet Take 25 mg by mouth 2 (two) times daily as needed.        The PMH, PSH, Social History, Family History, Medications, and allergies have been reviewed in [facility], and have been updated if relevant.   Review of Systems  See HPI  Patient reports no  vision/  hearing changes,anorexia, weight change, fever ,adenopathy, persistant / recurrent hoarseness, swallowing issues, chest pain, edema,persistant / recurrent cough, hemoptysis, dyspnea(rest, exertional, paroxysmal nocturnal), gastrointestinal  bleeding (melena, rectal bleeding), abdominal pain, excessive heart burn, GU symptoms(dysuria, hematuria, pyuria, voiding/incontinence  Issues) syncope, focal weakness, severe memory loss, concerning skin lesions, depression, anxiety, abnormal bruising/bleeding, major joint swelling, breast masses or abnormal vaginal bleeding.     Physical Exam  BP 110/72  Pulse 68  Temp 99.6 F (37.6 C)  Ht 5' 5.75" (1.67 m)  Wt 199 lb (90.266 kg)  BMI 32.36 kg/m2 Wt Readings from Last 3 Encounters:  09/17/11 199 lb (90.266 kg)  05/21/10 193 lb 8 oz (87.771 kg)  02/05/10 189 lb (85.73 kg)     General: alert, well-developed, well-nourished, well-hydrated, and overweight-appearing. NAD  weight stable  Head: normocephalic and  atraumatic.  Eyes: vision grossly intact, pupils equal, and pupils round.  Ears: R ear normal and L ear normal.  Nose: no external deformity.  Mouth: good dentition.  Neck: No deformities, masses, or tenderness noted.  Breasts: No mass, nodules, thickening, tenderness, bulging, retraction, inflamation, nipple discharge or skin changes noted.  Lungs: Normal respiratory effort, chest expands symmetrically. Lungs are clear to auscultation, no crackles or wheezes.  Heart: normal rate, regular rhythm, and no murmur.  Abdomen: Bowel sounds positive,abdomen soft and non-tender without masses, organomegaly or hernias noted.no guarding, no rigidity, and no rebound tenderness.  Genitalia: Pelvic Exam:  External: normal female genitalia without lesions or masses  Vagina: normal without lesions or masses  Adnexa: normal bimanual exam without masses or fullness  Pap smear: not performed  Msk: normal ROM, no TTP over thoracic spine or parapsinous muscles Extremities: no edema either lower legs  Neurologic: alert & oriented X3 and gait normal.  Skin: Intact without suspicious lesions or rashes  Psych: Cognition and judgment appear intact. Alert and cooperative with normal attention span and concentration. No apparent delusions, illusions, hallucinations   Assessment and Plan: 1. Routine general medical examination at a health care facility  Reviewed preventive care protocols, scheduled due services, and updated immunizations Discussed nutrition, exercise, diet, and healthy lifestyle.    2. DEPRESSION/ANXIETY  Deteriorated. Restart Wellbutrin.   3. Right low back pain  New- seems consistent with nerve impingement.  Will check Thoracic film. The patient indicates understanding of these issues and agrees with the plan.  DG Thoracic Spine W/Swimmers

## 2011-09-17 NOTE — Patient Instructions (Signed)
Good to see you. Try taking the Align for one week. We will call you with results of your xray from today.

## 2011-09-17 NOTE — Progress Notes (Signed)
Addended by: Dianne Dun on: 09/17/2011 01:06 PM   Modules accepted: Orders

## 2011-09-20 ENCOUNTER — Ambulatory Visit (INDEPENDENT_AMBULATORY_CARE_PROVIDER_SITE_OTHER)
Admission: RE | Admit: 2011-09-20 | Discharge: 2011-09-20 | Disposition: A | Discharge status: Home / Self Care | Payer: 59 | Source: Ambulatory Visit | Attending: Family Medicine | Admitting: Family Medicine

## 2011-09-20 DIAGNOSIS — M546 Pain in thoracic spine: Secondary | ICD-10-CM

## 2011-12-24 ENCOUNTER — Encounter: Payer: Self-pay | Admitting: Family Medicine

## 2011-12-24 ENCOUNTER — Ambulatory Visit (INDEPENDENT_AMBULATORY_CARE_PROVIDER_SITE_OTHER): Payer: 59 | Admitting: Family Medicine

## 2011-12-24 VITALS — BP 124/90 | HR 84 | Temp 99.0°F | Wt 200.0 lb

## 2011-12-24 DIAGNOSIS — R39198 Other difficulties with micturition: Secondary | ICD-10-CM

## 2011-12-24 DIAGNOSIS — R450 Nervousness: Secondary | ICD-10-CM

## 2011-12-24 DIAGNOSIS — R5381 Other malaise: Secondary | ICD-10-CM

## 2011-12-24 DIAGNOSIS — R5383 Other fatigue: Secondary | ICD-10-CM | POA: Insufficient documentation

## 2011-12-24 DIAGNOSIS — R3919 Other difficulties with micturition: Secondary | ICD-10-CM

## 2011-12-24 LAB — POCT URINALYSIS DIPSTICK
Bilirubin, UA: NEGATIVE
Leukocytes, UA: NEGATIVE
Nitrite, UA: NEGATIVE
Protein, UA: NEGATIVE
Urobilinogen, UA: NEGATIVE
pH, UA: 7.5

## 2011-12-24 NOTE — Patient Instructions (Addendum)
We will call with lab results.  Let us know if symptoms changing. Make sure to eat 3 meals daily with snacks in between, protein with each meal to slow absorption of sugar. Avoid high carbohydrate foods and drinks such as soda, OJ.

## 2011-12-24 NOTE — Assessment & Plan Note (Addendum)
Will eval with labs. No glucose in urine or infection. Push fluids.  Discussed with pt to eat healthy diet, three meals a day woith snacks, low carb. Avoid concentrated sweets.

## 2011-12-24 NOTE — Progress Notes (Signed)
  Subjective:    Patient ID: Shelby Rios, female    DOB: 12-23-1975, 36 y.o.   MRN: [id number]  HPI  36 year old female with history of depression/anxiety presents with 5 days of intermittant morning jitteriness and shakiness. This occurs despite regular meal (had eggs and abcon this AM), about 1 hour later she  She feels better with drinking OJ, but still feels clammy and sweaty and tired.  She feels like she needs to eat every 2 hours to avoid feeling shaky.  No chest pain, no SOB. No fever, no dysuria or stool changes. She has noted darker concentrated urine for a while now. She does have some dry mouth. No flulike symptoms.  Has history of palpitation at night at times, no change.   No change in meds, no new OTC meds.  Family history: parents with DM, no thyroid issues.  She denies any onogoing anxiety issues. Denies panic attacks currently.       Review of Systems  Constitutional: Negative for fever and fatigue.  HENT: Negative for ear pain.   Eyes: Negative for pain.  Respiratory: Negative for chest tightness and shortness of breath.   Cardiovascular: Negative for chest pain, palpitations and leg swelling.  Gastrointestinal: Negative for abdominal pain.  Genitourinary: Negative for dysuria.       Objective:   Physical Exam  Constitutional: Vital signs are normal. She appears well-developed and well-nourished. She is cooperative.  Non-toxic appearance. She does not appear ill. No distress.  HENT:  Head: Normocephalic.  Right Ear: Hearing, tympanic membrane, external ear and ear canal normal. Tympanic membrane is not erythematous, not retracted and not bulging.  Left Ear: Hearing, tympanic membrane, external ear and ear canal normal. Tympanic membrane is not erythematous, not retracted and not bulging.  Nose: No mucosal edema or rhinorrhea. Right sinus exhibits no maxillary sinus tenderness and no frontal sinus tenderness. Left sinus exhibits no maxillary sinus  tenderness and no frontal sinus tenderness.  Mouth/Throat: Uvula is midline, oropharynx is clear and moist and mucous membranes are normal.  Eyes: Conjunctivae, EOM and lids are normal. Pupils are equal, round, and reactive to light. No foreign bodies found.  Neck: Trachea normal and normal range of motion. Neck supple. Carotid bruit is not present. No mass and no thyromegaly present.  Cardiovascular: Normal rate, regular rhythm, S1 normal, S2 normal, normal heart sounds, intact distal pulses and normal pulses.  Exam reveals no gallop and no friction rub.   No murmur heard. Pulmonary/Chest: Effort normal and breath sounds normal. Not tachypneic. No respiratory distress. She has no decreased breath sounds. She has no wheezes. She has no rhonchi. She has no rales.  Abdominal: Soft. Normal appearance and bowel sounds are normal. There is no tenderness.  Neurological: She is alert.  Skin: Skin is warm, dry and intact. No rash noted.  Psychiatric: Her speech is normal and behavior is normal. Judgment and thought content normal. Her mood appears not anxious. Cognition and memory are normal. She does not exhibit a depressed mood.          Assessment & Plan:

## 2011-12-25 LAB — COMPREHENSIVE METABOLIC PANEL
ALT: 22 IU/L (ref 0–32)
Albumin/Globulin Ratio: 1.4 (ref 1.1–2.5)
Calcium: 8.9 mg/dL (ref 8.7–10.2)
Creatinine, Ser: 0.6 mg/dL (ref 0.57–1.00)
GFR calc non Af Amer: 118 mL/min/{1.73_m2} (ref >59)
Glucose: 85 mg/dL (ref 65–99)
Potassium: 4.2 mmol/L (ref 3.5–5.2)
Total Protein: 7.4 g/dL (ref 6.0–8.5)

## 2011-12-25 LAB — CBC WITH DIFFERENTIAL/PLATELET
Basophils Absolute: 0 10*3/uL (ref 0.0–0.2)
Basos: 0 % (ref 0–3)
Eos: 0 % (ref 0–7)
Hemoglobin: 13.1 g/dL (ref 11.1–15.9)
Lymphs: 20 % (ref 14–46)
Monocytes: 8 % (ref 4–13)
Neutrophils Absolute: 7 10*3/uL (ref 1.8–7.8)
RBC: 4.62 x10E6/uL (ref 3.77–5.28)
WBC: 9.6 10*3/uL (ref 4.0–10.5)

## 2011-12-25 LAB — VITAMIN B12: Vitamin B-12: 341 pg/mL (ref 211–946)

## 2011-12-25 LAB — TSH: TSH: 1.28 u[IU]/mL (ref 0.450–4.500)

## 2011-12-25 LAB — HEMOGLOBIN A1C: Hgb A1c MFr Bld: 5.3 % (ref 4.8–5.6)

## 2012-06-02 ENCOUNTER — Ambulatory Visit: Payer: Self-pay | Admitting: General Surgery

## 2012-06-09 ENCOUNTER — Ambulatory Visit: Payer: Self-pay | Admitting: General Surgery

## 2012-08-09 ENCOUNTER — Emergency Department (HOSPITAL_COMMUNITY)
Admission: EM | Admit: 2012-08-09 | Discharge: 2012-08-09 | Disposition: A | Discharge status: Home / Self Care | Payer: Self-pay | Source: Home / Self Care

## 2013-03-04 ENCOUNTER — Ambulatory Visit (INDEPENDENT_AMBULATORY_CARE_PROVIDER_SITE_OTHER): Payer: 59 | Admitting: Internal Medicine

## 2013-03-04 ENCOUNTER — Encounter: Payer: Self-pay | Admitting: Internal Medicine

## 2013-03-04 VITALS — BP 112/68 | HR 69 | Temp 98.7°F | Wt 202.5 lb

## 2013-03-04 DIAGNOSIS — Z23 Encounter for immunization: Secondary | ICD-10-CM

## 2013-03-04 DIAGNOSIS — J309 Allergic rhinitis, unspecified: Secondary | ICD-10-CM

## 2013-03-04 MED ORDER — METHYLPREDNISOLONE ACETATE 40 MG/ML IJ SUSP
80.0000 mg | Freq: Once | INTRAMUSCULAR | Status: AC
  Administered 2013-03-04: 80 mg via INTRAMUSCULAR

## 2013-03-04 NOTE — Patient Instructions (Signed)
Allergic Rhinitis Allergic rhinitis is when the mucous membranes in the nose respond to allergens. Allergens are particles in the air that cause your body to have an allergic reaction. This causes you to release allergic antibodies. Through a chain of events, these eventually cause you to release histamine into the blood stream (hence the use of antihistamines). Although meant to be protective to the body, it is this release that causes your discomfort, such as frequent sneezing, congestion and an itchy runny nose.  CAUSES  The pollen allergens may come from grasses, trees, and weeds. This is seasonal allergic rhinitis, or "hay fever." Other allergens cause year-round allergic rhinitis (perennial allergic rhinitis) such as house dust mite allergen, pet dander and mold spores.  SYMPTOMS   Nasal stuffiness (congestion).  Runny, itchy nose with sneezing and tearing of the eyes.  There is often an itching of the mouth, eyes and ears. It cannot be cured, but it can be controlled with medications. DIAGNOSIS  If you are unable to determine the offending allergen, skin or blood testing may find it. TREATMENT   Avoid the allergen.  Medications and allergy shots (immunotherapy) can help.  Hay fever may often be treated with antihistamines in pill or nasal spray forms. Antihistamines block the effects of histamine. There are over-the-counter medicines that may help with nasal congestion and swelling around the eyes. Check with your caregiver before taking or giving this medicine. If the treatment above does not work, there are many new medications your caregiver can prescribe. Stronger medications may be used if initial measures are ineffective. Desensitizing injections can be used if medications and avoidance fails. Desensitization is when a patient is given ongoing shots until the body becomes less sensitive to the allergen. Make sure you follow up with your caregiver if problems continue. SEEK MEDICAL  CARE IF:   You develop fever (more than 100.5 F (38.1 C).  You develop a cough that does not stop easily (persistent).  You have shortness of breath.  You start wheezing.  Symptoms interfere with normal daily activities. Document Released: 01/01/2001 Document Revised: 07/01/2011 Document Reviewed: 07/13/2008 Exit[employer] Patient Information 2014 [employer].  

## 2013-03-04 NOTE — Addendum Note (Signed)
Addended by: Shon Millet on: 03/04/2013 10:46 AM   Modules accepted: Orders

## 2013-03-04 NOTE — Progress Notes (Signed)
HPI  Pt presents to the clinic today with c/o nasal congestion and nausea. This started about 8 days ago. She does have associated headache and body aches. She has also had some sneezing. She has taken tylenol cold and flu and advil. This morning she woke up with nausea and dry heaves secondary to the drainage. She denies fever, chills or body aches. She has a history of intermittent seasonal allergies, but denies asthma or breathing problems. She has had sick contacts.  Review of Systems      Past Medical History  Diagnosis Date  . Palpitations   . Fatigue   . Dyspepsia   . Back pain   . Depression   . Anxiety   . Breast pain, right   . Edema   . Abnormal mammogram, unspecified   . Nonspecific abnormal results of other specified function study   . Routine general medical examination at a health care facility   . Abnormal weight gain     Family History  Problem Relation Age of Onset  . Ovarian cancer Mother   . Stroke Mother 34  . Cancer Mother     ovarian  . Hypokalemia Sister     deceased at 23; was getting regular IV K infusions; long Hx of pseudotumor cerebri  . Heart attack      GF - 35  . Coronary artery disease      family hx  . Hypertension      family hx  . Diabetes      family hx  . Heart disease Father     CABG age 30    History   Social History  . Marital Status: Divorced    Spouse Name: N/A    Number of Children: 2  . Years of Education: N/A   Occupational History  . financial analyst [employer]   Social History Main Topics  . Smoking status: Never Smoker   . Smokeless tobacco: Not on file     Comment: nonsmoker  . Alcohol Use: No  . Drug Use: No  . Sexual Activity: Not on file   Other Topics Concern  . Not on file   Social History Narrative   Divorced since 9/08; 2 children   3 coffees/day     No Known Allergies   Constitutional: Positive headache, fatigue. Denies abrupt weight changes.  HEENT:  Positive sore throat. Denies eye  redness, eye pain, pressure behind the eyes, facial pain, nasal congestion, ear pain, ringing in the ears, wax buildup, runny nose or bloody nose. Respiratory: Positive cough. Denies difficulty breathing or shortness of breath.  Cardiovascular: Denies chest pain, chest tightness, palpitations or swelling in the hands or feet.   No other specific complaints in a complete review of systems (except as listed in HPI above).  Objective:   BP 112/68  Pulse 69  Temp(Src) 98.7 F (37.1 C) (Oral)  Wt 202 lb 8 oz (91.853 kg)  SpO2 96% Wt Readings from Last 3 Encounters:  03/04/13 202 lb 8 oz (91.853 kg)  12/24/11 200 lb (90.719 kg)  09/17/11 199 lb (90.266 kg)     General: Appears her stated age, overweight but well developed, well nourished in NAD. HEENT: Head: normal shape and size; Eyes: sclera white, no icterus, conjunctiva pink, PERRLA and EOMs intact; Ears: Tm's gray and intact, normal light reflex; Nose: mucosa boggy and moist, septum midline; Throat/Mouth: + PND. Teeth present, mucosa erythematous and moist, no exudate noted, no lesions or ulcerations noted.  Neck: Neck supple, trachea midline. No massses, lumps or thyromegaly present.  Cardiovascular: Normal rate and rhythm. S1,S2 noted.  No murmur, rubs or gallops noted. No JVD or BLE edema. No carotid bruits noted. Pulmonary/Chest: Normal effort and positive vesicular breath sounds. No respiratory distress. No wheezes, rales or ronchi noted.      Assessment & Plan:   Allergic Rhinitis:  Get some rest and drink plenty of water Do salt water gargles for the sore throat 80 mg Depo IM today for symptoms Take your claritin OTC daily  RTC as needed or if symptoms persist.

## 2013-03-04 NOTE — Progress Notes (Signed)
Pre-visit discussion using our clinic review tool. No additional management support is needed unless otherwise documented below in the visit note.  

## 2013-05-19 ENCOUNTER — Telehealth: Payer: Self-pay | Admitting: Family Medicine

## 2013-05-19 NOTE — Telephone Encounter (Signed)
Patient scheduled appointment for a physical on 08/05/13.  Patient works at [employer] and is asking for the lab order to be mailed to her and she'll have her lab work done before the appointment.

## 2013-05-20 NOTE — Telephone Encounter (Signed)
Lm on pts vm informing her lab request will be mailed to her on today. Labs requested include CMET, CBC, TSH (v70.0) and lipid panel (v81.0)

## 2013-05-20 NOTE — Telephone Encounter (Signed)
Orders written and in my box. 

## 2013-08-03 ENCOUNTER — Telehealth: Payer: Self-pay | Admitting: Family Medicine

## 2013-08-03 NOTE — Telephone Encounter (Signed)
Patient rescheduled her physical appointment from 08/05/13 to 10/27/13.  Patient has received an order for lab work, but she lost it. Can patient get a new prescription for her lab work?

## 2013-08-05 ENCOUNTER — Encounter: Payer: 59 | Admitting: Family Medicine

## 2013-08-05 NOTE — Telephone Encounter (Signed)
Lm on pts vm informing her orders are available for pickup at the front desk 

## 2013-08-05 NOTE — Telephone Encounter (Signed)
Yes rx written and in my box.

## 2013-09-15 ENCOUNTER — Telehealth: Payer: Self-pay

## 2013-09-15 NOTE — Telephone Encounter (Signed)
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

## 2013-09-15 NOTE — Telephone Encounter (Signed)
Pt notified as instructed and pt voiced understanding. 

## 2013-09-15 NOTE — Telephone Encounter (Signed)
Ok to give as requested.

## 2013-09-15 NOTE — Telephone Encounter (Signed)
Left v/m requesting cb. 

## 2013-09-17 ENCOUNTER — Ambulatory Visit (INDEPENDENT_AMBULATORY_CARE_PROVIDER_SITE_OTHER): Payer: 59

## 2013-09-17 DIAGNOSIS — Z111 Encounter for screening for respiratory tuberculosis: Secondary | ICD-10-CM

## 2013-09-17 DIAGNOSIS — Z23 Encounter for immunization: Secondary | ICD-10-CM

## 2013-09-20 LAB — TB SKIN TEST: TB SKIN TEST: NEGATIVE

## 2013-10-13 ENCOUNTER — Encounter: Payer: Self-pay | Admitting: Family Medicine

## 2013-10-15 ENCOUNTER — Ambulatory Visit (INDEPENDENT_AMBULATORY_CARE_PROVIDER_SITE_OTHER): Payer: 59

## 2013-10-15 DIAGNOSIS — Z111 Encounter for screening for respiratory tuberculosis: Secondary | ICD-10-CM

## 2013-10-15 DIAGNOSIS — Z23 Encounter for immunization: Secondary | ICD-10-CM

## 2013-10-18 LAB — TB SKIN TEST: TB SKIN TEST: NEGATIVE

## 2013-10-27 ENCOUNTER — Ambulatory Visit (INDEPENDENT_AMBULATORY_CARE_PROVIDER_SITE_OTHER): Payer: 59 | Admitting: Family Medicine

## 2013-10-27 ENCOUNTER — Encounter: Payer: Self-pay | Admitting: Family Medicine

## 2013-10-27 VITALS — BP 118/78 | HR 66 | Temp 98.2°F | Ht 65.5 in | Wt 205.2 lb

## 2013-10-27 DIAGNOSIS — R635 Abnormal weight gain: Secondary | ICD-10-CM

## 2013-10-27 DIAGNOSIS — Z0000 Encounter for general adult medical examination without abnormal findings: Secondary | ICD-10-CM

## 2013-10-27 DIAGNOSIS — G478 Other sleep disorders: Secondary | ICD-10-CM

## 2013-10-27 MED ORDER — PHENTERMINE HCL 15 MG PO CAPS
15.0000 mg | ORAL_CAPSULE | ORAL | Status: DC
Start: 2013-10-27 — End: 2014-02-10

## 2013-10-27 NOTE — Assessment & Plan Note (Signed)
Deteriorated. Discussed weight loss plan. She has already met with a nutritionist. Pt would also like to discuss medication options- discussed phentermine risk benefits, side effects including HTN, pulmonary HTN, stroke.    She would like to start phentermine and lifestyle changes.  Follow up in 1 month.  If BMI < 27 will decrease to half dose x 1 month then stop

## 2013-10-27 NOTE — Assessment & Plan Note (Signed)
Reviewed preventive care protocols, scheduled due services, and updated immunizations Discussed nutrition, exercise, diet, and healthy lifestyle.  

## 2013-10-27 NOTE — Patient Instructions (Signed)
Good to see you. We are starting phentermine 15 mg daily- 1-2 hours after breakfast like we discussed. Please let me know immediately if you have any symptoms- like palpitations, chest pain, etc.  We will call you with your referral for a sleep study.  You will meet with a lung doctor first to set up your sleep study.

## 2013-10-27 NOTE — Progress Notes (Signed)
38 yo pleasant female here for CPX.   Lipid panel, TSH, CBC, and CMET excellent ([facility] scanned labs).  S/p hysterectomy, ovaries intact.   Had mammogram in 05/2012- neg.  Cleared for screening mammography until she is 38 yo.  BRCA neg (two aunts had ovarian CA).  Sleep issues- she never feels rested.  She sometimes feels she is gasping for air at night.  Obesity- feels she has been working hard on her diet.  She has completely eliminated processed foods.  tryiing to walk more but cannot seem to lose weight.  We had discussed phentermine in past but had agreed she should not try it at that time due to palpitations.  She has not had any palpitations in over a year and a half.     Patient Active Problem List   Diagnosis Date Noted  . Fatigue 12/24/2011  . Jittery 12/24/2011  . Routine general medical examination at a health care facility 09/17/2011  . Right low back pain 09/17/2011  . Abnormal mammogram, unspecified 05/25/2010  . BREAST PAIN, RIGHT 05/21/2010  . PALPITATIONS 08/09/2009  . WEIGHT GAIN 01/19/2009  . NONSPECIFIC ABNORM RESULTS OTH SPEC FUNCT STUDY 01/19/2009  . DYSPEPSIA 05/02/2008  . BACK PAIN 05/02/2008  . DEPRESSION/ANXIETY 06/03/2007  . DEPENDENT EDEMA 02/19/2007   Past Medical History  Diagnosis Date  . Palpitations   . Fatigue   . Dyspepsia   . Back pain   . Depression   . Anxiety   . Breast pain, right   . Edema   . Abnormal mammogram, unspecified   . Nonspecific abnormal results of other specified function study   . Routine general medical examination at a health care facility   . Abnormal weight gain    Past Surgical History  Procedure Laterality Date  . Partial hysterectomy  2008    endometriosis  . Tonsillectomy    . Adenoidectomy    . Wisdom tooth extraction    . Dilation and curettage of uterus    . Brca test      neg   History  Substance Use Topics  . Smoking status: Never Smoker   . Smokeless tobacco: Not on file     Comment:  nonsmoker  . Alcohol Use: No   Family History  Problem Relation Age of Onset  . Ovarian cancer Mother   . Stroke Mother 55  . Cancer Mother     ovarian  . Hypokalemia Sister     deceased at 66; was getting regular IV K infusions; long Hx of pseudotumor cerebri  . Heart attack      GF - 35  . Coronary artery disease      family hx  . Hypertension      family hx  . Diabetes      family hx  . Heart disease Father     CABG age 9   No Known Allergies Current Outpatient Prescriptions on File Prior to Visit  Medication Sig Dispense Refill  . ibuprofen (ADVIL,MOTRIN) 200 MG tablet Take 200 mg by mouth every 6 (six) hours as needed. UAD        No current facility-administered medications on file prior to visit.  The PMH, PSH, Social History, Family History, Medications, and allergies have been reviewed in [facility], and have been updated if relevant.   Review of Systems  See HPI  Patient reports no  vision/ hearing changes,anorexia, weight change, fever ,adenopathy, persistant / recurrent hoarseness, swallowing issues, chest pain, edema,persistant /  recurrent cough, hemoptysis, dyspnea(rest, exertional, paroxysmal nocturnal), gastrointestinal  bleeding (melena, rectal bleeding), abdominal pain, excessive heart burn, GU symptoms(dysuria, hematuria, pyuria, voiding/incontinence  Issues) syncope, focal weakness, severe memory loss, concerning skin lesions, depression, anxiety, abnormal bruising/bleeding, major joint swelling, breast masses or abnormal vaginal bleeding.     Physical Exam  BP 118/78  Pulse 66  Temp(Src) 98.2 F (36.8 C) (Oral)  Ht 5' 5.5" (1.664 m)  Wt 205 lb 4 oz (93.101 kg)  BMI 33.62 kg/m2  SpO2 97% Wt Readings from Last 3 Encounters:  10/27/13 205 lb 4 oz (93.101 kg)  03/04/13 202 lb 8 oz (91.853 kg)  12/24/11 200 lb (90.719 kg)     General: alert, well-developed, well-nourished, well-hydrated, and overweight-appearing. NAD  weight stable  Head: normocephalic  and atraumatic.  Eyes: vision grossly intact, pupils equal, and pupils round.  Ears: R ear normal and L ear normal.  Nose: no external deformity.  Mouth: good dentition.  Neck: No deformities, masses, or tenderness noted.  Breasts: No mass, nodules, thickening, tenderness, bulging, retraction, inflamation, nipple discharge or skin changes noted.  Lungs: Normal respiratory effort, chest expands symmetrically. Lungs are clear to auscultation, no crackles or wheezes.  Heart: normal rate, regular rhythm, and no murmur.  Abdomen: Bowel sounds positive,abdomen soft and non-tender without masses, organomegaly or hernias noted.no guarding, no rigidity, and no rebound tenderness.  Vagina: normal without lesions or masses  Adnexa: normal bimanual exam without masses or fullness  Pap smear: not performed  Msk: normal ROM, no TTP over thoracic spine or parapsinous muscles Extremities: no edema either lower legs  Neurologic: alert & oriented X3 and gait normal.  Skin: Intact without suspicious lesions or rashes  Psych: Cognition and judgment appear intact. Alert and cooperative with normal attention span and concentration. No apparent delusions, illusions, hallucinations   Assessment and Plan:

## 2013-10-27 NOTE — Progress Notes (Signed)
Pre visit review using our clinic review tool, if applicable. No additional management support is needed unless otherwise documented below in the visit note. 

## 2013-10-27 NOTE — Assessment & Plan Note (Signed)
Refer to sleep study to rule out OSA.

## 2013-12-16 ENCOUNTER — Institutional Professional Consult (permissible substitution): Payer: 59 | Admitting: Pulmonary Disease

## 2014-02-10 ENCOUNTER — Ambulatory Visit (INDEPENDENT_AMBULATORY_CARE_PROVIDER_SITE_OTHER): Payer: 59 | Admitting: Family Medicine

## 2014-02-10 ENCOUNTER — Encounter: Payer: Self-pay | Admitting: Family Medicine

## 2014-02-10 VITALS — BP 126/74 | HR 72 | Temp 98.0°F | Ht 66.0 in | Wt 201.5 lb

## 2014-02-10 DIAGNOSIS — E669 Obesity, unspecified: Secondary | ICD-10-CM

## 2014-02-10 DIAGNOSIS — Z23 Encounter for immunization: Secondary | ICD-10-CM

## 2014-02-10 MED ORDER — PHENTERMINE HCL 15 MG PO CAPS: 15.0000 mg | ORAL_CAPSULE | ORAL | Status: DC

## 2014-02-10 NOTE — Patient Instructions (Signed)
Great to see you. Please schedule a 1 month follow up.

## 2014-02-10 NOTE — Progress Notes (Signed)
Pre visit review using our clinic review tool, if applicable. No additional management support is needed unless otherwise documented below in the visit note. 

## 2014-02-10 NOTE — Assessment & Plan Note (Signed)
>  25 minutes spent in face to face time with patient, >50% spent in counselling or coordination of care Set new goals- 15 lbs vs goal given by [facility]. Discussed reducing caloric intake to 1200- 1400 daily. Restart low dose phentermine- 15 mg daily. Follow up in 1 month.

## 2014-02-10 NOTE — Progress Notes (Signed)
Subjective:    Patient ID: Shelby Rios, female    DOB: 1975/08/04, 38 y.o.   MRN: [id number]  HPI  Obesity- works for [employer].  Brings in form to defer or change BMI goals for insurances.   Feels she has been working hard on her diet.  She has completely eliminated processed foods.  Tryiing to walk more but cannot seem to lose weight.  Also has a stationary bike which she has been trying to use.  Has been cutting back on carbohydrates.   We tried very low phentermine in past but had agreed she should not try higher dose it at that time due to palpitations.   She tolerated low dose ok.  Current Outpatient Prescriptions on File Prior to Visit  Medication Sig Dispense Refill  . ibuprofen (ADVIL,MOTRIN) 200 MG tablet Take 200 mg by mouth every 6 (six) hours as needed. UAD        No current facility-administered medications on file prior to visit.    No Known Allergies  Past Medical History  Diagnosis Date  . Palpitations   . Fatigue   . Dyspepsia   . Back pain   . Depression   . Anxiety   . Breast pain, right   . Edema   . Abnormal mammogram, unspecified   . Nonspecific abnormal results of other specified function study   . Routine general medical examination at a health care facility   . Abnormal weight gain     Past Surgical History  Procedure Laterality Date  . Partial hysterectomy  2008    endometriosis  . Tonsillectomy    . Adenoidectomy    . Wisdom tooth extraction    . Dilation and curettage of uterus    . Brca test      neg    Family History  Problem Relation Age of Onset  . Ovarian cancer Mother   . Stroke Mother 37  . Cancer Mother     ovarian  . Hypokalemia Sister     deceased at 55; was getting regular IV K infusions; long Hx of pseudotumor cerebri  . Heart attack      GF - 35  . Coronary artery disease      family hx  . Hypertension      family hx  . Diabetes      family hx  . Heart disease Father     CABG age 44    History   Social  History  . Marital Status: Divorced    Spouse Name: N/A    Number of Children: 2  . 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 Smoking status: Never Smoker   . Smokeless tobacco: Not on file     Comment: nonsmoker  . Alcohol Use: No  . Drug Use: No  . Sexual Activity: Not on file   Other Topics Concern  . Not on file   Social History Narrative   Divorced since 9/08; 2 children   3 coffees/day    The PMH, PSH, Social History, Family History, Medications, and allergies have been reviewed in [facility], and have been updated if relevant.   Review of Systems  Respiratory: Negative.   Cardiovascular: Negative.   Gastrointestinal: Negative.   All other systems reviewed and are negative.      Objective:   Physical Exam  Nursing note and vitals reviewed. Constitutional: She appears well-developed  and well-nourished. No distress.  Skin: Skin is warm and dry.  Psychiatric: She has a normal mood and affect. Her behavior is normal. Judgment and thought content normal.    BP 126/74  Pulse 72  Temp(Src) 98 F (36.7 C) (Oral)  Ht $R'5\' 6"'AU$  (1.676 m)  Wt 201 lb 8 oz (91.4 kg)  BMI 32.54 kg/m2  SpO2 97%  Wt Readings from Last 3 Encounters:  02/10/14 201 lb 8 oz (91.4 kg)  10/27/13 205 lb 4 oz (93.101 kg)  03/04/13 202 lb 8 oz (91.853 kg)        Assessment & Plan:

## 2014-03-14 ENCOUNTER — Ambulatory Visit (INDEPENDENT_AMBULATORY_CARE_PROVIDER_SITE_OTHER): Payer: 59 | Admitting: Family Medicine

## 2014-03-14 ENCOUNTER — Encounter: Payer: Self-pay | Admitting: Family Medicine

## 2014-03-14 VITALS — BP 128/68 | HR 69 | Temp 97.9°F | Wt 202.5 lb

## 2014-03-14 DIAGNOSIS — E669 Obesity, unspecified: Secondary | ICD-10-CM

## 2014-03-14 DIAGNOSIS — Z23 Encounter for immunization: Secondary | ICD-10-CM

## 2014-03-14 MED ORDER — PHENTERMINE HCL 15 MG PO CAPS: 15.0000 mg | ORAL_CAPSULE | ORAL | Status: DC

## 2014-03-14 NOTE — Progress Notes (Signed)
Subjective:    Patient ID: Shelby Rios, female    DOB: 10-Oct-1975, 38 y.o.   MRN: [id number]  HPI  Obesity- works for [employer] she has been working hard on her diet.  She has completely eliminated processed foods.  Tryiing to walk more and been cutting back on carbohydrates.  Started low dose phentermine last month.  Feels she is tolerating this ok- no palpitations, SOB, or CP. She has noticed a decrease in her appetite.  Wearing heavy shoes today but according to home scales, has lost 3 pounds. Wt Readings from Last 3 Encounters:  03/14/14 202 lb 8 oz (91.853 kg)  02/10/14 201 lb 8 oz (91.4 kg)  10/27/13 205 lb 4 oz (93.101 kg)     Current Outpatient Prescriptions on File Prior to Visit  Medication Sig Dispense Refill  . ibuprofen (ADVIL,MOTRIN) 200 MG tablet Take 200 mg by mouth every 6 (six) hours as needed. UAD      No current facility-administered medications on file prior to visit.    No Known Allergies  Past Medical History  Diagnosis Date  . Palpitations   . Fatigue   . Dyspepsia   . Back pain   . Depression   . Anxiety   . Breast pain, right   . Edema   . Abnormal mammogram, unspecified   . Nonspecific abnormal results of other specified function study   . Routine general medical examination at a health care facility   . Abnormal weight gain     Past Surgical History  Procedure Laterality Date  . Partial hysterectomy  2008    endometriosis  . Tonsillectomy    . Adenoidectomy    . Wisdom tooth extraction    . Dilation and curettage of uterus    . Brca test      neg    Family History  Problem Relation Age of Onset  . Ovarian cancer Mother   . Stroke Mother 13  . Cancer Mother     ovarian  . Hypokalemia Sister     deceased at 30; was getting regular IV K infusions; long Hx of pseudotumor cerebri  . Heart attack      GF - 35  . Coronary artery disease      family hx  . Hypertension      family hx  . Diabetes      family hx  .  Heart disease Father     CABG age 21    History   Social History  . Marital Status: Divorced    Spouse Name: N/A    Number of Children: 2  . 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 Smoking status: Never Smoker   . Smokeless tobacco: Not on file     Comment: nonsmoker  . Alcohol Use: No  . Drug Use: No  . Sexual Activity: Not on file   Other Topics Concern  . Not on file   Social History Narrative   Divorced since 9/08; 2 children   3 coffees/day    The PMH, PSH, Social History, Family History, Medications, and allergies have been reviewed in [facility], and have been updated if relevant.   Review of Systems  Respiratory: Negative.   Cardiovascular: Negative.   Gastrointestinal: Negative.   All other systems reviewed and are negative.  Has had a little insomnia lately but she is  not sure it is related to phentermine    Objective:   Physical Exam  Constitutional: She appears well-developed and well-nourished. No distress.  Cardiovascular: Normal rate and regular rhythm.   Pulmonary/Chest: Effort normal and breath sounds normal.  Skin: Skin is warm and dry.  Psychiatric: She has a normal mood and affect. Her behavior is normal. Thought content normal.  Nursing note and vitals reviewed.   BP 128/68 mmHg  Pulse 69  Temp(Src) 97.9 F (36.6 C) (Oral)  Wt 202 lb 8 oz (91.853 kg)  SpO2 98%  Wt Readings from Last 3 Encounters:  03/14/14 202 lb 8 oz (91.853 kg)  02/10/14 201 lb 8 oz (91.4 kg)  10/27/13 205 lb 4 oz (93.101 kg)        Assessment & Plan:

## 2014-03-14 NOTE — Addendum Note (Signed)
Addended by: Desmond DikeKNIGHT, Vaneza Pickart H on: 03/14/2014 08:54 AM   Modules accepted: Orders

## 2014-03-14 NOTE — Patient Instructions (Signed)
Great to see you. Happy Thanksgiving!  Please come see me in 2 months.

## 2014-03-14 NOTE — Progress Notes (Signed)
Pre visit review using our clinic review tool, if applicable. No additional management support is needed unless otherwise documented below in the visit note. 

## 2014-03-14 NOTE — Assessment & Plan Note (Signed)
Improving without side effects of phentermine. She would like to continue this. Rx printed and given to pt. Follow up in 2- 3 months.

## 2014-08-15 ENCOUNTER — Telehealth: Payer: Self-pay | Admitting: Family Medicine

## 2014-08-15 NOTE — Telephone Encounter (Signed)
Pt called and scheduled her cpx for 11/02/14.  She works for [employer] and wants the lab order mailed to her

## 2014-08-18 NOTE — Telephone Encounter (Signed)
Written and in my box.

## 2014-08-18 NOTE — Telephone Encounter (Signed)
Orders mailed as requested.

## 2014-10-28 LAB — BASIC METABOLIC PANEL
CREATININE: 0.7 mg/dL (ref 0.5–1.1)
Potassium: 4.8 mmol/L (ref 3.4–5.3)
Sodium: 139 mmol/L (ref 137–147)

## 2014-10-28 LAB — LIPID PANEL
Cholesterol: 171 mg/dL (ref 0–200)
HDL: 47 mg/dL (ref 35–70)
LDL CALC: 114 mg/dL
Triglycerides: 50 mg/dL (ref 40–160)

## 2014-10-28 LAB — CBC AND DIFFERENTIAL
HEMATOCRIT: 37 % (ref 36–46)
HEMOGLOBIN: 12.4 g/dL (ref 12.0–16.0)
WBC: 7.2 10^3/mL

## 2014-10-28 LAB — HEPATIC FUNCTION PANEL
ALT: 21 U/L (ref 7–35)
AST: 17 U/L (ref 13–35)
Alkaline Phosphatase: 71 U/L (ref 25–125)

## 2014-10-28 LAB — TSH: TSH: 1.82 u[IU]/mL (ref 0.41–5.90)

## 2014-10-31 ENCOUNTER — Encounter: Payer: Self-pay | Admitting: Family Medicine

## 2014-11-02 ENCOUNTER — Ambulatory Visit (INDEPENDENT_AMBULATORY_CARE_PROVIDER_SITE_OTHER): Payer: 59 | Admitting: Family Medicine

## 2014-11-02 ENCOUNTER — Encounter: Payer: Self-pay | Admitting: Family Medicine

## 2014-11-02 VITALS — BP 118/70 | HR 80 | Temp 98.6°F | Ht 66.0 in | Wt 209.0 lb

## 2014-11-02 DIAGNOSIS — E538 Deficiency of other specified B group vitamins: Secondary | ICD-10-CM

## 2014-11-02 DIAGNOSIS — F341 Dysthymic disorder: Secondary | ICD-10-CM | POA: Diagnosis not present

## 2014-11-02 DIAGNOSIS — E559 Vitamin D deficiency, unspecified: Secondary | ICD-10-CM | POA: Diagnosis not present

## 2014-11-02 DIAGNOSIS — E669 Obesity, unspecified: Secondary | ICD-10-CM | POA: Diagnosis not present

## 2014-11-02 DIAGNOSIS — Z0000 Encounter for general adult medical examination without abnormal findings: Secondary | ICD-10-CM

## 2014-11-02 HISTORY — DX: Deficiency of other specified B group vitamins: E53.8

## 2014-11-02 MED ORDER — VITAMIN D (ERGOCALCIFEROL) 1.25 MG (50000 UNIT) PO CAPS: 50000.0000 [IU] | ORAL_CAPSULE | ORAL | Status: DC

## 2014-11-02 MED ORDER — CYANOCOBALAMIN 1000 MCG/ML IJ SOLN
1000.0000 ug | Freq: Once | INTRAMUSCULAR | Status: AC
  Administered 2014-11-02: 1000 ug via INTRAMUSCULAR

## 2014-11-02 NOTE — Progress Notes (Signed)
39 yo pleasant female here for CPX and follow up of chronic medical conditions.   Was seeing Dr. Berliner - OBGYN but he retired. S/p hysterectomy, ovaries intact.   Had mammogram in 05/2012- neg.  Cleared for screening mammography until she is 40 yo.  BRCA neg (two aunts had ovarian CA).   Obesity- feels she has been working hard on her diet.  She has completely eliminated processed foods.  tryiing to walk more but cannot seem to lose weight.  Phentermine did not work well.  Has been more fatigued lately.  Vit B12 and Vit D both low this month.  She is not taking OTC supplements.     Wt Readings from Last 3 Encounters:  11/02/14 209 lb (94.802 kg)  03/14/14 202 lb 8 oz (91.853 kg)  02/10/14 201 lb 8 oz (91.4 kg)   Lab Results  Component Value Date   WBC 7.2 10/28/2014   HGB 12.4 10/28/2014   HCT 37 10/28/2014   MCV 87 12/24/2011   PLT 285 03/10/2007   Lab Results  Component Value Date   TSH 1.82 10/28/2014   Lab Results  Component Value Date   CHOL 171 10/28/2014   HDL 47 10/28/2014   LDLCALC 114 10/28/2014   TRIG 50 10/28/2014   Lab Results  Component Value Date   CREATININE 0.7 10/28/2014   Lab Results  Component Value Date   NA 139 10/28/2014   K 4.8 10/28/2014   CL 100 12/24/2011   CO2 22 12/24/2011   Lab Results  Component Value Date   ALT 21 10/28/2014   AST 17 10/28/2014   ALKPHOS 71 10/28/2014   BILITOT 0.2 12/24/2011   3 Patient Active Problem List   Diagnosis Date Noted  . Other sleep disturbances 10/27/2013  . Routine general medical examination at a health care facility 09/17/2011  . Abnormal mammogram, unspecified 05/25/2010  . BREAST PAIN, RIGHT 05/21/2010  . PALPITATIONS 08/09/2009  . Obesity (BMI 30-39.9) 01/19/2009  . NONSPECIFIC ABNORM RESULTS OTH SPEC FUNCT STUDY 01/19/2009  . DYSPEPSIA 05/02/2008  . BACK PAIN 05/02/2008  . DEPRESSION/ANXIETY 06/03/2007  . DEPENDENT EDEMA 02/19/2007   Past Medical History  Diagnosis Date  .  Palpitations   . Fatigue   . Dyspepsia   . Back pain   . Depression   . Anxiety   . Breast pain, right   . Edema   . Abnormal mammogram, unspecified   . Nonspecific abnormal results of other specified function study   . Routine general medical examination at a health care facility   . Abnormal weight gain    Past Surgical History  Procedure Laterality Date  . Partial hysterectomy  2008    endometriosis  . Tonsillectomy    . Adenoidectomy    . Wisdom tooth extraction    . Dilation and curettage of uterus    . Brca test      neg   History  Substance Use Topics  . Smoking status: Never Smoker   . Smokeless tobacco: Not on file     Comment: nonsmoker  . Alcohol Use: No   Family History  Problem Relation Age of Onset  . Ovarian cancer Mother   . Stroke Mother 32  . Cancer Mother     ovarian  . Hypokalemia Sister     deceased at 28; was getting regular IV K infusions; long Hx of pseudotumor cerebri  . Heart attack      GF - 35  .   Coronary artery disease      family hx  . Hypertension      family hx  . Diabetes      family hx  . Heart disease Father     CABG age 56   No Known Allergies Current Outpatient Prescriptions on File Prior to Visit  Medication Sig Dispense Refill  . ibuprofen (ADVIL,MOTRIN) 200 MG tablet Take 200 mg by mouth every 6 (six) hours as needed. UAD      No current facility-administered medications on file prior to visit.  The PMH, PSH, Social History, Family History, Medications, and allergies have been reviewed in [facility], and have been updated if relevant.   Review of Systems  Review of Systems  Constitutional: Positive for fatigue.  HENT: Negative.   Respiratory: Negative.   Cardiovascular: Negative.   Gastrointestinal: Negative.   Endocrine: Negative.   Genitourinary: Negative.   Musculoskeletal: Negative.   Skin: Negative.   Allergic/Immunologic: Negative.   Neurological: Negative.   Hematological: Negative.    Psychiatric/Behavioral: Negative.   All other systems reviewed and are negative.      Physical Exam  BP 118/70 mmHg  Pulse 80  Temp(Src) 98.6 F (37 C) (Tympanic)  Ht 5' 6" (1.676 m)  Wt 209 lb (94.802 kg)  BMI 33.75 kg/m2  SpO2 96% Wt Readings from Last 3 Encounters:  11/02/14 209 lb (94.802 kg)  03/14/14 202 lb 8 oz (91.853 kg)  02/10/14 201 lb 8 oz (91.4 kg)    General:  Well-developed,well-nourished,in no acute distress; alert,appropriate and cooperative throughout examination Head:  normocephalic and atraumatic.   Eyes:  vision grossly intact, pupils equal, pupils round, and pupils reactive to light.   Ears:  R ear normal and L ear normal.   Nose:  no external deformity.   Mouth:  good dentition.   Neck:  No deformities, masses, or tenderness noted. Breasts:  No mass, nodules, thickening, tenderness, bulging, retraction, inflamation, nipple discharge or skin changes noted.   Lungs:  Normal respiratory effort, chest expands symmetrically. Lungs are clear to auscultation, no crackles or wheezes. Heart:  Normal rate and regular rhythm. S1 and S2 normal without gallop, murmur, click, rub or other extra sounds. Abdomen:  Bowel sounds positive,abdomen soft and non-tender without masses, organomegaly or hernias noted. Rectal:  no external abnormalities.   Genitalia:  Pelvic Exam:        External: normal female genitalia without lesions or masses        Vagina: normal without lesions or masses        Cervix: absent        Adnexa: normal bimanual exam without masses or fullness        Uterus:absent  Msk:  No deformity or scoliosis noted of thoracic or lumbar spine.   Extremities:  No clubbing, cyanosis, edema, or deformity noted with normal full range of motion of all joints.   Neurologic:  alert & oriented X3 and gait normal.   Skin:  Intact without suspicious lesions or rashes Cervical Nodes:  No lymphadenopathy noted Axillary Nodes:  No palpable lymphadenopathy Psych:   Cognition and judgment appear intact. Alert and cooperative with normal attention span and concentration. No apparent delusions, illusions, hallucinations

## 2014-11-02 NOTE — Assessment & Plan Note (Signed)
Reviewed preventive care protocols, scheduled due services, and updated immunizations Discussed nutrition, exercise, diet, and healthy lifestyle.  

## 2014-11-02 NOTE — Addendum Note (Signed)
Addended by: Desmond DikeKNIGHT, Sherena Machorro H on: 11/02/2014 08:11 AM   Modules accepted: Orders, Medications

## 2014-11-02 NOTE — Assessment & Plan Note (Signed)
New- Vit D3 50,000 units x 6 weeks - 1 tab po weekly x 7 weeks, then continue VitD 1600 IU daily.  Recheck in 8-12 weeks.

## 2014-11-02 NOTE — Assessment & Plan Note (Signed)
New- monthly IM b12 injections here- see AVS.

## 2014-11-02 NOTE — Assessment & Plan Note (Signed)
Persistent issue for Shelby Rios. Advised increased physical activity, track daily caloric intake. She has already been to a nutritionist. Declines another referral at this time.

## 2014-11-02 NOTE — Patient Instructions (Signed)
Good to see you. We gave you a B12 injection today.  Please return in one month for another injection.  We are starting weekly Vit D 50,000 IU for 7 weeks, then take OTC Vit D 1600-2000 IU per day.  Return labs in about in 3 months.

## 2014-11-02 NOTE — Assessment & Plan Note (Signed)
Symptoms controlled without rx. No changes made today.

## 2014-11-02 NOTE — Progress Notes (Signed)
Pre visit review using our clinic review tool, if applicable. No additional management support is needed unless otherwise documented below in the visit note. 

## 2014-11-29 ENCOUNTER — Ambulatory Visit (INDEPENDENT_AMBULATORY_CARE_PROVIDER_SITE_OTHER): Payer: 59 | Admitting: Family Medicine

## 2014-11-29 ENCOUNTER — Encounter: Payer: Self-pay | Admitting: Family Medicine

## 2014-11-29 VITALS — BP 122/72 | HR 70 | Temp 98.4°F | Wt 209.5 lb

## 2014-11-29 DIAGNOSIS — M659 Synovitis and tenosynovitis, unspecified: Secondary | ICD-10-CM

## 2014-11-29 DIAGNOSIS — M778 Other enthesopathies, not elsewhere classified: Secondary | ICD-10-CM | POA: Insufficient documentation

## 2014-11-29 MED ORDER — PREDNISONE 20 MG PO TABS: ORAL_TABLET | ORAL | Status: DC

## 2014-11-29 NOTE — Progress Notes (Signed)
SUBJECTIVE: Shelby Rios is a 39 y.o. female who sustained a right elbow injury 1 month(s) ago. Mechanism of injury: was canning tomatoes all day- repetitive movements. Immediate symptoms: immediate pain, no deformity was noted by the patient. Symptoms have been insidious and intermittent since that time. Prior history of related problems: no prior problems with this area in the past.   Has tried exercises and wearing brace along with Ibuprofen but pain still returns.  Holding hand outstretched causes the most pain.  Current Outpatient Prescriptions on File Prior to Visit  Medication Sig Dispense Refill  . cyanocobalamin (,VITAMIN B-12,) 1000 MCG/ML injection Inject 1,000 mcg into the muscle every 30 (thirty) days.    Marland Kitchen ibuprofen (ADVIL,MOTRIN) 200 MG tablet Take 200 mg by mouth every 6 (six) hours as needed. UAD     . Vitamin D, Ergocalciferol, (DRISDOL) 50000 UNITS CAPS capsule Take 1 capsule (50,000 Units total) by mouth every 7 (seven) days. 7 capsule 0   No current facility-administered medications on file prior to visit.    No Known Allergies  Past Medical History  Diagnosis Date  . Palpitations   . Fatigue   . Dyspepsia   . Back pain   . Depression   . Anxiety   . Breast pain, right   . Edema   . Abnormal mammogram, unspecified   . Nonspecific abnormal results of other specified function study   . Routine general medical examination at a health care facility   . Abnormal weight gain     Past Surgical History  Procedure Laterality Date  . Partial hysterectomy  2008    endometriosis  . Tonsillectomy    . Adenoidectomy    . Wisdom tooth extraction    . Dilation and curettage of uterus    . Brca test      neg    Family History  Problem Relation Age of Onset  . Ovarian cancer Mother   . Stroke Mother 34  . Cancer Mother     ovarian  . Hypokalemia Sister     deceased at 16; was getting regular IV K infusions; long Hx of pseudotumor cerebri  . Heart attack      GF - 35  . Coronary artery disease      family hx  . Hypertension      family hx  . Diabetes      family hx  . Heart disease Father     CABG age 56    History   Social History  . Marital Status: Married    Spouse Name: N/A  . Number of Children: 2  . 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 Smoking status: Never Smoker   . Smokeless tobacco: Not on file     Comment: nonsmoker  . Alcohol Use: No  . Drug Use: No  . Sexual Activity: Not on file   Other Topics Concern  . Not on file   Social History Narrative   Divorced since 9/08; 2 children   3 coffees/day    The PMH, PSH, Social History, Family History, Medications, and allergies have been reviewed in [facility], and have been updated if relevant.  OBJECTIVE: BP 122/72 mmHg  Pulse 70  Temp(Src) 98.4 F (36.9 C) (Oral)  Wt 209 lb 8 oz (95.029 kg)  SpO2 98%  Vital signs as noted above. Appearance: alert, well appearing, and in no distress. Elbow  exam: lateral epicondylar tenderness, radial pulse normal, ipsilateral shoulder, wrist and hand exam is normal, contralateral elbow exam is normal. X-ray: not indicated.  ASSESSMENT: elbow tendon injury  PLAN: rest the injured area as much as practical, apply ice packs Prednisone taper to help with inflammation, advised PT.  She will think about PT and let me know. See orders for this visit as documented in the electronic medical record.

## 2014-11-29 NOTE — Progress Notes (Signed)
Pre visit review using our clinic review tool, if applicable. No additional management support is needed unless otherwise documented below in the visit note. 

## 2014-11-29 NOTE — Patient Instructions (Signed)
Good to see you. Please take prednisone as directed-in the morning and with food.

## 2014-12-06 ENCOUNTER — Ambulatory Visit (INDEPENDENT_AMBULATORY_CARE_PROVIDER_SITE_OTHER): Payer: 59

## 2014-12-06 DIAGNOSIS — E538 Deficiency of other specified B group vitamins: Secondary | ICD-10-CM

## 2014-12-06 MED ORDER — CYANOCOBALAMIN 1000 MCG/ML IJ SOLN
1000.0000 ug | Freq: Once | INTRAMUSCULAR | Status: AC
  Administered 2014-12-06: 1000 ug via INTRAMUSCULAR

## 2015-01-12 ENCOUNTER — Encounter: Payer: Self-pay | Admitting: Family Medicine

## 2015-01-12 ENCOUNTER — Ambulatory Visit: Payer: 59

## 2015-01-12 ENCOUNTER — Ambulatory Visit (INDEPENDENT_AMBULATORY_CARE_PROVIDER_SITE_OTHER): Payer: 59 | Admitting: Family Medicine

## 2015-01-12 VITALS — BP 112/60 | HR 67 | Temp 98.0°F | Wt 203.5 lb

## 2015-01-12 DIAGNOSIS — Z0289 Encounter for other administrative examinations: Secondary | ICD-10-CM | POA: Diagnosis not present

## 2015-01-12 DIAGNOSIS — Z23 Encounter for immunization: Secondary | ICD-10-CM | POA: Diagnosis not present

## 2015-01-12 DIAGNOSIS — E538 Deficiency of other specified B group vitamins: Secondary | ICD-10-CM

## 2015-01-12 MED ORDER — CYANOCOBALAMIN 1000 MCG/ML IJ SOLN
1000.0000 ug | Freq: Once | INTRAMUSCULAR | Status: AC
  Administered 2015-01-12: 1000 ug via INTRAMUSCULAR

## 2015-01-12 MED ORDER — CYANOCOBALAMIN 1000 MCG/ML IJ SOLN: 1000.0000 ug | Freq: Once | INTRAMUSCULAR | Status: DC

## 2015-01-12 NOTE — Progress Notes (Signed)
Pre visit review using our clinic review tool, if applicable. No additional management support is needed unless otherwise documented below in the visit note. 

## 2015-01-12 NOTE — Progress Notes (Signed)
Subjective:   Patient ID: Shelby Rios, female    DOB: 1975/11/21, 39 y.o.   MRN: [id number]  Shelby Rios is a pleasant 39 y.o. year old female who presents to clinic today with Follow-up  on 01/12/2015  HPI:  Vit B12 deficiency- due for IM b12. Has noticed a little more energy since starting the injections.  Brings in form to complete for her employer asking for appeal for BMI.  She has been exercising daily, cutting back on calories.  She and her family ride bikes together and go to the gym.  Wt Readings from Last 3 Encounters:  01/12/15 203 lb 8 oz (92.307 kg)  11/29/14 209 lb 8 oz (95.029 kg)  11/02/14 209 lb (94.802 kg)   Current Outpatient Prescriptions on File Prior to Visit  Medication Sig Dispense Refill  . cyanocobalamin (,VITAMIN B-12,) 1000 MCG/ML injection Inject 1,000 mcg into the muscle every 30 (thirty) days.    Marland Kitchen ibuprofen (ADVIL,MOTRIN) 200 MG tablet Take 200 mg by mouth every 6 (six) hours as needed. UAD      No current facility-administered medications on file prior to visit.    No Known Allergies  Past Medical History  Diagnosis Date  . Palpitations   . Fatigue   . Dyspepsia   . Back pain   . Depression   . Anxiety   . Breast pain, right   . Edema   . Abnormal mammogram, unspecified   . Nonspecific abnormal results of other specified function study   . Routine general medical examination at a health care facility   . Abnormal weight gain     Past Surgical History  Procedure Laterality Date  . Partial hysterectomy  2008    endometriosis  . Tonsillectomy    . Adenoidectomy    . Wisdom tooth extraction    . Dilation and curettage of uterus    . Brca test      neg    Family History  Problem Relation Age of Onset  . Ovarian cancer Mother   . Stroke Mother 71  . Cancer Mother     ovarian  . Hypokalemia Sister     deceased at 60; was getting regular IV K infusions; long Hx of pseudotumor cerebri  . Heart attack      GF - 35  .  Coronary artery disease      family hx  . Hypertension      family hx  . Diabetes      family hx  . Heart disease Father     CABG age 30    Social History   Social History  . Marital Status: Married    Spouse Name: N/A  . Number of Children: 2  . 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 Smoking status: Never Smoker   . Smokeless tobacco: Not on file     Comment: nonsmoker  . Alcohol Use: No  . Drug Use: No  . Sexual Activity: Not on file   Other Topics Concern  . Not on file   Social History Narrative   Divorced since 9/08; 2 children   3 coffees/day    The PMH, PSH, Social History, Family History, Medications, and allergies have been reviewed in [facility], and have been updated if relevant.   Review of Systems  Constitutional: Negative.   HENT: Negative.   Respiratory: Negative.   Cardiovascular:  Negative.   Gastrointestinal: Negative.   Musculoskeletal: Negative.   Skin: Negative.   Hematological: Negative.   Psychiatric/Behavioral: Negative.   All other systems reviewed and are negative.      Objective:    BP 112/60 mmHg  Pulse 67  Temp(Src) 98 F (36.7 C) (Oral)  Wt 203 lb 8 oz (92.307 kg)  SpO2 99%   Physical Exam  Constitutional: She is oriented to person, place, and time. She appears well-developed and well-nourished. No distress.  HENT:  Head: Normocephalic and atraumatic.  Eyes: Conjunctivae are normal.  Cardiovascular: Normal rate.   Pulmonary/Chest: Effort normal.  Musculoskeletal: Normal range of motion.  Neurological: She is alert and oriented to person, place, and time. No cranial nerve deficit.  Skin: Skin is warm and dry.  Psychiatric: She has a normal mood and affect. Her behavior is normal. Thought content normal.  Nursing note and vitals reviewed.         Assessment & Plan:   Vitamin B12 deficiency  Encounter for completion of form with patient No  Follow-up on file.

## 2015-01-12 NOTE — Assessment & Plan Note (Signed)
She is working on diet and exercise. Appeal form filled out and returned to pt.

## 2015-01-12 NOTE — Assessment & Plan Note (Signed)
IM b12 given today. 

## 2015-02-14 ENCOUNTER — Ambulatory Visit (INDEPENDENT_AMBULATORY_CARE_PROVIDER_SITE_OTHER): Payer: 59 | Admitting: *Deleted

## 2015-02-14 DIAGNOSIS — E538 Deficiency of other specified B group vitamins: Secondary | ICD-10-CM

## 2015-02-14 MED ORDER — CYANOCOBALAMIN 1000 MCG/ML IJ SOLN
1000.0000 ug | Freq: Once | INTRAMUSCULAR | Status: AC
Start: 2015-02-14 — End: 2015-02-14
  Administered 2015-02-14: 1000 ug via INTRAMUSCULAR

## 2015-03-03 ENCOUNTER — Ambulatory Visit (INDEPENDENT_AMBULATORY_CARE_PROVIDER_SITE_OTHER): Payer: 59 | Admitting: Family Medicine

## 2015-03-03 ENCOUNTER — Encounter: Payer: Self-pay | Admitting: Family Medicine

## 2015-03-03 VITALS — BP 118/64 | HR 86 | Temp 98.6°F | Ht 66.0 in | Wt 206.2 lb

## 2015-03-03 DIAGNOSIS — F341 Dysthymic disorder: Secondary | ICD-10-CM

## 2015-03-03 MED ORDER — SERTRALINE HCL 25 MG PO TABS: 25.0000 mg | ORAL_TABLET | Freq: Every day | ORAL | Status: DC

## 2015-03-03 NOTE — Patient Instructions (Signed)
Nice to meet you. We'll start her on medication for your anxiety. Please consider calling therapist as well. If you develop chest pain, shortness breath, palpitations, thoughts of harming yourself or harming others, or worsening anxiety or new or change in symptoms please seek medical attention.

## 2015-03-03 NOTE — Progress Notes (Signed)
Patient ID: Shelby CurbLaura T Ketterman, female   DOB: 08/18/1975, 39 y.o.   MRN: [id number]  Marikay AlarEric Meika Earll, MD Phone: [phone]  Shelby Rios is a 39 y.o. female who presents today for same-day visit.  Anxiety: Patient notes increasing anxiety recently. She notes she has been taking care of her mom and dad after they had several falls and injuries related to these. Her mom has been a nursing home and her dad has been going to the doctor's office weekly for follow-up of his injuries. She notes in the beginning she was doing well with this. Though this has become increasingly worse with increasing stress. The stress is worse at night. She has gotten to the point where she can't stop thinking at night. She has issues falling asleep and staying asleep. She notes she has had anxiety and depression in the past. She denies depression at this time. With these episodes of anxiety she starts to get a panicky sensation and becomes a little sweaty. She takes deep breaths to calm down. She's not had any palpitations, chest pain, or shortness of breath with this. She's not had any SI or HI.  PMH: nonsmoker.   ROS see history of present illness  Objective  Physical Exam Filed Vitals:   03/03/15 1422  BP: 118/64  Pulse: 86  Temp: 98.6 F (37 C)    Physical Exam  Constitutional: She is well-developed, well-nourished, and in no distress.  HENT:  Head: Normocephalic and atraumatic.  Cardiovascular: Normal rate, regular rhythm and normal heart sounds.  Exam reveals no gallop and no friction rub.   No murmur heard. Pulmonary/Chest: Effort normal and breath sounds normal. No respiratory distress. She has no wheezes. She has no rales.  Skin: She is not diaphoretic.  Psychiatric:  Mood anxious, affect normal     Assessment/Plan: Please see individual problem list.  DEPRESSION/ANXIETY Patient with anxiety now related to life stressors. She notes this is worse than her prior anxiety. She is interested in  starting medicine and possibly see a therapist. We will start her on Zoloft 25 mg daily. I gave her the name of a local psychologist to set up an appointment with. She was given return precautions. Follow-up with her PCP in about a month.    Meds ordered this encounter  Medications  . sertraline (ZOLOFT) 25 MG tablet    Sig: Take 1 tablet (25 mg total) by mouth daily.    Dispense:  30 tablet    Refill:  2    Marikay AlarEric Neill Jurewicz

## 2015-03-03 NOTE — Assessment & Plan Note (Signed)
Patient with anxiety now related to life stressors. She notes this is worse than her prior anxiety. She is interested in starting medicine and possibly see a therapist. We will start her on Zoloft 25 mg daily. I gave her the name of a local psychologist to set up an appointment with. She was given return precautions. Follow-up with her PCP in about a month.

## 2015-03-03 NOTE — Progress Notes (Signed)
Pre visit review using our clinic review tool, if applicable. No additional management support is needed unless otherwise documented below in the visit note. 

## 2015-03-07 ENCOUNTER — Encounter: Payer: Self-pay | Admitting: Family Medicine

## 2015-03-07 ENCOUNTER — Ambulatory Visit (INDEPENDENT_AMBULATORY_CARE_PROVIDER_SITE_OTHER): Payer: 59 | Admitting: Family Medicine

## 2015-03-07 VITALS — BP 118/70 | HR 68 | Temp 98.0°F | Wt 204.8 lb

## 2015-03-07 DIAGNOSIS — F341 Dysthymic disorder: Secondary | ICD-10-CM

## 2015-03-07 MED ORDER — ALPRAZOLAM 0.25 MG PO TABS: 0.2500 mg | ORAL_TABLET | Freq: Two times a day (BID) | ORAL | Status: DC | PRN

## 2015-03-07 NOTE — Patient Instructions (Signed)
Good to see you. Please increase your zoloft to 50 mg daily in a few days. Call me with an update.  Come see me in a month.

## 2015-03-07 NOTE — Progress Notes (Signed)
Pre visit review using our clinic review tool, if applicable. No additional management support is needed unless otherwise documented below in the visit note. 

## 2015-03-07 NOTE — Assessment & Plan Note (Signed)
>  25 minutes spent in face to face time with patient, >50% spent in counselling or coordination of care She would like to look up therapists in her network and get back to me with names to see if I know them.   Continue current dose of zoloft- ok to increase to 50 mg in a few days. Rx given for xanax to take for panic attacks or insomnia as needed. Discussed sedation or addiction potential.

## 2015-03-07 NOTE — Progress Notes (Signed)
Subjective:   Patient ID: Shelby Rios, female    DOB: February 18, 1976, 39 y.o.   MRN: [id number]  Shelby Rios is a pleasant 39 y.o. year old female who presents to clinic today with Anxiety  on 03/07/2015  HPI:  Anxiety-  Saw Dr. Caryl Bis on 03/03/15 (4 days ago) for increasing anxiety and depression.  Note reviewed:  Anxiety: Patient notes increasing anxiety recently. She notes she has been taking care of her mom and dad after they had several falls and injuries related to these. Her mom has been a nursing home and her dad has been going to the doctor's office weekly for follow-up of his injuries. She notes in the beginning she was doing well with this. Though this has become increasingly worse with increasing stress. The stress is worse at night. She has gotten to the point where she can't stop thinking at night. She has issues falling asleep and staying asleep. She notes she has had anxiety and depression in the past. She denies depression at this time. With these episodes of anxiety she starts to get a panicky sensation and becomes a little sweaty. She takes deep breaths to calm down. She's not had any palpitations, chest pain, or shortness of breath with this. She's not had any SI or HI.  He gave her rx for zoloft 25 mg daily along with contact information for psychotherapist.  She still feels very anxious, having panic attacks.  Has been taking zoloft daily.  Mainly here because she wants to know my opinion about which rx was started and whom she should see for psychotherapy.    Current Outpatient Prescriptions on File Prior to Visit  Medication Sig Dispense Refill  . cyanocobalamin (,VITAMIN B-12,) 1000 MCG/ML injection Inject 1,000 mcg into the muscle every 30 (thirty) days.    Marland Kitchen ibuprofen (ADVIL,MOTRIN) 200 MG tablet Take 200 mg by mouth every 6 (six) hours as needed. UAD     . sertraline (ZOLOFT) 25 MG tablet Take 1 tablet (25 mg total) by mouth daily. 30 tablet 2   No current  facility-administered medications on file prior to visit.    No Known Allergies  Past Medical History  Diagnosis Date  . Palpitations   . Fatigue   . Dyspepsia   . Back pain   . Depression   . Anxiety   . Breast pain, right   . Edema   . Abnormal mammogram, unspecified   . Nonspecific abnormal results of other specified function study   . Routine general medical examination at a health care facility   . Abnormal weight gain     Past Surgical History  Procedure Laterality Date  . Partial hysterectomy  2008    endometriosis  . Tonsillectomy    . Adenoidectomy    . Wisdom tooth extraction    . Dilation and curettage of uterus    . Brca test      neg    Family History  Problem Relation Age of Onset  . Ovarian cancer Mother   . Stroke Mother 68  . Cancer Mother     ovarian  . Hypokalemia Sister     deceased at 37; was getting regular IV K infusions; long Hx of pseudotumor cerebri  . Heart attack      GF - 35  . Coronary artery disease      family hx  . Hypertension      family hx  . Diabetes  family hx  . Heart disease Father     CABG age 6    Social History   Social History  . Marital Status: Married    Spouse Name: N/A  . Number of Children: 2  . 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 Smoking status: Never Smoker   . Smokeless tobacco: Not on file     Comment: nonsmoker  . Alcohol Use: No  . Drug Use: No  . Sexual Activity: Not on file   Other Topics Concern  . Not on file   Social History Narrative   Divorced since 9/08; 2 children   3 coffees/day    The PMH, PSH, Social History, Family History, Medications, and allergies have been reviewed in [facility], and have been updated if relevant.   Review of Systems  Psychiatric/Behavioral: Positive for sleep disturbance, dysphoric mood and decreased concentration. Negative for suicidal ideas, hallucinations, behavioral  problems, confusion, self-injury and agitation. The patient is nervous/anxious.   All other systems reviewed and are negative.      Objective:    BP 118/70 mmHg  Pulse 68  Temp(Src) 98 F (36.7 C) (Oral)  Wt 204 lb 12 oz (92.874 kg)  SpO2 99%   Physical Exam  Constitutional: She is oriented to person, place, and time. She appears well-developed and well-nourished. No distress.  HENT:  Head: Normocephalic.  Eyes: Conjunctivae are normal.  Cardiovascular: Normal rate.   Pulmonary/Chest: Effort normal.  Musculoskeletal: Normal range of motion.  Neurological: She is alert and oriented to person, place, and time. No cranial nerve deficit.  Skin: Skin is warm and dry.  Psychiatric: Her speech is normal and behavior is normal. Judgment and thought content normal. Her mood appears anxious. Her affect is not labile and not inappropriate. Cognition and memory are normal.  Nursing note and vitals reviewed.         Assessment & Plan:   DEPRESSION/ANXIETY No Follow-up on file.

## 2015-03-13 ENCOUNTER — Telehealth: Payer: Self-pay | Admitting: *Deleted

## 2015-03-13 MED ORDER — SERTRALINE HCL 50 MG PO TABS: 50.0000 mg | ORAL_TABLET | Freq: Every day | ORAL | Status: DC

## 2015-03-13 NOTE — Telephone Encounter (Signed)
Patient left a voicemail stating that she was advised by Dr. Dayton MartesAron to increase her Zoloft to 50 mg a day and to see how that worked and to call back with an update. Patient stated that she has made the change and would like the script changed from 25 to 50 mg at the pharmacy. Patient needs a new script sent to [facility] for the new dose.

## 2015-03-13 NOTE — Telephone Encounter (Signed)
Thanks for the update. eRx sent.  Please continue to keep us posted.

## 2015-03-15 NOTE — Telephone Encounter (Signed)
Please close encounter if completed

## 2015-03-21 ENCOUNTER — Ambulatory Visit: Payer: 59

## 2015-03-23 ENCOUNTER — Ambulatory Visit (INDEPENDENT_AMBULATORY_CARE_PROVIDER_SITE_OTHER): Payer: 59 | Admitting: *Deleted

## 2015-03-23 DIAGNOSIS — E538 Deficiency of other specified B group vitamins: Secondary | ICD-10-CM

## 2015-03-23 MED ORDER — CYANOCOBALAMIN 1000 MCG/ML IJ SOLN
1000.0000 ug | Freq: Once | INTRAMUSCULAR | Status: AC
  Administered 2015-03-23: 1000 ug via INTRAMUSCULAR

## 2015-04-11 ENCOUNTER — Ambulatory Visit (INDEPENDENT_AMBULATORY_CARE_PROVIDER_SITE_OTHER): Payer: 59 | Admitting: Family Medicine

## 2015-04-11 ENCOUNTER — Encounter: Payer: Self-pay | Admitting: Family Medicine

## 2015-04-11 VITALS — BP 104/62 | HR 64 | Temp 98.1°F | Wt 206.0 lb

## 2015-04-11 DIAGNOSIS — J069 Acute upper respiratory infection, unspecified: Secondary | ICD-10-CM | POA: Diagnosis not present

## 2015-04-11 DIAGNOSIS — F341 Dysthymic disorder: Secondary | ICD-10-CM

## 2015-04-11 MED ORDER — ALPRAZOLAM 0.25 MG PO TABS
0.2500 mg | ORAL_TABLET | Freq: Two times a day (BID) | ORAL | Status: DC | PRN
Start: 2015-04-11 — End: 2018-12-21

## 2015-04-11 NOTE — Progress Notes (Signed)
Pre visit review using our clinic review tool, if applicable. No additional management support is needed unless otherwise documented below in the visit note. 

## 2015-04-11 NOTE — Progress Notes (Signed)
Subjective:   Patient ID: Shelby Rios, female    DOB: 01/26/1976, 39 y.o.   MRN: [id number]  Shelby Rios is a pleasant 39 y.o. year old female who presents to clinic today with Follow-up and head congestion  on 04/11/2015  HPI:  Depression- started zoloft last month, increased to 50 mg daily which she is currently taking for symptoms of anxiety and depression. Also given rx for xanax to take prn panic attacks, insomnia.  She has only used it a couple of times past 2 weeks.  Having less panic attacks. She was looking for therapists in her network but has not found one yet.  URI symptoms- Cough, congestion, runny nose x 4 days.  No fevers or chills.  No CP or SOB.  Cough has been a little productive in the morning.  Current Outpatient Prescriptions on File Prior to Visit  Medication Sig Dispense Refill  . ALPRAZolam (XANAX) 0.25 MG tablet Take 1 tablet (0.25 mg total) by mouth 2 (two) times daily as needed for anxiety or sleep. 60 tablet 0  . cyanocobalamin (,VITAMIN B-12,) 1000 MCG/ML injection Inject 1,000 mcg into the muscle every 30 (thirty) days.    Marland Kitchen ibuprofen (ADVIL,MOTRIN) 200 MG tablet Take 200 mg by mouth every 6 (six) hours as needed. UAD     . sertraline (ZOLOFT) 50 MG tablet Take 1 tablet (50 mg total) by mouth daily. 30 tablet 3   No current facility-administered medications on file prior to visit.    No Known Allergies  Past Medical History  Diagnosis Date  . Palpitations   . Fatigue   . Dyspepsia   . Back pain   . Depression   . Anxiety   . Breast pain, right   . Edema   . Abnormal mammogram, unspecified   . Nonspecific abnormal results of other specified function study   . Routine general medical examination at a health care facility   . Abnormal weight gain     Past Surgical History  Procedure Laterality Date  . Partial hysterectomy  2008    endometriosis  . Tonsillectomy    . Adenoidectomy    . Wisdom tooth extraction    . Dilation and curettage  of uterus    . Brca test      neg    Family History  Problem Relation Age of Onset  . Ovarian cancer Mother   . Stroke Mother 43  . Cancer Mother     ovarian  . Hypokalemia Sister     deceased at 29; was getting regular IV K infusions; long Hx of pseudotumor cerebri  . Heart attack      GF - 35  . Coronary artery disease      family hx  . Hypertension      family hx  . Diabetes      family hx  . Heart disease Father     CABG age 18    Social History   Social History  . Marital Status: Married    Spouse Name: N/A  . Number of Children: 2  . 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 Smoking status: Never Smoker   . Smokeless tobacco: Never Used     Comment: nonsmoker  . Alcohol Use: No  . Drug Use: No  . Sexual Activity: Not on file   Other Topics Concern  . Not on file  Social History Narrative   Divorced since 9/08; 2 children   3 coffees/day    The PMH, PSH, Social History, Family History, Medications, and allergies have been reviewed in [facility], and have been updated if relevant.  Review of Systems  Constitutional: Negative.   HENT: Positive for congestion, postnasal drip, rhinorrhea, sinus pressure, sneezing and sore throat. Negative for ear pain.   Respiratory: Positive for cough. Negative for shortness of breath.   Cardiovascular: Negative.   Genitourinary: Negative.   Musculoskeletal: Negative.   Skin: Negative.   Neurological: Negative.   Hematological: Negative.   Psychiatric/Behavioral: Negative.   All other systems reviewed and are negative.      Objective:    BP 104/62 mmHg  Pulse 64  Temp(Src) 98.1 F (36.7 C) (Oral)  Wt 206 lb (93.441 kg)  SpO2 98%   Physical Exam  Constitutional: She is oriented to person, place, and time. She appears well-developed and well-nourished. No distress.  HENT:  Head: Normocephalic.  Right Ear: Hearing and tympanic membrane normal.   Left Ear: Hearing and tympanic membrane normal.  Nose: Rhinorrhea present. No mucosal edema or sinus tenderness. Right sinus exhibits no maxillary sinus tenderness and no frontal sinus tenderness. Left sinus exhibits no maxillary sinus tenderness and no frontal sinus tenderness.  Mouth/Throat: Oropharynx is clear and moist and mucous membranes are normal.  Cardiovascular: Normal rate.   Pulmonary/Chest: Effort normal and breath sounds normal.  Musculoskeletal: Normal range of motion. She exhibits no edema.  Neurological: She is alert and oriented to person, place, and time. No cranial nerve deficit.  Skin: Skin is warm and dry. She is not diaphoretic.  Psychiatric: She has a normal mood and affect. Her behavior is normal. Judgment and thought content normal.  Nursing note and vitals reviewed.         Assessment & Plan:   DEPRESSION/ANXIETY  Acute upper respiratory infection No Follow-up on file.

## 2015-04-11 NOTE — Patient Instructions (Signed)
Happy Holidays! This is likely a virus. .  Drink lots of fluids.    Treat sympotmatically with Mucinex, nasal saline irrigation, and Tylenol/Ibuprofen.   Also try an antihistamine/decongestant like claritin D or zyrtec D over the counter- two times a day as needed ( have to sign for them at pharmacy).   Try over the counter nasocort-start with 2 sprays per nostril per day...and then try to taper to 1 spray per nostril once symptoms improve.   You can use warm compresses.    Call if not improving as expected in 5-7 days.

## 2015-04-11 NOTE — Assessment & Plan Note (Signed)
Well controlled on current rx. No changes made today. Xanax rx refilled and given to pt today.

## 2015-04-11 NOTE — Assessment & Plan Note (Signed)
New- exam reassuring. Likely viral. Advised supportive care as per AVS.

## 2015-05-09 ENCOUNTER — Telehealth: Payer: Self-pay

## 2015-05-09 NOTE — Telephone Encounter (Signed)
Pt left /vm; pt has been getting B 12 injections and pt wants to know if should get B 12 level done to see if needs to continue the B12 shots or could pt give herself the injection at home. Pt request cb.

## 2015-05-09 NOTE — Telephone Encounter (Signed)
Yes let's go ahead and recheck her B12 level before we decide.

## 2015-05-10 NOTE — Telephone Encounter (Signed)
Lm on pts vm and advised per Dr Dayton Martes. Pt advised to contact office to schedule lab appt

## 2015-05-16 NOTE — Telephone Encounter (Signed)
Patient returned [facility] call.  Patient works for [employer] and would like for a lab order to be mailed to her.

## 2015-05-17 NOTE — Telephone Encounter (Signed)
Order written and on my desk. 

## 2015-05-17 NOTE — Telephone Encounter (Signed)
Order mailed to pt as requested.

## 2015-06-05 ENCOUNTER — Other Ambulatory Visit: Payer: Self-pay | Admitting: *Deleted

## 2015-06-05 MED ORDER — SERTRALINE HCL 50 MG PO TABS: 50.0000 mg | ORAL_TABLET | Freq: Every day | ORAL | Status: DC

## 2015-06-05 NOTE — Telephone Encounter (Signed)
Received faxed refill request for Sertraline from [facility] Refill sent to pharmacy electronically.

## 2015-08-07 ENCOUNTER — Ambulatory Visit (INDEPENDENT_AMBULATORY_CARE_PROVIDER_SITE_OTHER): Payer: 59 | Admitting: Family Medicine

## 2015-08-07 ENCOUNTER — Encounter: Payer: Self-pay | Admitting: Family Medicine

## 2015-08-07 VITALS — BP 118/74 | HR 77 | Temp 98.5°F | Wt 210.8 lb

## 2015-08-07 DIAGNOSIS — R5383 Other fatigue: Secondary | ICD-10-CM

## 2015-08-07 DIAGNOSIS — M2550 Pain in unspecified joint: Secondary | ICD-10-CM | POA: Diagnosis not present

## 2015-08-07 NOTE — Progress Notes (Signed)
Pre visit review using our clinic review tool, if applicable. No additional management support is needed unless otherwise documented below in the visit note. 

## 2015-08-07 NOTE — Assessment & Plan Note (Signed)
Persistent- check labs today, rule out thyroid issue, vitamin deficiency, anemia. The patient indicates understanding of these issues and agrees with the plan.  Orders Placed This Encounter  Procedures  . TSH  . T4, Free  . CBC with Differential/Platelet  . Comprehensive metabolic panel  . Vitamin B12  . Vitamin D, 25-hydroxy  . H. pylori antibody, IgG

## 2015-08-07 NOTE — Progress Notes (Signed)
Subjective:   Patient ID: Shelby Rios, female    DOB: November 23, 1975, 40 y.o.   MRN: [id number]  Shelby Rios is a pleasant 40 y.o. year old female who presents to clinic today with Fatigue  on 08/07/2015  HPI:  Several weeks of progressive fatigue.  Feels like she could sleep 10 hours a night and still not feel rested.  Typically does not snore, only on occasion.  Denies feeling depressed or anxious.  Notices she is more aware when she swallows, not really painful. Food and liquid not getting stuck.  Feels cold and like she cannot lose weight.  Having joint pain as well.  Mom has a h/o hypothyroidism.  Remote h/o hysterectomy.  Current Outpatient Prescriptions on File Prior to Visit  Medication Sig Dispense Refill  . ALPRAZolam (XANAX) 0.25 MG tablet Take 1 tablet (0.25 mg total) by mouth 2 (two) times daily as needed for anxiety or sleep. 60 tablet 0  . cyanocobalamin (,VITAMIN B-12,) 1000 MCG/ML injection Inject 1,000 mcg into the muscle every 30 (thirty) days.    Marland Kitchen ibuprofen (ADVIL,MOTRIN) 200 MG tablet Take 200 mg by mouth every 6 (six) hours as needed. UAD     . sertraline (ZOLOFT) 50 MG tablet Take 1 tablet (50 mg total) by mouth daily. 90 tablet 1   No current facility-administered medications on file prior to visit.    No Known Allergies  Past Medical History  Diagnosis Date  . Palpitations   . Fatigue   . Dyspepsia   . Back pain   . Depression   . Anxiety   . Breast pain, right   . Edema   . Abnormal mammogram, unspecified   . Nonspecific abnormal results of other specified function study   . Routine general medical examination at a health care facility   . Abnormal weight gain     Past Surgical History  Procedure Laterality Date  . Partial hysterectomy  2008    endometriosis  . Tonsillectomy    . Adenoidectomy    . Wisdom tooth extraction    . Dilation and curettage of uterus    . Brca test      neg    Family History  Problem Relation Age of  Onset  . Ovarian cancer Mother   . Stroke Mother 72  . Cancer Mother     ovarian  . Hypokalemia Sister     deceased at 44; was getting regular IV K infusions; long Hx of pseudotumor cerebri  . Heart attack      GF - 35  . Coronary artery disease      family hx  . Hypertension      family hx  . Diabetes      family hx  . Heart disease Father     CABG age 44    Social History   Social History  . Marital Status: Married    Spouse Name: N/A  . Number of Children: 2  . 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 Smoking status: Never Smoker   . Smokeless tobacco: Never Used     Comment: nonsmoker  . Alcohol Use: No  . Drug Use: No  . Sexual Activity: Not on file   Other Topics Concern  . Not on file   Social History Narrative   Divorced since 9/08; 2 children   3 coffees/day    The  PMH, PSH, Social History, Family History, Medications, and allergies have been reviewed in [facility], and have been updated if relevant.   Review of Systems  Constitutional: Positive for fatigue and unexpected weight change.  HENT: Positive for trouble swallowing. Negative for voice change.   Respiratory: Negative.   Cardiovascular: Negative.   Endocrine: Positive for cold intolerance.  Musculoskeletal: Positive for arthralgias.  Skin: Negative.   Neurological: Negative.   Hematological: Negative.   Psychiatric/Behavioral: Negative.   All other systems reviewed and are negative.      Objective:    BP 118/74 mmHg  Pulse 77  Temp(Src) 98.5 F (36.9 C) (Oral)  Wt 210 lb 12 oz (95.596 kg)  Wt Readings from Last 3 Encounters:  08/07/15 210 lb 12 oz (95.596 kg)  04/11/15 206 lb (93.441 kg)  03/07/15 204 lb 12 oz (92.874 kg)    Physical Exam  Constitutional: She is oriented to person, place, and time. She appears well-developed and well-nourished. No distress.  HENT:  Head: Atraumatic.  Eyes: Conjunctivae are  normal.  Neck: Normal range of motion. Thyromegaly present.  Cardiovascular: Normal rate and regular rhythm.   Pulmonary/Chest: Effort normal and breath sounds normal.  Musculoskeletal: Normal range of motion.  Neurological: She is alert and oriented to person, place, and time. No cranial nerve deficit.  No tremor  Skin: Skin is warm and dry. She is not diaphoretic.  Psychiatric: She has a normal mood and affect. Her behavior is normal. Thought content normal.  Nursing note and vitals reviewed.         Assessment & Plan:   Other fatigue - Plan: TSH, T4, Free, CBC with Differential/Platelet, Comprehensive metabolic panel, Vitamin V61, Vitamin D, 25-hydroxy  Arthralgia No Follow-up on file.

## 2015-08-08 LAB — VITAMIN D 25 HYDROXY (VIT D DEFICIENCY, FRACTURES): VIT D 25 HYDROXY: 19.1 ng/mL — AB (ref 30.0–100.0)

## 2015-08-08 LAB — COMPREHENSIVE METABOLIC PANEL
ALT: 17 IU/L (ref 0–32)
AST: 16 IU/L (ref 0–40)
Albumin/Globulin Ratio: 1.4 (ref 1.2–2.2)
Albumin: 4.1 g/dL (ref 3.5–5.5)
Alkaline Phosphatase: 79 IU/L (ref 39–117)
BUN/Creatinine Ratio: 19 (ref 9–23)
BUN: 12 mg/dL (ref 6–20)
Bilirubin Total: <0.2 mg/dL (ref 0.0–1.2)
CO2: 24 mmol/L (ref 18–29)
Calcium: 9.2 mg/dL (ref 8.7–10.2)
Chloride: 99 mmol/L (ref 96–106)
Creatinine, Ser: 0.62 mg/dL (ref 0.57–1.00)
GFR, EST AFRICAN AMERICAN: 131 mL/min/{1.73_m2} (ref >59)
GFR, EST NON AFRICAN AMERICAN: 114 mL/min/{1.73_m2} (ref >59)
Globulin, Total: 3 g/dL (ref 1.5–4.5)
Glucose: 96 mg/dL (ref 65–99)
Potassium: 4 mmol/L (ref 3.5–5.2)
Sodium: 139 mmol/L (ref 134–144)
TOTAL PROTEIN: 7.1 g/dL (ref 6.0–8.5)

## 2015-08-08 LAB — CBC WITH DIFFERENTIAL/PLATELET
BASOS: 0 %
Basophils Absolute: 0 10*3/uL (ref 0.0–0.2)
EOS (ABSOLUTE): 0.3 10*3/uL (ref 0.0–0.4)
EOS: 3 %
HEMATOCRIT: 37.9 % (ref 34.0–46.6)
Hemoglobin: 12.3 g/dL (ref 11.1–15.9)
IMMATURE GRANS (ABS): 0 10*3/uL (ref 0.0–0.1)
IMMATURE GRANULOCYTES: 0 %
LYMPHS: 23 %
Lymphocytes Absolute: 2.1 10*3/uL (ref 0.7–3.1)
MCH: 28.1 pg (ref 26.6–33.0)
MCHC: 32.5 g/dL (ref 31.5–35.7)
MCV: 87 fL (ref 79–97)
MONOS ABS: 1 10*3/uL — AB (ref 0.1–0.9)
Monocytes: 11 %
NEUTROS PCT: 63 %
Neutrophils Absolute: 5.9 10*3/uL (ref 1.4–7.0)
PLATELETS: 317 10*3/uL (ref 150–379)
RBC: 4.37 x10E6/uL (ref 3.77–5.28)
RDW: 14.1 % (ref 12.3–15.4)
WBC: 9.4 10*3/uL (ref 3.4–10.8)

## 2015-08-08 LAB — T4, FREE: Free T4: 1.14 ng/dL (ref 0.82–1.77)

## 2015-08-08 LAB — TSH: TSH: 1.98 u[IU]/mL (ref 0.450–4.500)

## 2015-08-08 LAB — H. PYLORI ANTIBODY, IGG: H PYLORI IGG: 1.1 U/mL — AB (ref 0.0–0.8)

## 2015-08-08 LAB — VITAMIN B12: Vitamin B-12: 519 pg/mL (ref 211–946)

## 2015-08-09 ENCOUNTER — Other Ambulatory Visit: Payer: Self-pay | Admitting: Family Medicine

## 2015-08-09 MED ORDER — VITAMIN D (ERGOCALCIFEROL) 1.25 MG (50000 UNIT) PO CAPS: 50000.0000 [IU] | ORAL_CAPSULE | ORAL | Status: DC

## 2015-08-09 MED ORDER — CLARITHROMYCIN 500 MG PO TABS: 500.0000 mg | ORAL_TABLET | Freq: Two times a day (BID) | ORAL | Status: DC

## 2015-08-09 MED ORDER — AMOXICILLIN 500 MG PO CAPS: 1000.0000 mg | ORAL_CAPSULE | Freq: Two times a day (BID) | ORAL | Status: DC

## 2015-08-09 MED ORDER — OMEPRAZOLE 20 MG PO CPDR: 20.0000 mg | DELAYED_RELEASE_CAPSULE | Freq: Two times a day (BID) | ORAL | Status: DC

## 2015-08-22 ENCOUNTER — Telehealth: Payer: Self-pay

## 2015-08-22 NOTE — Telephone Encounter (Signed)
Yes please schedule an earlier follow up for her. Thanks!

## 2015-08-22 NOTE — Telephone Encounter (Signed)
Pt left /vm; pt was seen 08/07/15 with + h pylori; pt has f/u scheduled with Dr Dayton MartesAron on 09/11/15. Pt continues with fatigue and seems worse after eating a meal. Pt request cb to see if needs to be seen prior to 09/11/15 or what to do.

## 2015-08-23 NOTE — Telephone Encounter (Signed)
Lm on pts vm and informed pt that per Dr Dayton MartesAron she is needing to be seen sooner. Request pt contact office and schedule appt

## 2015-08-24 ENCOUNTER — Ambulatory Visit: Payer: 59 | Admitting: Family Medicine

## 2015-08-24 ENCOUNTER — Ambulatory Visit (INDEPENDENT_AMBULATORY_CARE_PROVIDER_SITE_OTHER): Payer: 59 | Admitting: Internal Medicine

## 2015-08-24 ENCOUNTER — Encounter: Payer: Self-pay | Admitting: Internal Medicine

## 2015-08-24 VITALS — BP 114/78 | HR 78 | Temp 98.2°F | Wt 211.2 lb

## 2015-08-24 DIAGNOSIS — R5383 Other fatigue: Secondary | ICD-10-CM

## 2015-08-24 DIAGNOSIS — Z833 Family history of diabetes mellitus: Secondary | ICD-10-CM

## 2015-08-24 LAB — HEMOGLOBIN A1C: Hgb A1c MFr Bld: 5.4 % (ref 4.6–6.5)

## 2015-08-24 NOTE — Patient Instructions (Signed)
Fatigue  Fatigue is feeling tired all of the time, a lack of energy, or a lack of motivation. Occasional or mild fatigue is often a normal response to activity or life in general. However, long-lasting (chronic) or extreme fatigue may indicate an underlying medical condition.  HOME CARE INSTRUCTIONS   Watch your fatigue for any changes. The following actions may help to lessen any discomfort you are feeling:  · Talk to your health care provider about how much sleep you need each night. Try to get the required amount every night.  · Take medicines only as directed by your health care provider.  · Eat a healthy and nutritious diet. Ask your health care provider if you need help changing your diet.  · Drink enough fluid to keep your urine clear or pale yellow.  · Practice ways of relaxing, such as yoga, meditation, massage therapy, or acupuncture.  · Exercise regularly.    · Change situations that cause you stress. Try to keep your work and personal routine reasonable.  · Do not abuse illegal drugs.  · Limit alcohol intake to no more than 1 drink per day for nonpregnant women and 2 drinks per day for men. One drink equals 12 ounces of beer, 5 ounces of wine, or 1½ ounces of hard liquor.  · Take a multivitamin, if directed by your health care provider.  SEEK MEDICAL CARE IF:   · Your fatigue does not get better.  · You have a fever.    · You have unintentional weight loss or gain.  · You have headaches.    · You have difficulty:      Falling asleep.    Sleeping throughout the night.  · You feel angry, guilty, anxious, or sad.     · You are unable to have a bowel movement (constipation).    · You skin is dry.     · Your legs or another part of your body is swollen.    SEEK IMMEDIATE MEDICAL CARE IF:   · You feel confused.    · Your vision is blurry.  · You feel faint or pass out.    · You have a severe headache.    · You have severe abdominal, pelvic, or back pain.    · You have chest pain, shortness of breath, or an  irregular or fast heartbeat.    · You are unable to urinate or you urinate less than normal.    · You develop abnormal bleeding, such as bleeding from the rectum, vagina, nose, lungs, or nipples.  · You vomit blood.     · You have thoughts about harming yourself or committing suicide.    · You are worried that you might harm someone else.       This information is not intended to replace advice given to you by your health care provider. Make sure you discuss any questions you have with your health care provider.     Document Released: 02/03/2007 Document Revised: 04/29/2014 Document Reviewed: 08/10/2013  Elsevier Interactive Patient Education ©2016 [employer]vier Inc[employer]

## 2015-08-24 NOTE — Progress Notes (Signed)
Subjective:    Patient ID: Shelby CurbLaura T Toepfer, female    DOB: 08/08/1975, 40 y.o.   MRN: [id number]  HPI  Pt presents to the clinic today with c/o ongoing fatigue and new onset of increased post-prandial fatigue since her visit with Dr. Dayton MartesAron (08/07/2015). She was diagnosed with H. Pylori and Vit D deficiency, and placed on abx, PPI and Vit D supplement. She reports that, following lunch and mid-afternoon snacks, she notices increased fatigue, but she does not notice it after breakfast or dinner. Since her appt, she has decreased her coffee consumption form ~4 cups/day to 1 cup/day. She also reports increased hunger, usually in the morning when she wakes up. She reports increased urination without increased fluid intake. Pt's MGM, MGF, PGM, PGF, mother and father all have diabetes, and she is concerned that her symptoms are c/w with diabetes. She states her last work health evaluation said she was borderline diabetic.   Review of Systems  Past Medical History  Diagnosis Date  . Palpitations   . Fatigue   . Dyspepsia   . Back pain   . Depression   . Anxiety   . Breast pain, right   . Edema   . Abnormal mammogram, unspecified   . Nonspecific abnormal results of other specified function study   . Routine general medical examination at a health care facility   . Abnormal weight gain     Current Outpatient Prescriptions  Medication Sig Dispense Refill  . ALPRAZolam (XANAX) 0.25 MG tablet Take 1 tablet (0.25 mg total) by mouth 2 (two) times daily as needed for anxiety or sleep. 60 tablet 0  . amoxicillin (AMOXIL) 500 MG capsule Take 2 capsules (1,000 mg total) by mouth 2 (two) times daily. 56 capsule 0  . clarithromycin (BIAXIN) 500 MG tablet Take 1 tablet (500 mg total) by mouth 2 (two) times daily. 28 tablet 0  . cyanocobalamin (,VITAMIN B-12,) 1000 MCG/ML injection Inject 1,000 mcg into the muscle every 30 (thirty) days.    Marland Kitchen. ibuprofen (ADVIL,MOTRIN) 200 MG tablet Take 200 mg by mouth every 6  (six) hours as needed. UAD     . omeprazole (PRILOSEC) 20 MG capsule Take 1 capsule (20 mg total) by mouth 2 (two) times daily before a meal. 28 capsule 3  . sertraline (ZOLOFT) 50 MG tablet Take 1 tablet (50 mg total) by mouth daily. 90 tablet 1  . Vitamin D, Ergocalciferol, (DRISDOL) 50000 units CAPS capsule Take 1 capsule (50,000 Units total) by mouth every 7 (seven) days. 7 capsule 0   No current facility-administered medications for this visit.    No Known Allergies  Family History  Problem Relation Age of Onset  . Ovarian cancer Mother   . Stroke Mother 6232  . Cancer Mother     ovarian  . Hypokalemia Sister     deceased at 6028; was getting regular IV K infusions; long Hx of pseudotumor cerebri  . Heart attack      GF - 35  . Coronary artery disease      family hx  . Hypertension      family hx  . Diabetes      family hx  . Heart disease Father     CABG age 40    Social History   Social History  . Marital Status: Married    Spouse Name: N/A  . Number of Children: 2  . Years of Education: N/A   Occupational History  . Best boyfinancial analyst [employer]   Social History Main Topics  . Smoking status: Never Smoker   . Smokeless tobacco: Never Used     Comment: nonsmoker  . Alcohol Use: No  . Drug Use: No  . Sexual Activity: Not on file   Other Topics Concern  . Not on file   Social History Narrative   Divorced since 9/08; 2 children   3 coffees/day      Constitutional: Post-prandial fatigue. Denies fever, headache or abrupt weight changes.  Gastrointestinal: Reports increased hunger. Denies abdominal pain, bloating, nausea, vomiting, constipation, or diarrhea.  GU: Increased frequency. Denies urgency, pain with urination, burning sensation, or odor.  No other specific complaints in a complete review of systems (except as listed in HPI above).     Objective:   Physical Exam  BP 114/78 mmHg  Pulse 78  Temp(Src) 98.2 F (36.8 C) (Oral)  Wt 211 lb 4 oz  (95.822 kg)  SpO2 98% Wt Readings from Last 3 Encounters:  08/24/15 211 lb 4 oz (95.822 kg)  08/07/15 210 lb 12 oz (95.596 kg)  04/11/15 206 lb (93.441 kg)    General: Appears her stated age, obese, and  in NAD. Cardiovascular: Normal rate and rhythm. S1,S2 noted.  No murmur, rubs or gallops noted.  Pulmonary/Chest: Normal effort and positive vesicular breath sounds. No respiratory distress. No wheezes, rales or ronchi noted.  Abdomen: Soft and nontender.   BMET    Component Value Date/Time   NA 139 08/07/2015 1522   NA 140 03/10/2007 0505   K 4.0 08/07/2015 1522   CL 99 08/07/2015 1522   CO2 24 08/07/2015 1522   GLUCOSE 96 08/07/2015 1522   GLUCOSE 102* 03/10/2007 0505   BUN 12 08/07/2015 1522   BUN 4* 03/10/2007 0505   CREATININE 0.62 08/07/2015 1522   CREATININE 0.7 10/28/2014   CALCIUM 9.2 08/07/2015 1522   GFRNONAA 114 08/07/2015 1522   GFRAA 131 08/07/2015 1522    Lipid Panel     Component Value Date/Time   CHOL 171 10/28/2014   TRIG 50 10/28/2014   HDL 47 10/28/2014   LDLCALC 114 10/28/2014    CBC    Component Value Date/Time   WBC 9.4 08/07/2015 1522   WBC 10.4 03/10/2007 0505   RBC 4.37 08/07/2015 1522   RBC 3.80* 03/10/2007 0505   HGB 12.4 10/28/2014   HCT 37.9 08/07/2015 1522   HCT 37 10/28/2014   PLT 317 08/07/2015 1522   PLT 285 03/10/2007 0505   MCV 87 08/07/2015 1522   MCV 87 12/24/2011 1257   MCH 28.1 08/07/2015 1522   MCHC 32.5 08/07/2015 1522   MCHC 33.9 03/10/2007 0505   RDW 14.1 08/07/2015 1522   RDW 13.3 03/10/2007 0505   LYMPHSABS 2.1 08/07/2015 1522   EOSABS 0.3 08/07/2015 1522   EOSABS 0.0 12/24/2011 1257   BASOSABS 0.0 08/07/2015 1522    Hgb A1C Lab Results  Component Value Date   HGBA1C 5.3 12/24/2011         Assessment & Plan:  Fatigue:  Ongoing issue Check A1C today although her glucose levels have been normal Continue H. Pylori and Vit D treatments and monitor for improvement after they're finished  RTC as  needed if sxs persist or worsen

## 2015-08-24 NOTE — Progress Notes (Signed)
Pre visit review using our clinic review tool, if applicable. No additional management support is needed unless otherwise documented below in the visit note. 

## 2015-09-11 ENCOUNTER — Ambulatory Visit: Payer: 59 | Admitting: Family Medicine

## 2015-09-19 ENCOUNTER — Encounter: Payer: Self-pay | Admitting: Family Medicine

## 2015-09-19 ENCOUNTER — Ambulatory Visit (INDEPENDENT_AMBULATORY_CARE_PROVIDER_SITE_OTHER): Payer: 59 | Admitting: Family Medicine

## 2015-09-19 VITALS — BP 126/70 | HR 76 | Temp 98.2°F | Wt 215.8 lb

## 2015-09-19 DIAGNOSIS — E559 Vitamin D deficiency, unspecified: Secondary | ICD-10-CM

## 2015-09-19 DIAGNOSIS — R5383 Other fatigue: Secondary | ICD-10-CM | POA: Insufficient documentation

## 2015-09-19 DIAGNOSIS — R0683 Snoring: Secondary | ICD-10-CM | POA: Diagnosis not present

## 2015-09-19 DIAGNOSIS — B9681 Helicobacter pylori [H. pylori] as the cause of diseases classified elsewhere: Secondary | ICD-10-CM

## 2015-09-19 DIAGNOSIS — A048 Other specified bacterial intestinal infections: Secondary | ICD-10-CM | POA: Insufficient documentation

## 2015-09-19 LAB — VITAMIN D 25 HYDROXY (VIT D DEFICIENCY, FRACTURES): VITD: 18.98 ng/mL — AB (ref 30.00–100.00)

## 2015-09-19 NOTE — Progress Notes (Signed)
Subjective:   Patient ID: Shelby Rios, female    DOB: 1975/09/12, 40 y.o.   MRN: [id number]  Shelby Rios is a pleasant 40 y.o. year old female who presents to clinic today with Follow-up and Fatigue  on 09/19/2015  HPI:  I saw her on 08/07/15 for progressive fatigue. Note reviewed.  Thyroid function was normal.  She did test positive for H Pylori and Vitamin D was also low.  Treated her for H Pylori with amoxicillin, omeprazole, clarithromycin and placed her on high dose vitamin D.  She then saw my partner, Webb Silversmith on 08/24/15 for persistent fatigue after eating.  Note reviewed.  a1c checked which was also normal.  GI issues have all resolved.  She initially thought her fatigue was improving but over past couple of weeks, she thinks it is getting worse again.  Last time I asked her to check with her husband to see if he hears her snore and she said that she does snore and sometimes does sound like she stops breathing.  Lab Results  Component Value Date   HGBA1C 5.4 08/24/2015   Current Outpatient Prescriptions on File Prior to Visit  Medication Sig Dispense Refill  . ALPRAZolam (XANAX) 0.25 MG tablet Take 1 tablet (0.25 mg total) by mouth 2 (two) times daily as needed for anxiety or sleep. 60 tablet 0  . cyanocobalamin (,VITAMIN B-12,) 1000 MCG/ML injection Inject 1,000 mcg into the muscle every 30 (thirty) days.    Marland Kitchen ibuprofen (ADVIL,MOTRIN) 200 MG tablet Take 200 mg by mouth every 6 (six) hours as needed. UAD     . omeprazole (PRILOSEC) 20 MG capsule Take 1 capsule (20 mg total) by mouth 2 (two) times daily before a meal. 28 capsule 3  . sertraline (ZOLOFT) 50 MG tablet Take 1 tablet (50 mg total) by mouth daily. 90 tablet 1   No current facility-administered medications on file prior to visit.    No Known Allergies  Past Medical History  Diagnosis Date  . Palpitations   . Fatigue   . Dyspepsia   . Back pain   . Depression   . Anxiety   . Breast pain, right    . Edema   . Abnormal mammogram, unspecified   . Nonspecific abnormal results of other specified function study   . Routine general medical examination at a health care facility   . Abnormal weight gain     Past Surgical History  Procedure Laterality Date  . Partial hysterectomy  2008    endometriosis  . Tonsillectomy    . Adenoidectomy    . Wisdom tooth extraction    . Dilation and curettage of uterus    . Brca test      neg    Family History  Problem Relation Age of Onset  . Ovarian cancer Mother   . Stroke Mother 41  . Cancer Mother     ovarian  . Hypokalemia Sister     deceased at 49; was getting regular IV K infusions; long Hx of pseudotumor cerebri  . Heart attack      GF - 35  . Coronary artery disease      family hx  . Hypertension      family hx  . Diabetes      family hx  . Heart disease Father     CABG age 39    Social History   Social History  . Marital Status: Married    Spouse Name:  N/A  . Number of Children: 2  . 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 Smoking status: Never Smoker   . Smokeless tobacco: Never Used     Comment: nonsmoker  . Alcohol Use: No  . Drug Use: No  . Sexual Activity: Not on file   Other Topics Concern  . Not on file   Social History Narrative   Divorced since 9/08; 2 children   3 coffees/day    The PMH, PSH, Social History, Family History, Medications, and allergies have been reviewed in [facility], and have been updated if relevant.   Review of Systems  Constitutional: Positive for fatigue.  HENT: Negative.   Respiratory: Negative.   Cardiovascular: Negative.   Gastrointestinal: Negative.   Genitourinary: Negative.   Musculoskeletal: Negative.   Skin: Negative.   Allergic/Immunologic: Negative.   Neurological: Negative.   Psychiatric/Behavioral: Negative.   All other systems reviewed and are negative.      Objective:    BP 126/70  mmHg  Pulse 76  Temp(Src) 98.2 F (36.8 C) (Oral)  Wt 215 lb 12 oz (97.864 kg)  SpO2 97%  Wt Readings from Last 3 Encounters:  09/19/15 215 lb 12 oz (97.864 kg)  08/24/15 211 lb 4 oz (95.822 kg)  08/07/15 210 lb 12 oz (95.596 kg)    Physical Exam  Constitutional: She is oriented to person, place, and time. She appears well-developed and well-nourished. No distress.  HENT:  Head: Normocephalic.  Eyes: Conjunctivae are normal.  Cardiovascular: Normal rate.   Pulmonary/Chest: Effort normal.  Musculoskeletal: Normal range of motion.  Neurological: She is alert and oriented to person, place, and time. No cranial nerve deficit.  Skin: Skin is warm and dry. She is not diaphoretic.  Psychiatric: She has a normal mood and affect. Her behavior is normal. Judgment and thought content normal.  Nursing note and vitals reviewed.         Assessment & Plan:   H. pylori infection - Plan: Helicobacter pylori antigen det, stool  Vitamin D deficiency - Plan: Vitamin D, 25-hydroxy  Other fatigue - Plan: Vitamin D, 25-hydroxy No Follow-up on file.

## 2015-09-19 NOTE — Assessment & Plan Note (Signed)
S/p 14 days of abx/PPI. Check fecal antigen test to confirm eradication. The patient indicates understanding of these issues and agrees with the plan.

## 2015-09-19 NOTE — Patient Instructions (Signed)
Good to see you.  We will call you with your results.  Please stop by to see Revonda StandardAllison on your way out.

## 2015-09-19 NOTE — Assessment & Plan Note (Signed)
She is taking OTC vit D, finished course of high dose weekly vit D. Recheck Vit D today. The patient indicates understanding of these issues and agrees with the plan.

## 2015-09-19 NOTE — Assessment & Plan Note (Signed)
With persistent fatigue. Refer to pulm for sleep study. The patient indicates understanding of these issues and agrees with the plan.

## 2015-09-19 NOTE — Progress Notes (Signed)
Pre visit review using our clinic review tool, if applicable. No additional management support is needed unless otherwise documented below in the visit note. 

## 2015-09-19 NOTE — Assessment & Plan Note (Signed)
Persistent. Likely multifactorial. See below.

## 2015-09-20 MED ORDER — VITAMIN D (ERGOCALCIFEROL) 1.25 MG (50000 UNIT) PO CAPS: 50000.0000 [IU] | ORAL_CAPSULE | ORAL | Status: DC

## 2015-09-20 NOTE — Addendum Note (Signed)
Addended by: Desmond DikeKNIGHT, Morayo Leven H on: 09/20/2015 09:21 AM   Modules accepted: Orders

## 2015-09-21 ENCOUNTER — Other Ambulatory Visit: Payer: Self-pay | Admitting: Family Medicine

## 2015-09-22 LAB — HELICOBACTER PYLORI  SPECIAL ANTIGEN: H. PYLORI ANTIGEN STOOL: NOT DETECTED

## 2015-09-28 ENCOUNTER — Encounter: Payer: Self-pay | Admitting: Internal Medicine

## 2015-09-28 ENCOUNTER — Ambulatory Visit (INDEPENDENT_AMBULATORY_CARE_PROVIDER_SITE_OTHER): Payer: 59 | Admitting: Internal Medicine

## 2015-09-28 VITALS — BP 124/88 | HR 69 | Ht 66.0 in | Wt 217.0 lb

## 2015-09-28 DIAGNOSIS — G4719 Other hypersomnia: Secondary | ICD-10-CM

## 2015-09-28 NOTE — Progress Notes (Signed)
[facility] Consultation      Assessment and Plan:  Excessive daytime sleepiness. --Symptoms and signs of obstructive sleep apnea, with short term memory loss.  --Will send for sleep study.   Snoring. --Witnessed apneas.   Sinus tachycardia --History of tachycardia seen at night on holter monitor several years ago.   Date: 09/28/2015  MRN# [id number] Shelby Rios 29-Nov-1975  Referring Physician: Dr. Tanja Port.   Shelby Rios is a 40 y.o. old female seen in consultation for chief complaint of:    Chief Complaint  Patient presents with  . Advice Only    ref Talia: sleep consult; feels she sleeps alot; tiredness during day; spouse states snoring has gotten worse; wakes up gasping for air    HPI:   The patient is a 40 yo female referred with symptoms of sleepiness, snoring, witnessed apneas.Her Epworth score is 15 today. The patient notes that she is feeling exhausted after waking up in the morning. She goes to bed between 10 and 11 PM and falls asleep almost immediately. She wakes up 2-3 times per night, and typically gets out of bed between 5:30 AM and 6 AM.  Fatigue has been going on for about 5 months, he sleep patterns had not changed, she is actually sleeping . She takes occasional naps on weekends. She gets an extra hour of sleep on weekends but feels no better. On one occasion she slept for 10 hours and felt no better.  Husband notest hat snoring has increased, and he has had to wake her up.  Her weight has increased about 10 -15 lbs in the past year.  No sleepwalking. She notes her memory has gotten worse in the past year.  Her sister and mom both have been diagnosed with OSA.   PMHX:   Past Medical History  Diagnosis Date  . Palpitations   . Fatigue   . Dyspepsia   . Back pain   . Depression   . Anxiety   . Breast pain, right   . Edema   . Abnormal mammogram, unspecified   . Nonspecific abnormal results of other specified function study   .  Routine general medical examination at a health care facility   . Abnormal weight gain    Surgical Hx:  Past Surgical History  Procedure Laterality Date  . Partial hysterectomy  2008    endometriosis  . Tonsillectomy    . Adenoidectomy    . Wisdom tooth extraction    . Dilation and curettage of uterus    . Brca test      neg   Family Hx:  Family History  Problem Relation Age of Onset  . Ovarian cancer Mother   . Stroke Mother 78  . Cancer Mother     ovarian  . Hypokalemia Sister     deceased at 40; was getting regular IV K infusions; long Hx of pseudotumor cerebri  . Heart attack      GF - 35  . Coronary artery disease      family hx  . Hypertension      family hx  . Diabetes      family hx  . Heart disease Father     CABG age 45   Social Hx:   Social History  Substance Use Topics  . Smoking status: Never Smoker   . Smokeless tobacco: Never Used     Comment: nonsmoker  . Alcohol Use: No   Medication:   Current Outpatient  Rx  Name  Route  Sig  Dispense  Refill  . ALPRAZolam (XANAX) 0.25 MG tablet   Oral   Take 1 tablet (0.25 mg total) by mouth 2 (two) times daily as needed for anxiety or sleep.   60 tablet   0   . cyanocobalamin (,VITAMIN B-12,) 1000 MCG/ML injection   Intramuscular   Inject 1,000 mcg into the muscle every 30 (thirty) days.         Marland Kitchen ibuprofen (ADVIL,MOTRIN) 200 MG tablet   Oral   Take 200 mg by mouth every 6 (six) hours as needed. UAD          . omeprazole (PRILOSEC) 20 MG capsule   Oral   Take 1 capsule (20 mg total) by mouth 2 (two) times daily before a meal.   28 capsule   3   . sertraline (ZOLOFT) 50 MG tablet   Oral   Take 1 tablet (50 mg total) by mouth daily.   90 tablet   1   . Vitamin D, Ergocalciferol, (DRISDOL) 50000 units CAPS capsule   Oral   Take 1 capsule (50,000 Units total) by mouth every 7 (seven) days.   10 capsule   0       Allergies:  Review of patient's allergies indicates no known  allergies.  Review of Systems: Gen:  Denies  fever, sweats, chills HEENT: Denies blurred vision, double vision. bleeds,  Cvc:  No dizziness, chest pain. Resp:   Denies cough or sputum production, shortness of breath Gi: Denies swallowing difficulty, stomach pain. Gu:  Denies bladder incontinence, burning urine Ext:   No Joint pain, stiffness. Skin: No skin rash,  hives  Endoc:  No polyuria, polydipsia. Psych: No depression, insomnia. Other:  All other systems were reviewed with the patient and were negative other that what is mentioned in the HPI.   Physical Examination:   VS: BP 124/88 mmHg  Pulse 69  Ht '5\' 6"'$  (1.676 m)  Wt 217 lb (98.431 kg)  BMI 35.04 kg/m2  SpO2 99%  General Appearance: No distress  Neuro:without focal findings,  speech normal,  HEENT: PERRLA, EOM intact.  Malimpatti 3.  Pulmonary: normal breath sounds, No wheezing.  CardiovascularNormal S1,S2.  No m/r/g.   Abdomen: Benign, Soft, non-tender. Renal:  No costovertebral tenderness  GU:  No performed at this time. Endoc: No evident thyromegaly, no signs of acromegaly. Skin:   warm, no rashes, no ecchymosis  Extremities: normal, no cyanosis, clubbing.  Other findings:    LABORATORY PANEL:   CBC No results for input(s): WBC, HGB, HCT, PLT in the last 168 hours. ------------------------------------------------------------------------------------------------------------------  Chemistries  No results for input(s): NA, K, CL, CO2, GLUCOSE, BUN, CREATININE, CALCIUM, MG, AST, ALT, ALKPHOS, BILITOT in the last 168 hours.  Invalid input(s): GFRCGP ------------------------------------------------------------------------------------------------------------------  Cardiac Enzymes No results for input(s): TROPONINI in the last 168 hours. ------------------------------------------------------------  RADIOLOGY:  No results found.     Thank  you for the consultation and for allowing [facility] to assist in the care of your patient. Our recommendations are noted above.  Please contact us if we can be of further service.   Marda Stalker, MD.  Board Certified in Internal Medicine, Pulmonary Medicine, [place], and Sleep Medicine.  [facility] Office Number: [phone]  Patricia Pesa, M.D.  Vilinda Boehringer, M.D.  Merton Border, M.D  09/28/2015

## 2015-09-28 NOTE — Patient Instructions (Signed)
Will send for sleep study.    Sleep Apnea Sleep apnea is disorder that affects a person's sleep. A person with sleep apnea has abnormal pauses in their breathing when they sleep. It is hard for them to get a good sleep. This makes a person tired during the day. It also can lead to other physical problems. There are three types of sleep apnea. One type is when breathing stops for a short time because your airway is blocked (obstructive sleep apnea). Another type is when the brain sometimes fails to give the normal signal to breathe to the muscles that control your breathing (central sleep apnea). The third type is a combination of the other two types. HOME CARE   Take all medicine as told by your doctor.  Avoid alcohol, calming medicines (sedatives), and depressant drugs.  Try to lose weight if you are overweight. Talk to your doctor about a healthy weight goal.  Your doctor may have you use a device that helps to open your airway. It can help you get the air that you need. It is called a positive airway pressure (PAP) device.   MAKE SURE YOU:   Understand these instructions.  Will watch your condition.  Will get help right away if you are not doing well or get worse.  It may take approximately 1 month for you to get used to wearing her CPAP every night.   

## 2015-09-28 NOTE — Addendum Note (Signed)
Addended by: Meyer CoryAHMAD, Kayonna Lawniczak R on: 09/28/2015 09:45 AM   Modules accepted: Orders

## 2015-10-10 DIAGNOSIS — G4733 Obstructive sleep apnea (adult) (pediatric): Secondary | ICD-10-CM

## 2015-10-16 ENCOUNTER — Other Ambulatory Visit: Payer: Self-pay | Admitting: *Deleted

## 2015-10-16 DIAGNOSIS — G4719 Other hypersomnia: Secondary | ICD-10-CM

## 2015-10-19 ENCOUNTER — Telehealth: Payer: Self-pay | Admitting: *Deleted

## 2015-10-19 DIAGNOSIS — G4733 Obstructive sleep apnea (adult) (pediatric): Secondary | ICD-10-CM

## 2015-10-19 NOTE — Telephone Encounter (Signed)
-----   Message from Shane CrutchPradeep Ramachandran, MD sent at 10/18/2015  1:32 PM EDT ----- Auto 5-20 ----- Message -----    From: Renea EeMisty R Ahmad, LPN    Sent: 1/61/09606/28/2017   8:48 AM      To: Shane CrutchPradeep Ramachandran, MD  Pt will have to do an auto setting due to her insurance for her CPAP. What auto setting would you like for pt to be setup on? Thanks. Misty

## 2015-10-19 NOTE — Telephone Encounter (Signed)
CPAP ordered. Nothing further needed.  

## 2015-10-23 ENCOUNTER — Telehealth: Payer: Self-pay | Admitting: Family Medicine

## 2015-10-23 NOTE — Telephone Encounter (Signed)
Rx written and in your box.

## 2015-10-23 NOTE — Telephone Encounter (Signed)
Orders mailed as requested.

## 2015-10-23 NOTE — Telephone Encounter (Signed)
Patient scheduled an appointment for a physical with Dr.Aron on 11/06/15.  Patient works for [employer] please mail lab order to [facility] [employer],so she can have her labs done before her physical.

## 2015-11-03 ENCOUNTER — Other Ambulatory Visit: Payer: Self-pay | Admitting: Family Medicine

## 2015-11-04 LAB — CBC WITH DIFFERENTIAL/PLATELET
BASOS ABS: 0 10*3/uL (ref 0.0–0.2)
Basos: 0 %
EOS (ABSOLUTE): 0.1 10*3/uL (ref 0.0–0.4)
Eos: 3 %
Hematocrit: 36.9 % (ref 34.0–46.6)
Hemoglobin: 12.5 g/dL (ref 11.1–15.9)
Immature Grans (Abs): 0 10*3/uL (ref 0.0–0.1)
Immature Granulocytes: 0 %
LYMPHS ABS: 1.6 10*3/uL (ref 0.7–3.1)
Lymphs: 32 %
MCH: 29.1 pg (ref 26.6–33.0)
MCHC: 33.9 g/dL (ref 31.5–35.7)
MCV: 86 fL (ref 79–97)
MONOCYTES: 10 %
MONOS ABS: 0.5 10*3/uL (ref 0.1–0.9)
NEUTROS ABS: 2.8 10*3/uL (ref 1.4–7.0)
Neutrophils: 55 %
PLATELETS: 282 10*3/uL (ref 150–379)
RBC: 4.3 x10E6/uL (ref 3.77–5.28)
RDW: 13.7 % (ref 12.3–15.4)
WBC: 5.1 10*3/uL (ref 3.4–10.8)

## 2015-11-04 LAB — COMPREHENSIVE METABOLIC PANEL
A/G RATIO: 1.3 (ref 1.2–2.2)
ALK PHOS: 78 IU/L (ref 39–117)
ALT: 15 IU/L (ref 0–32)
AST: 16 IU/L (ref 0–40)
Albumin: 3.9 g/dL (ref 3.5–5.5)
BILIRUBIN TOTAL: 0.2 mg/dL (ref 0.0–1.2)
BUN/Creatinine Ratio: 10 (ref 9–23)
BUN: 6 mg/dL (ref 6–24)
CHLORIDE: 100 mmol/L (ref 96–106)
CO2: 24 mmol/L (ref 18–29)
Calcium: 9.1 mg/dL (ref 8.7–10.2)
Creatinine, Ser: 0.63 mg/dL (ref 0.57–1.00)
GFR calc Af Amer: 130 mL/min/{1.73_m2} (ref >59)
GFR calc non Af Amer: 113 mL/min/{1.73_m2} (ref >59)
GLOBULIN, TOTAL: 2.9 g/dL (ref 1.5–4.5)
Glucose: 100 mg/dL — ABNORMAL HIGH (ref 65–99)
POTASSIUM: 4.4 mmol/L (ref 3.5–5.2)
SODIUM: 139 mmol/L (ref 134–144)
Total Protein: 6.8 g/dL (ref 6.0–8.5)

## 2015-11-04 LAB — VITAMIN B12: Vitamin B-12: 466 pg/mL (ref 211–946)

## 2015-11-04 LAB — LIPID PANEL W/O CHOL/HDL RATIO
Cholesterol, Total: 160 mg/dL (ref 100–199)
HDL: 44 mg/dL (ref >39)
LDL Calculated: 100 mg/dL — ABNORMAL HIGH (ref 0–99)
TRIGLYCERIDES: 82 mg/dL (ref 0–149)
VLDL Cholesterol Cal: 16 mg/dL (ref 5–40)

## 2015-11-04 LAB — VITAMIN D 25 HYDROXY (VIT D DEFICIENCY, FRACTURES): Vit D, 25-Hydroxy: 42.4 ng/mL (ref 30.0–100.0)

## 2015-11-04 LAB — TSH: TSH: 1.36 u[IU]/mL (ref 0.450–4.500)

## 2015-11-06 ENCOUNTER — Ambulatory Visit (INDEPENDENT_AMBULATORY_CARE_PROVIDER_SITE_OTHER): Payer: 59 | Admitting: Family Medicine

## 2015-11-06 ENCOUNTER — Encounter: Payer: Self-pay | Admitting: Family Medicine

## 2015-11-06 VITALS — BP 122/74 | HR 66 | Temp 98.4°F | Ht 65.5 in | Wt 207.5 lb

## 2015-11-06 DIAGNOSIS — Z1239 Encounter for other screening for malignant neoplasm of breast: Secondary | ICD-10-CM

## 2015-11-06 DIAGNOSIS — F341 Dysthymic disorder: Secondary | ICD-10-CM

## 2015-11-06 DIAGNOSIS — E669 Obesity, unspecified: Secondary | ICD-10-CM

## 2015-11-06 DIAGNOSIS — Z0000 Encounter for general adult medical examination without abnormal findings: Secondary | ICD-10-CM

## 2015-11-06 DIAGNOSIS — G4733 Obstructive sleep apnea (adult) (pediatric): Secondary | ICD-10-CM | POA: Insufficient documentation

## 2015-11-06 DIAGNOSIS — Z01419 Encounter for gynecological examination (general) (routine) without abnormal findings: Secondary | ICD-10-CM | POA: Insufficient documentation

## 2015-11-06 NOTE — Assessment & Plan Note (Signed)
Reviewed preventive care protocols, scheduled due services, and updated immunizations Discussed nutrition, exercise, diet, and healthy lifestyle.  Mammogram ordered- pt to schedule. 

## 2015-11-06 NOTE — Patient Instructions (Signed)
Great to see you. Please call [facility] to schedule your mammogram.

## 2015-11-06 NOTE — Progress Notes (Signed)
Pre visit review using our clinic review tool, if applicable. No additional management support is needed unless otherwise documented below in the visit note. 

## 2015-11-06 NOTE — Assessment & Plan Note (Signed)
Well controlled on current rxs. No changes made. 

## 2015-11-06 NOTE — Assessment & Plan Note (Signed)
Improving with diet and exercise. Congratulated her on her success and encouraged her to continue.

## 2015-11-06 NOTE — Progress Notes (Signed)
Subjective:   Patient ID: Shelby Rios, female    DOB: 10/16/1975, 40 y.o.   MRN: [id number]  Shelby Rios is a pleasant 40 y.o. year old female who presents to clinic today with Annual Exam and follow up of chronic medical conditions on 11/06/2015  HPI:  Remote h/o hysterectomy  Referred her to pulmonary last month for excessive day time sleepiness to rule out OSA. Sleep study on 10/10/15 (reviewed) confirmed OSA.  She has been wearing a CPAP for 2 weeks.  Does feel a little better.  Also losing weight with weight watchers and walking 7 miles per day.  Wt Readings from Last 3 Encounters:  11/06/15 207 lb 8 oz (94.121 kg)  09/28/15 217 lb (98.431 kg)  09/19/15 215 lb 12 oz (97.864 kg)     Anxiety- currently taking Zoloft 50 mg daily with as needed Xanax.  Lab Results  Component Value Date   CHOL 171 10/28/2014   HDL 47 10/28/2014   LDLCALC 114 10/28/2014   TRIG 50 10/28/2014   Lab Results  Component Value Date   CREATININE 0.62 08/07/2015   Lab Results  Component Value Date   TSH 1.980 08/07/2015   Lab Results  Component Value Date   WBC 9.4 08/07/2015   HGB 12.4 10/28/2014   HCT 37.9 08/07/2015   MCV 87 08/07/2015   PLT 317 08/07/2015   Lab Results  Component Value Date   NA 139 08/07/2015   K 4.0 08/07/2015   CL 99 08/07/2015   CO2 24 08/07/2015     Current Outpatient Prescriptions on File Prior to Visit  Medication Sig Dispense Refill  . ALPRAZolam (XANAX) 0.25 MG tablet Take 1 tablet (0.25 mg total) by mouth 2 (two) times daily as needed for anxiety or sleep. 60 tablet 0  . ibuprofen (ADVIL,MOTRIN) 200 MG tablet Take 200 mg by mouth every 6 (six) hours as needed. UAD     . sertraline (ZOLOFT) 50 MG tablet Take 1 tablet (50 mg total) by mouth daily. 90 tablet 1   No current facility-administered medications on file prior to visit.    No Known Allergies  Past Medical History  Diagnosis Date  . Palpitations   . Fatigue   . Dyspepsia   . Back  pain   . Depression   . Anxiety   . Breast pain, right   . Edema   . Abnormal mammogram, unspecified   . Nonspecific abnormal results of other specified function study   . Routine general medical examination at a health care facility   . Abnormal weight gain     Past Surgical History  Procedure Laterality Date  . Partial hysterectomy  2008    endometriosis  . Tonsillectomy    . Adenoidectomy    . Wisdom tooth extraction    . Dilation and curettage of uterus    . Brca test      neg    Family History  Problem Relation Age of Onset  . Ovarian cancer Mother   . Stroke Mother 64  . Cancer Mother     ovarian  . Hypokalemia Sister     deceased at 52; was getting regular IV K infusions; long Hx of pseudotumor cerebri  . Heart attack      GF - 35  . Coronary artery disease      family hx  . Hypertension      family hx  . Diabetes      family hx  .  Heart disease Father     CABG age 18    Social History   Social History  . Marital Status: Married    Spouse Name: N/A  . Number of Children: 2  . 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 Smoking status: Never Smoker   . Smokeless tobacco: Never Used     Comment: nonsmoker  . Alcohol Use: No  . Drug Use: No  . Sexual Activity: Not on file   Other Topics Concern  . Not on file   Social History Narrative   Divorced since 9/08; 2 children   3 coffees/day    The PMH, PSH, Social History, Family History, Medications, and allergies have been reviewed in [facility], and have been updated if relevant.   Review of Systems  Constitutional: Negative.   HENT: Negative.   Respiratory: Negative.   Cardiovascular: Negative.   Gastrointestinal: Negative.   Endocrine: Negative.   Genitourinary: Negative.   Musculoskeletal: Negative.   Skin: Negative.   Allergic/Immunologic: Negative.   Neurological: Negative.   Hematological: Negative.     Psychiatric/Behavioral: Negative.   All other systems reviewed and are negative.      Objective:    BP 122/74 mmHg  Pulse 66  Temp(Src) 98.4 F (36.9 C) (Oral)  Ht 5' 5.5" (1.664 m)  Wt 207 lb 8 oz (94.121 kg)  BMI 33.99 kg/m2  SpO2 98%   Physical Exam   General:  Well-developed,well-nourished,in no acute distress; alert,appropriate and cooperative throughout examination Head:  normocephalic and atraumatic.   Eyes:  vision grossly intact, pupils equal, pupils round, and pupils reactive to light.   Ears:  R ear normal and L ear normal.   Nose:  no external deformity.   Mouth:  good dentition.   Neck:  No deformities, masses, or tenderness noted. Breasts:  No mass, nodules, thickening, tenderness, bulging, retraction, inflamation, nipple discharge or skin changes noted.   Lungs:  Normal respiratory effort, chest expands symmetrically. Lungs are clear to auscultation, no crackles or wheezes. Heart:  Normal rate and regular rhythm. S1 and S2 normal without gallop, murmur, click, rub or other extra sounds. Abdomen:  Bowel sounds positive,abdomen soft and non-tender without masses, organomegaly or hernias noted. Rectal:  no external abnormalities.   Genitalia:  Pelvic Exam:        External: normal female genitalia without lesions or masses        Vagina: normal without lesions or masses Msk:  No deformity or scoliosis noted of thoracic or lumbar spine.   Extremities:  No clubbing, cyanosis, edema, or deformity noted with normal full range of motion of all joints.   Neurologic:  alert & oriented X3 and gait normal.   Skin:  Intact without suspicious lesions or rashes Cervical Nodes:  No lymphadenopathy noted Axillary Nodes:  No palpable lymphadenopathy Psych:  Cognition and judgment appear intact. Alert and cooperative with normal attention span and concentration. No apparent delusions, illusions, hallucinations       Assessment & Plan:   Well woman exam  Obesity (BMI  30-39.9)  DEPRESSION/ANXIETY  OSA (obstructive sleep apnea) No Follow-up on file.

## 2015-11-06 NOTE — Assessment & Plan Note (Signed)
Continue CPAP.  

## 2015-11-09 ENCOUNTER — Telehealth: Payer: Self-pay | Admitting: Family Medicine

## 2015-11-09 NOTE — Telephone Encounter (Signed)
Results are in your inbox

## 2015-11-09 NOTE — Telephone Encounter (Signed)
Terri, I do not see these results. 

## 2015-11-09 NOTE — Telephone Encounter (Signed)
Pt called checking on her lab results she had done last Friday [facility] were [facility] to [facility]

## 2015-11-11 ENCOUNTER — Other Ambulatory Visit: Payer: Self-pay | Admitting: Family Medicine

## 2015-11-13 NOTE — Telephone Encounter (Signed)
Labs reviewed and look excellent.

## 2015-11-13 NOTE — Telephone Encounter (Signed)
Lm on pts vm and advised per Dr Aron. Pt instructed to contact office should she have additional questions 

## 2015-11-21 ENCOUNTER — Ambulatory Visit
Admission: RE | Admit: 2015-11-21 | Discharge: 2015-11-21 | Disposition: A | Discharge status: Home / Self Care | Payer: 59 | Source: Ambulatory Visit | Attending: Family Medicine | Admitting: Family Medicine

## 2015-11-21 ENCOUNTER — Other Ambulatory Visit: Payer: Self-pay | Admitting: Family Medicine

## 2015-11-21 DIAGNOSIS — Z1239 Encounter for other screening for malignant neoplasm of breast: Secondary | ICD-10-CM | POA: Insufficient documentation

## 2015-11-21 DIAGNOSIS — Z1231 Encounter for screening mammogram for malignant neoplasm of breast: Secondary | ICD-10-CM | POA: Insufficient documentation

## 2015-12-11 ENCOUNTER — Institutional Professional Consult (permissible substitution): Payer: 59 | Admitting: Pulmonary Disease

## 2016-01-01 ENCOUNTER — Encounter: Payer: Self-pay | Admitting: Internal Medicine

## 2016-01-02 NOTE — Progress Notes (Signed)
  [facility]      Assessment and Plan:  Obstructive sleep apnea.  --Symptoms and signs of obstructive sleep apnea, with short term memory loss.  --Sleep study on 10/10/15; showed ahi of 17. Started on PAP and doing well.   Sinus tachycardia --History of tachycardia seen at night on holter monitor several years ago.   Date: 01/02/2016  MRN# [id number] Shelby Rios 01/20/1976  Referring Physician: Dr. Jovita Gammaalia.   Shelby Rios is a 40 y.o. old female seen in consultation for chief complaint of:    Chief Complaint  Patient presents with  . Follow-up    pt states she wears CPAP avg 5-7hr nightly, feels pressure is okay. pt app does show slight leaking. DME:[facility]    HPI:   The patient is a 40 yo female with daytime sleepiness symptoms, snoring, family history of OSA. She was sent for a sleep study which was positive for OSA and was started on PAP.   She is doing well with PAP, she is using it every night and is noting that she is more awake during the day.  Review of her download data today shows that her 95th percentile pressure was 6.4, residual AHI is very low at 0.6. Uses is excellent at 97%, the patient uses CPAP at average of 6 hours and 32 minutes per night. Medication:   Reviewed.     Allergies:  Review of patient's allergies indicates no known allergies.  Review of Systems: Gen:  Denies  fever, sweats, chills HEENT: Denies blurred vision, double vision. bleeds,  Cvc:  No dizziness, chest pain. Resp:   Denies cough or sputum production, shortness of breath Other:  All other systems were reviewed with the patient and were negative other that what is mentioned in the HPI.   Physical Examination:   VS: BP 128/72 (BP Location: Left Arm, Cuff Size: Normal)   Pulse 62   Ht 5\' 6"  (1.676 m)   Wt 204 lb (92.5 kg)   SpO2 100%   BMI 32.93 kg/m   General Appearance: No distress  Neuro:without focal findings,  speech normal,  HEENT: PERRLA, EOM  intact.  Malimpatti 3.  Pulmonary: normal breath sounds, No wheezing.  CardiovascularNormal S1,S2.  No m/r/g.   Abdomen: Benign, Soft, non-tender. Renal:  No costovertebral tenderness  GU:  No performed at this time. Endoc: No evident thyromegaly, no signs of acromegaly. Skin:   warm, no rashes, no ecchymosis  Extremities: normal, no cyanosis, clubbing.  Other findings:    LABORATORY PANEL:   CBC No results for input(s): WBC, HGB, HCT, PLT in the last 168 hours. ------------------------------------------------------------------------------------------------------------------  Chemistries  No results for input(s): NA, K, CL, CO2, GLUCOSE, BUN, CREATININE, CALCIUM, MG, AST, ALT, ALKPHOS, BILITOT in the last 168 hours.  Invalid input(s): GFRCGP ------------------------------------------------------------------------------------------------------------------  Cardiac Enzymes No results for input(s): TROPONINI in the last 168 hours. ------------------------------------------------------------  RADIOLOGY:  No results found.     Thank  you for the consultation and for allowing [facility] to assist in the care of your patient. Our recommendations are noted above.  Please contact us if we can be of further service.   Wells Guileseep Kishaun Erekson, MD.  Board Certified in Internal Medicine, Pulmonary Medicine, Critical Care Medicine, and Sleep Medicine.  [facility] Office Number: [phone]  Santiago Gladavid Kasa, M.D.  Stephanie AcreVishal Mungal, M.D.  Billy Fischeravid Simonds, M.D  01/02/2016

## 2016-01-03 ENCOUNTER — Ambulatory Visit (INDEPENDENT_AMBULATORY_CARE_PROVIDER_SITE_OTHER): Payer: 59 | Admitting: Internal Medicine

## 2016-01-03 ENCOUNTER — Encounter: Payer: Self-pay | Admitting: Internal Medicine

## 2016-01-03 VITALS — BP 128/72 | HR 62 | Ht 66.0 in | Wt 204.0 lb

## 2016-01-03 DIAGNOSIS — G4733 Obstructive sleep apnea (adult) (pediatric): Secondary | ICD-10-CM

## 2016-01-03 NOTE — Patient Instructions (Addendum)
Will change range on Auto-PAP to 5-12.   --Look out for symptoms of recurring snoring or increasing daytime sleepiness.   --Continue PAP every night.

## 2016-01-04 ENCOUNTER — Ambulatory Visit (INDEPENDENT_AMBULATORY_CARE_PROVIDER_SITE_OTHER): Payer: 59 | Admitting: *Deleted

## 2016-01-04 ENCOUNTER — Ambulatory Visit: Payer: 59 | Admitting: Family Medicine

## 2016-01-04 DIAGNOSIS — Z23 Encounter for immunization: Secondary | ICD-10-CM

## 2016-01-24 ENCOUNTER — Encounter: Payer: Self-pay | Admitting: Nurse Practitioner

## 2016-01-24 ENCOUNTER — Ambulatory Visit (INDEPENDENT_AMBULATORY_CARE_PROVIDER_SITE_OTHER): Payer: 59 | Admitting: Family Medicine

## 2016-01-24 ENCOUNTER — Encounter: Payer: Self-pay | Admitting: Family Medicine

## 2016-01-24 DIAGNOSIS — R1013 Epigastric pain: Secondary | ICD-10-CM | POA: Diagnosis not present

## 2016-01-24 NOTE — Progress Notes (Signed)
Subjective:   Patient ID: Shelby Rios, female    DOB: 1975-09-20, 40 y.o.   MRN: [id number]  Shelby Rios is a pleasant 40 y.o. year old female who presents to clinic today with Abdominal Cramping  on 01/24/2016  HPI:  2 weeks of "gnawing" epigastric/ RUQ pain.  Worse after eating.  Feels a little like pain she had when she was diagnosed with H pylori in 07/2015.  She was fully treated for that infection.  Has had some nausea, no vomiting.  No diarrhea.  NO constipation.  No blood in her stool.  No fever.  Has tried prilosec without much improvement of symptoms.   Current Outpatient Prescriptions on File Prior to Visit  Medication Sig Dispense Refill  . ALPRAZolam (XANAX) 0.25 MG tablet Take 1 tablet (0.25 mg total) by mouth 2 (two) times daily as needed for anxiety or sleep. 60 tablet 0  . Cholecalciferol (VITAMIN D3) 5000 units TABS Take 1 tablet by mouth daily.    Marland Kitchen ibuprofen (ADVIL,MOTRIN) 200 MG tablet Take 200 mg by mouth every 6 (six) hours as needed. UAD     . sertraline (ZOLOFT) 50 MG tablet Take 1 tablet by mouth  daily 90 tablet 3   No current facility-administered medications on file prior to visit.     No Known Allergies  Past Medical History:  Diagnosis Date  . Abnormal mammogram, unspecified   . Abnormal weight gain   . Anxiety   . Back pain   . Breast pain, right   . Depression   . Dyspepsia   . Edema   . Fatigue   . Nonspecific abnormal results of other specified function study   . Palpitations   . Routine general medical examination at a health care facility     Past Surgical History:  Procedure Laterality Date  . adenoidectomy    . BRCA test     neg  . DILATION AND CURETTAGE OF UTERUS    . PARTIAL HYSTERECTOMY  2008   endometriosis  . TONSILLECTOMY    . WISDOM TOOTH EXTRACTION      Family History  Problem Relation Age of Onset  . Hypokalemia Sister     deceased at 62; was getting regular IV K infusions; long Hx of pseudotumor cerebri    . Heart disease Father     CABG age 43  . Ovarian cancer Mother   . Stroke Mother 68  . Cancer Mother     ovarian  . Heart attack      GF - 35  . Coronary artery disease      family hx  . Hypertension      family hx  . Diabetes      family hx  . Breast cancer Maternal Grandmother     Social History   Social History  . Marital status: Married    Spouse name: N/A  . Number of children: 2  . 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 Smoking status: Never Smoker  . Smokeless tobacco: Never Used     Comment: nonsmoker  . Alcohol use No  . Drug use: No  . Sexual activity: Not on file   Other Topics Concern  . Not on file   Social History Narrative   Divorced since 9/08; 2 children   3 coffees/day    The PMH, PSH, Social History, Family History, Medications, and  allergies have been reviewed in [facility], and have been updated if relevant.   Review of Systems  Constitutional: Negative.   HENT: Negative.   Gastrointestinal: Positive for abdominal distention, abdominal pain and nausea. Negative for anal bleeding, blood in stool, constipation, diarrhea, rectal pain and vomiting.  Genitourinary: Negative.   Neurological: Negative.   All other systems reviewed and are negative.      Objective:    BP 118/64   Pulse 73   Temp 98.2 F (36.8 C) (Oral)   Wt 206 lb 8 oz (93.7 kg)   SpO2 98%   BMI 33.33 kg/m    Physical Exam  Constitutional: She is oriented to person, place, and time. She appears well-developed and well-nourished. No distress.  HENT:  Head: Normocephalic.  Eyes: Conjunctivae are normal.  Cardiovascular: Normal rate.   Pulmonary/Chest: Effort normal.  Abdominal: Soft. Bowel sounds are normal. She exhibits no distension and no mass. There is tenderness. There is no rebound and no guarding.  Musculoskeletal: Normal range of motion.  Neurological: She is alert and oriented to person,  place, and time. No cranial nerve deficit.  Skin: Skin is warm and dry. She is not diaphoretic.  Psychiatric: She has a normal mood and affect. Her behavior is normal. Judgment and thought content normal.  Nursing note and vitals reviewed.         Assessment & Plan:   Abdominal pain, epigastric - Plan: H. pylori antibody, IgG, US Abdomen Limited RUQ, Lipase, Ambulatory referral to Gastroenterology No Follow-up on file.

## 2016-01-24 NOTE — Progress Notes (Signed)
Pre visit review using our clinic review tool, if applicable. No additional management support is needed unless otherwise documented below in the visit note. 

## 2016-01-24 NOTE — Patient Instructions (Signed)
Great to see you. Please stop by to see Shelby Rios on your way out.  We will call you with your lab results.

## 2016-01-24 NOTE — Assessment & Plan Note (Signed)
Deteriorated. Recheck labs- h pylori, lipase, RUQ to rule out biliary colic/stones. Referral to GI for endoscopy given recurrence of symptoms. Samples of Align given to pt as well. The patient indicates understanding of these issues and agrees with the plan.

## 2016-01-25 ENCOUNTER — Other Ambulatory Visit: Payer: Self-pay | Admitting: Family Medicine

## 2016-01-25 ENCOUNTER — Ambulatory Visit
Admission: RE | Admit: 2016-01-25 | Discharge: 2016-01-25 | Disposition: A | Discharge status: Home / Self Care | Payer: 59 | Source: Ambulatory Visit | Attending: Family Medicine | Admitting: Family Medicine

## 2016-01-25 DIAGNOSIS — R1013 Epigastric pain: Secondary | ICD-10-CM | POA: Insufficient documentation

## 2016-01-25 LAB — LIPASE: Lipase: 28 U/L (ref 14–72)

## 2016-01-25 LAB — H. PYLORI ANTIBODY, IGG: H Pylori IgG: 1.2 U/mL — ABNORMAL HIGH (ref 0.0–0.8)

## 2016-01-25 MED ORDER — AMOXICILLIN 500 MG PO CAPS: 1000.0000 mg | ORAL_CAPSULE | Freq: Two times a day (BID) | ORAL | 0 refills | Status: DC

## 2016-01-25 MED ORDER — CLARITHROMYCIN 500 MG PO TABS: 500.0000 mg | ORAL_TABLET | Freq: Two times a day (BID) | ORAL | 0 refills | Status: DC

## 2016-01-25 MED ORDER — OMEPRAZOLE 20 MG PO CPDR: 20.0000 mg | DELAYED_RELEASE_CAPSULE | Freq: Two times a day (BID) | ORAL | 3 refills | Status: DC

## 2016-02-02 ENCOUNTER — Ambulatory Visit (INDEPENDENT_AMBULATORY_CARE_PROVIDER_SITE_OTHER): Payer: 59 | Admitting: Nurse Practitioner

## 2016-02-02 ENCOUNTER — Encounter: Payer: Self-pay | Admitting: Nurse Practitioner

## 2016-02-02 VITALS — BP 118/78 | HR 68 | Ht 66.0 in | Wt 205.4 lb

## 2016-02-02 DIAGNOSIS — R110 Nausea: Secondary | ICD-10-CM | POA: Diagnosis not present

## 2016-02-02 DIAGNOSIS — R1013 Epigastric pain: Secondary | ICD-10-CM | POA: Diagnosis not present

## 2016-02-02 NOTE — Patient Instructions (Signed)
You have been scheduled for an endoscopy. Please follow written instructions given to you at your visit today. If you use inhalers (even only as needed), please bring them with you on the day of your procedure.   

## 2016-02-02 NOTE — Progress Notes (Signed)
HPI: Patient is a 40 year old female referred by PCP, Dr. Arnette Norris, for evaluation of abdominal pain. Patient saw PCP in April with fatigue, no GI sx but h.pylori checked and positive. She was treated with antibiotics and follow up stool study apparently negative. Patient did fine until two weeks ago when she developed nonradiating upper abdominal pain described as constant hunger type pain but actually worse with food. Even water exacerbated the pain. She had associated nausea, bloating. Tried Prilosec but not much help. Occasional NSAID use. Patient went back to PCP, RUQ ordered and normal. Another h.pylori study obtained and positive. Patient back on antibiotics. Pain better but not resolved. No ETOH intake. Hysterectomy 2009.    Past Medical History:  Diagnosis Date  . Abnormal mammogram, unspecified   . Abnormal weight gain   . Anxiety   . Back pain   . Breast pain, right   . Depression   . Dyspepsia   . Nonspecific abnormal results of other specified function study   . Palpitations   . Routine general medical examination at a health care facility     Past Surgical History:  Procedure Laterality Date  . adenoidectomy    . BRCA test     neg  . DILATION AND CURETTAGE OF UTERUS    . PARTIAL HYSTERECTOMY  2008   endometriosis  . TONSILLECTOMY    . WISDOM TOOTH EXTRACTION     Family History  Problem Relation Age of Onset  . Hypokalemia Sister     deceased at 51; was getting regular IV K infusions; long Hx of pseudotumor cerebri  . Heart disease Father     CABG age 35  . Ovarian cancer Mother   . Stroke Mother 75  . Cancer Mother     ovarian  . Heart attack      GF - 35  . Coronary artery disease      family hx  . Hypertension      family hx  . Diabetes      family hx  . Breast cancer Maternal Grandmother    Social History  Substance Use Topics  . Smoking status: Never Smoker  . Smokeless tobacco: Never Used     Comment: nonsmoker  . Alcohol use No    Current Outpatient Prescriptions  Medication Sig Dispense Refill  . ALPRAZolam (XANAX) 0.25 MG tablet Take 1 tablet (0.25 mg total) by mouth 2 (two) times daily as needed for anxiety or sleep. 60 tablet 0  . amoxicillin (AMOXIL) 500 MG capsule Take 2 capsules (1,000 mg total) by mouth 2 (two) times daily. 56 capsule 0  . Cholecalciferol (VITAMIN D3) 5000 units TABS Take 1 tablet by mouth daily.    . clarithromycin (BIAXIN) 500 MG tablet Take 1 tablet (500 mg total) by mouth 2 (two) times daily. 28 tablet 0  . ibuprofen (ADVIL,MOTRIN) 200 MG tablet Take 200 mg by mouth every 6 (six) hours as needed. UAD     . omeprazole (PRILOSEC) 20 MG capsule Take 1 capsule (20 mg total) by mouth 2 (two) times daily before a meal. 28 capsule 3  . Probiotic Product (ALIGN PO) Take by mouth. Once a day    . sertraline (ZOLOFT) 50 MG tablet Take 1 tablet by mouth  daily 90 tablet 3   No current facility-administered medications for this visit.    No Known Allergies   Review of Systems: Positive for anxiety, fatigue, headaches, heart rhythm changes, shortness of breath  and sleeping problems, All other systems reviewed and negative except where noted in HPI.    US Abdomen Limited Ruq  Result Date: 01/25/2016 CLINICAL DATA:  Epigastric pain. EXAM: US ABDOMEN LIMITED - RIGHT UPPER QUADRANT COMPARISON:  CT 11/14/2006. FINDINGS: Gallbladder: No gallstones or wall thickening visualized. No sonographic Murphy sign noted by sonographer. Common bile duct: Diameter: 2 mm Liver: No focal lesion identified. Within normal limits in parenchymal echogenicity. IMPRESSION: Normal exam. Electronically Signed   By: Marcello Moores  Register   On: 01/25/2016 11:50    Physical Exam: BP 118/78   Pulse 68   Ht '5\' 6"'$  (1.676 m)   Wt 205 lb 6.4 oz (93.2 kg)   SpO2 98%   BMI 33.15 kg/m  Constitutional: Pleasant,well-developed, white female in no acute distress. HEENT: Normocephalic and atraumatic. Conjunctivae are normal. No scleral  icterus. Neck supple.  Cardiovascular: Normal rate, regular rhythm.  Pulmonary/chest: Effort normal and breath sounds normal. No wheezing, rales or rhonchi. Abdominal: Soft, nondistended, nontender. Bowel sounds active throughout. There are no masses palpable. No hepatomegaly. Extremities: no edema Lymphadenopathy: No cervical adenopathy noted. Neurological: Alert and oriented to person place and time. Skin: Skin is warm and dry. No rashes noted. Psychiatric: Normal mood and affect. Behavior is normal.   ASSESSMENT AND PLAN: 3. 40 year old female referred for epigastric pain, nausea. No improvement with PPI. For further evaluation patient will be scheduled for EGD. The risks and benefits of the procedure were discusses and she agrees to proceed.   2. OSA, on CPAP at night.   3. Hx of h.pylori, treated in April with follow up negative stool ag. Recheck of h.pylori IgG this month was positive which is expected. Patient already on second round of  H.Pylori treatment which she will complete but follow up needs to be only in form of stool ag as her IgG will remain positive for a long time, if not indefinitely.  Lucille Passy, MD

## 2016-02-05 ENCOUNTER — Encounter: Payer: Self-pay | Admitting: *Deleted

## 2016-02-09 NOTE — Progress Notes (Signed)
Thank you for sending this case to me. I have reviewed the entire note, and the outlined plan seems appropriate.  Yes, h pylori seems likely to be unrelated. Nonspecific symptoms - EGD is appropriate for further workup.

## 2016-02-19 ENCOUNTER — Encounter: Payer: Self-pay | Admitting: Gastroenterology

## 2016-02-28 ENCOUNTER — Encounter: Payer: 59 | Admitting: Gastroenterology

## 2016-03-01 ENCOUNTER — Telehealth: Payer: Self-pay | Admitting: Internal Medicine

## 2016-03-01 NOTE — Telephone Encounter (Signed)
Pt states [facility] has not received last encounter with Dr. Ardyth Manam on 9/13. They need this to bill insurance for CPAP machine

## 2016-03-01 NOTE — Telephone Encounter (Signed)
[facility] who states that they received everything that is needed on 02-28-16. Pt aware & voiced understanding.  Nothing further needed.

## 2016-03-04 ENCOUNTER — Ambulatory Visit (AMBULATORY_SURGERY_CENTER): Payer: 59 | Admitting: Gastroenterology

## 2016-03-04 ENCOUNTER — Encounter: Payer: Self-pay | Admitting: Gastroenterology

## 2016-03-04 VITALS — BP 123/78 | HR 65 | Temp 98.4°F | Resp 21 | Ht 66.0 in | Wt 205.0 lb

## 2016-03-04 DIAGNOSIS — R1013 Epigastric pain: Secondary | ICD-10-CM

## 2016-03-04 MED ORDER — SODIUM CHLORIDE 0.9 % IV SOLN: 500.0000 mL | INTRAVENOUS | Status: DC

## 2016-03-04 NOTE — Patient Instructions (Signed)
YOU HAD AN ENDOSCOPIC PROCEDURE TODAY AT THE [facility]:   Refer to the procedure report that was given to you for any specific questions about what was found during the examination.  If the procedure report does not answer your questions, please call your gastroenterologist to clarify.  If you requested that your care partner not be given the details of your procedure findings, then the procedure report has been included in a sealed envelope for you to review at your convenience later.  YOU SHOULD EXPECT: Some feelings of bloating in the abdomen. Passage of more gas than usual.  Walking can help get rid of the air that was put into your GI tract during the procedure and reduce the bloating.  Please Note:  You might notice some irritation and congestion in your nose or some drainage.  This is from the oxygen used during your procedure.  There is no need for concern and it should clear up in a day or so.  SYMPTOMS TO REPORT IMMEDIATELY:  Following upper endoscopy (EGD)  Vomiting of blood or coffee ground material  New chest pain or pain under the shoulder blades  Painful or persistently difficult swallowing  New shortness of breath  Fever of 100F or higher  Black, tarry-looking stools  For urgent or emergent issues, a gastroenterologist can be reached at any hour by calling [phone].   DIET:  We do recommend a small meal at first, but then you may proceed to your regular diet.  Drink plenty of fluids but you should avoid alcoholic beverages for 24 hours.  ACTIVITY:  You should plan to take it easy for the rest of today and you should NOT DRIVE or use heavy machinery until tomorrow (because of the sedation medicines used during the test).    FOLLOW UP: Our staff will call the number listed on your records the next business day following your procedure to check on you and address any questions or concerns that you may have regarding the information given to you following  your procedure. If we do not reach you, we will leave a message.  However, if you are feeling well and you are not experiencing any problems, there is no need to return our call.  We will assume that you have returned to your regular daily activities without incident.  SIGNATURES/CONFIDENTIALITY: You and/or your care partner have signed paperwork which will be entered into your electronic medical record.  These signatures attest to the fact that that the information above on your After Visit Summary has been reviewed and is understood.  Full responsibility of the confidentiality of this discharge information lies with you and/or your care-partner.  Continue your normal medications  May use over the counter Miralax to relieve constipation  Resume normal diet

## 2016-03-04 NOTE — Op Note (Signed)
[facility] Patient Name: Shelby RoutLaura Hora Procedure Date: 03/04/2016 12:38 PM MRN: [id number] Endoscopist: Sherilyn CooterHenry L. Myrtie Neitheranis , MD Age: 40 Referring MD:  Date of Birth: 12/17/1975 Gender: Female Account #: [id number] Procedure:                Upper GI endoscopy Indications:              Epigastric abdominal pain Medicines:                Monitored Anesthesia Care Procedure:                Pre-Anesthesia Assessment:                           - Prior to the procedure, a History and Physical                            was performed, and patient medications and                            allergies were reviewed. The patient's tolerance of                            previous anesthesia was also reviewed. The risks                            and benefits of the procedure and the sedation                            options and risks were discussed with the patient.                            All questions were answered, and informed consent                            was obtained. Prior Anticoagulants: The patient has                            taken no previous anticoagulant or antiplatelet                            agents. ASA Grade Assessment: II - A patient with                            mild systemic disease. After reviewing the risks                            and benefits, the patient was deemed in                            satisfactory condition to undergo the procedure.                           After obtaining informed consent, the endoscope was  passed under direct vision. Throughout the                            procedure, the patient's blood pressure, pulse, and                            oxygen saturations were monitored continuously. The                            Model GIF-[id number]) scope was introduced                            through the mouth, and advanced to the second part                            of duodenum. The upper GI  endoscopy was                            accomplished without difficulty. The patient                            tolerated the procedure well. Scope In: Scope Out: Findings:                 The larynx was normal.                           The esophagus was normal.                           The stomach was normal.                           The cardia and gastric fundus were normal on                            retroflexion.                           The examined duodenum was normal. Complications:            No immediate complications. Estimated Blood Loss:     Estimated blood loss: none. Impression:               - Normal larynx.                           - Normal esophagus.                           - Normal stomach.                           - Normal examined duodenum.                           - No specimens collected.  Recent RUQ ultrasound study normal, pain not                            consistently post-prandial, and lasts from several                            to 30 minutes. Recommendation:           - Patient has a contact number available for                            emergencies. The signs and symptoms of potential                            delayed complications were discussed with the                            patient. Return to normal activities tomorrow.                            Written discharge instructions were provided to the                            patient.                           - Resume previous diet.                           - Continue present medications.                           - OTC ducosate and as-needed miralax to relieve                            constipation.                           If pain persists despite relief of constipation,                            consider a trial of anti-spasmodic medication. Henry L. Myrtie Neitheranis, MD 03/04/2016 1:28:00 PM This report has been signed electronically.

## 2016-03-05 ENCOUNTER — Telehealth: Payer: Self-pay

## 2016-03-05 NOTE — Telephone Encounter (Signed)
  Follow up Call-  Call back number 03/04/2016  Post procedure Call Back phone  # #[phone] cell  Permission to leave phone message Yes  Some recent data might be hidden     Patient questions:  Do you have a fever, pain , or abdominal swelling? No. Pain Score  0 *  Have you tolerated food without any problems? Yes.    Have you been able to return to your normal activities? Yes.    Do you have any questions about your discharge instructions: Diet   Yes.   Medications  No. Follow up visit  No.  Do you have questions or concerns about your Care? No.  Actions: * If pain score is 4 or above: No action needed, pain <4.

## 2016-04-17 ENCOUNTER — Telehealth: Payer: Self-pay | Admitting: Family Medicine

## 2016-04-17 NOTE — Telephone Encounter (Signed)
Please call to check on pt. 

## 2016-04-17 NOTE — Telephone Encounter (Signed)
TELEPHONE ADVICE RECORD [facility]  Patient Name: [name]  DOB: 10/05/1975    Initial Comment Caller states she has had a headache for a couple of days. Her side of her face is starting to feel like pins and needles. If she touches the right side it's painful.   Nurse Assessment  Nurse: Odis LusterBowers, RN, Bjorn Loserhonda Date/Time Lamount Cohen(Eastern Time): 04/17/2016 10:14:55 AM  Confirm and document reason for call. If symptomatic, describe symptoms. ---Caller states she has had a headache for a couple of days. Her side of her face is starting to feel like pins and needles. If she touches the right side it's painful. Denies drooping of the face, or rash. Denies fever. Rates HA 5/10. Feels like neck ache on the right side also.  Does the patient have any new or worsening symptoms? ---Yes  Will a triage be completed? ---Yes  Related visit to physician within the last 2 weeks? ---No  Does the PT have any chronic conditions? (i.e. diabetes, asthma, etc.) ---Yes  List chronic conditions. ---anxiety  Is the patient pregnant or possibly pregnant? (Ask all females between the ages of 112-55) ---No  Is this a behavioral health or substance abuse call? ---No     Guidelines    Guideline Title Affirmed Question Affirmed Notes  Neurologic Deficit Headache (and neurologic deficit)    Final Disposition User   Go to ED Now Odis LusterBowers, RN, Rhonda    Ref[facility]   Disagree/Comply: Comply

## 2016-04-17 NOTE — Telephone Encounter (Signed)
Lm on pts vm requesting a call back 

## 2016-06-18 ENCOUNTER — Telehealth: Payer: Self-pay | Admitting: *Deleted

## 2016-06-18 ENCOUNTER — Encounter: Payer: Self-pay | Admitting: *Deleted

## 2016-06-18 NOTE — Telephone Encounter (Signed)
Let PT know. Dr. Dayton MartesAron would like to evaluate before having labs drawn.

## 2016-06-18 NOTE — Telephone Encounter (Signed)
PT sent in the following message via MyChart. I have scheduled her for an appointment on March 5 at 8:00 am. Please advise if she needs labs prior.   Appointment Request From: Roosevelt LocksLaura T. Manson PasseyBrown    With Provider: Ruthe Mannanalia Aron, MD [facility]    Preferred Date Range: From 06/24/2016 To 07/20/2016    Preferred Times: Any    Reason for visit: Office Visit    Comments:  Please schedule an appointment for me with Dr. Dayton MartesAron. Early morning, late day or lunch time is preferred if available. Recently I have been having night sweats, flushing and headaches. I have had a partial hysterectomy and am concerned that I may be having pre-menopausal symptoms. If labwork is needed in advance, I am a [employer] employee, and generally pick up a script for testing.     Please contact me at [phone] or [phone] with questions.    Thanks     Nelly RoutLaura Wymer

## 2016-06-18 NOTE — Telephone Encounter (Signed)
Noted. I'd like to see her before checking labs.  Thank you.

## 2016-06-24 ENCOUNTER — Ambulatory Visit (INDEPENDENT_AMBULATORY_CARE_PROVIDER_SITE_OTHER): Payer: 59 | Admitting: Family Medicine

## 2016-06-24 ENCOUNTER — Encounter: Payer: Self-pay | Admitting: Family Medicine

## 2016-06-24 DIAGNOSIS — R61 Generalized hyperhidrosis: Secondary | ICD-10-CM | POA: Diagnosis not present

## 2016-06-24 NOTE — Progress Notes (Signed)
Pre visit review using our clinic review tool, if applicable. No additional management support is needed unless otherwise documented below in the visit note. 

## 2016-06-24 NOTE — Progress Notes (Signed)
Subjective:   Patient ID: Shelby Rios, female    DOB: [birth year], 41 y.o.   MRN: [id number]  Shelby Rios is a pleasant 41 y.o. year old female who presents to clinic today with Night Sweats (Still has her ovaries); Flushing; and Headache  on 06/24/2016  HPI:  For past month or two, noticed increased frequency of night sweats, flushing and headaches.  She questions if perhaps she is peri menopausal. Remote h/o hysterectomy over 10 years ago for endometriosis. She does still have her ovaries.  Also noticing increased headaches- not associated with nausea, vomiting or sensitivity to light.  She has lost 4 family members within recent months so she attributed these symptoms to stress initially.  Wt Readings from Last 3 Encounters:  06/24/16 209 lb (94.8 kg)  03/04/16 205 lb (93 kg)  02/02/16 205 lb 6.4 oz (93.2 kg)    Current Outpatient Prescriptions on File Prior to Visit  Medication Sig Dispense Refill  . ALPRAZolam (XANAX) 0.25 MG tablet Take 1 tablet (0.25 mg total) by mouth 2 (two) times daily as needed for anxiety or sleep. 60 tablet 0  . Cholecalciferol (VITAMIN D3) 5000 units TABS Take 1 tablet by mouth daily.    Marland Kitchen ibuprofen (ADVIL,MOTRIN) 200 MG tablet Take 200 mg by mouth every 6 (six) hours as needed. UAD     . omeprazole (PRILOSEC) 20 MG capsule Take 1 capsule (20 mg total) by mouth 2 (two) times daily before a meal. 28 capsule 3  . Probiotic Product (ALIGN PO) Take by mouth. Once a day    . sertraline (ZOLOFT) 50 MG tablet Take 1 tablet by mouth  daily 90 tablet 3   Current Facility-Administered Medications on File Prior to Visit  Medication Dose Route Frequency Provider Last Rate Last Dose  . 0.9 %  sodium chloride infusion  500 mL Intravenous Continuous Nelida Meuse III, MD        No Known Allergies  Past Medical History:  Diagnosis Date  . Abnormal mammogram, unspecified   . Abnormal weight gain   . Allergy   . Anemia   . Anxiety   . Back pain   . Breast  pain, right   . Depression   . Dyspepsia   . Edema   . Fatigue   . GERD (gastroesophageal reflux disease)   . Hypertension    only during pregnancy  . Nonspecific abnormal results of other specified function study   . Palpitations   . Routine general medical examination at a health care facility   . Seizures ([place])    couple seisure around age 56- tx with dilantin for a period then d/c  . Sleep apnea    c-pap wears    Past Surgical History:  Procedure Laterality Date  . adenoidectomy    . BRCA test     neg  . DILATION AND CURETTAGE OF UTERUS    . PARTIAL HYSTERECTOMY  2008   endometriosis  . TONSILLECTOMY    . WISDOM TOOTH EXTRACTION      Family History  Problem Relation Age of Onset  . Hypokalemia Sister     deceased at 26; was getting regular IV K infusions; long Hx of pseudotumor cerebri  . Heart disease Father     CABG age 64  . Ovarian cancer Mother   . Stroke Mother 36  . Cancer Mother     ovarian  . Heart attack      GF - 35  .  Coronary artery disease      family hx  . Hypertension      family hx  . Diabetes      family hx  . Breast cancer Maternal Grandmother   . Colon cancer Neg Hx   . Esophageal cancer Neg Hx   . Pancreatic cancer Neg Hx   . Rectal cancer Neg Hx   . Stomach cancer Neg Hx     Social History   Social History  . Marital status: Married    Spouse name: N/A  . Number of children: 2  . 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 Smoking status: Never Smoker  . Smokeless tobacco: Never Used     Comment: nonsmoker  . Alcohol use No  . Drug use: No  . Sexual activity: Not on file   Other Topics Concern  . Not on file   Social History Narrative   Divorced since 9/08; 2 children   3 coffees/day    The PMH, PSH, Social History, Family History, Medications, and allergies have been reviewed in [facility], and have been updated if relevant.  Review of Systems    Endocrine: Positive for heat intolerance.       + night sweats  Neurological: Positive for headaches. Negative for dizziness, tremors, seizures, syncope, facial asymmetry, speech difficulty, weakness, light-headedness and numbness.  Psychiatric/Behavioral: Negative for decreased concentration, dysphoric mood and sleep disturbance.       Objective:    BP 112/84 (BP Location: Right Arm, Patient Position: Sitting, Cuff Size: Normal)   Pulse 65   Temp 98.3 F (36.8 C) (Oral)   Wt 209 lb (94.8 kg)   SpO2 98%   BMI 33.73 kg/m    Physical Exam  Constitutional: She is oriented to person, place, and time. She appears well-developed and well-nourished. No distress.  HENT:  Head: Normocephalic and atraumatic.  Eyes: Conjunctivae are normal.  Cardiovascular: Normal rate.   Pulmonary/Chest: Effort normal.  Musculoskeletal: Normal range of motion. She exhibits no edema.  Neurological: She is alert and oriented to person, place, and time. No cranial nerve deficit.  Skin: Skin is warm and dry. She is not diaphoretic.  Psychiatric: She has a normal mood and affect. Her behavior is normal. Judgment and thought content normal.  Nursing note and vitals reviewed.         Assessment & Plan:   Night sweats - Plan: Luteinizing hormone, Follicle Stimulating Hormone, CBC with Differential/Platelet, TSH, Comprehensive metabolic panel, Hemoglobin A1c No Follow-up on file.

## 2016-06-24 NOTE — Assessment & Plan Note (Signed)
New- associated with increased incidence of headaches and daytime hot flashes. >25 minutes spent in face to face time with patient, >50% spent in counselling or coordination of care Likely multifactorial.  Increased stressors. Will check labs today to rule out other causes, including peri menopause. The patient indicates understanding of these issues and agrees with the plan. Orders Placed This Encounter  Procedures  . Luteinizing hormone  . Follicle Stimulating Hormone  . CBC with Differential/Platelet  . TSH  . Comprehensive metabolic panel  . Hemoglobin A1c

## 2016-06-24 NOTE — Patient Instructions (Signed)
Great to see you. I will call you with your lab results.   

## 2016-06-25 LAB — CBC WITH DIFFERENTIAL/PLATELET
BASOS: 0 %
Basophils Absolute: 0 10*3/uL (ref 0.0–0.2)
EOS (ABSOLUTE): 0.2 10*3/uL (ref 0.0–0.4)
Eos: 3 %
HEMOGLOBIN: 12 g/dL (ref 11.1–15.9)
Hematocrit: 36.8 % (ref 34.0–46.6)
IMMATURE GRANS (ABS): 0 10*3/uL (ref 0.0–0.1)
Immature Granulocytes: 0 %
LYMPHS ABS: 1.7 10*3/uL (ref 0.7–3.1)
LYMPHS: 26 %
MCH: 27.9 pg (ref 26.6–33.0)
MCHC: 32.6 g/dL (ref 31.5–35.7)
MCV: 86 fL (ref 79–97)
MONOCYTES: 13 %
Monocytes Absolute: 0.8 10*3/uL (ref 0.1–0.9)
NEUTROS ABS: 3.9 10*3/uL (ref 1.4–7.0)
Neutrophils: 58 %
Platelets: 317 10*3/uL (ref 150–379)
RBC: 4.3 x10E6/uL (ref 3.77–5.28)
RDW: 14.2 % (ref 12.3–15.4)
WBC: 6.6 10*3/uL (ref 3.4–10.8)

## 2016-06-25 LAB — COMPREHENSIVE METABOLIC PANEL
ALBUMIN: 4 g/dL (ref 3.5–5.5)
ALT: 17 IU/L (ref 0–32)
AST: 14 IU/L (ref 0–40)
Albumin/Globulin Ratio: 1.3 (ref 1.2–2.2)
Alkaline Phosphatase: 78 IU/L (ref 39–117)
BUN / CREAT RATIO: 12 (ref 9–23)
BUN: 9 mg/dL (ref 6–24)
Bilirubin Total: 0.3 mg/dL (ref 0.0–1.2)
CO2: 24 mmol/L (ref 18–29)
CREATININE: 0.74 mg/dL (ref 0.57–1.00)
Calcium: 8.9 mg/dL (ref 8.7–10.2)
Chloride: 102 mmol/L (ref 96–106)
GFR, EST AFRICAN AMERICAN: 117 mL/min/{1.73_m2} (ref >59)
GFR, EST NON AFRICAN AMERICAN: 102 mL/min/{1.73_m2} (ref >59)
Globulin, Total: 3 g/dL (ref 1.5–4.5)
Glucose: 96 mg/dL (ref 65–99)
Potassium: 4 mmol/L (ref 3.5–5.2)
Sodium: 142 mmol/L (ref 134–144)
TOTAL PROTEIN: 7 g/dL (ref 6.0–8.5)

## 2016-06-25 LAB — HEMOGLOBIN A1C
Est. average glucose Bld gHb Est-mCnc: 97 mg/dL
Hgb A1c MFr Bld: 5 % (ref 4.8–5.6)

## 2016-06-25 LAB — LUTEINIZING HORMONE: LH: 7 m[IU]/mL

## 2016-06-25 LAB — FOLLICLE STIMULATING HORMONE: FSH: 3.3 m[IU]/mL

## 2016-06-25 LAB — TSH: TSH: 1.31 u[IU]/mL (ref 0.450–4.500)

## 2016-07-17 ENCOUNTER — Encounter: Payer: Self-pay | Admitting: Family Medicine

## 2016-07-17 ENCOUNTER — Ambulatory Visit (INDEPENDENT_AMBULATORY_CARE_PROVIDER_SITE_OTHER): Payer: 59 | Admitting: Family Medicine

## 2016-07-17 VITALS — BP 122/82 | HR 78 | Temp 98.6°F | Ht 65.0 in | Wt 211.0 lb

## 2016-07-17 DIAGNOSIS — H6981 Other specified disorders of Eustachian tube, right ear: Secondary | ICD-10-CM | POA: Diagnosis not present

## 2016-07-17 NOTE — Progress Notes (Signed)
SUBJECTIVE:  Shelby Rios is a 41 y.o. female who complains of right ear pain, fullness for 14 days. She denies a history of anorexia, chest pain, chills, dizziness and fevers and denies a history of asthma. Patient denies smoke cigarettes.   Current Outpatient Prescriptions on File Prior to Visit  Medication Sig Dispense Refill  . ALPRAZolam (XANAX) 0.25 MG tablet Take 1 tablet (0.25 mg total) by mouth 2 (two) times daily as needed for anxiety or sleep. 60 tablet 0  . Cholecalciferol (VITAMIN D3) 5000 units TABS Take 1 tablet by mouth daily.    Marland Kitchen ibuprofen (ADVIL,MOTRIN) 200 MG tablet Take 200 mg by mouth every 6 (six) hours as needed. UAD     . omeprazole (PRILOSEC) 20 MG capsule Take 1 capsule (20 mg total) by mouth 2 (two) times daily before a meal. 28 capsule 3  . Probiotic Product (ALIGN PO) Take by mouth. Once a day    . sertraline (ZOLOFT) 50 MG tablet Take 1 tablet by mouth  daily 90 tablet 3   Current Facility-Administered Medications on File Prior to Visit  Medication Dose Route Frequency Provider Last Rate Last Dose  . 0.9 %  sodium chloride infusion  500 mL Intravenous Continuous Nelida Meuse III, MD        No Known Allergies  Past Medical History:  Diagnosis Date  . Abnormal mammogram, unspecified   . Abnormal weight gain   . Allergy   . Anemia   . Anxiety   . Back pain   . Breast pain, right   . Depression   . Dyspepsia   . Edema   . Fatigue   . GERD (gastroesophageal reflux disease)   . Hypertension    only during pregnancy  . Nonspecific abnormal results of other specified function study   . Palpitations   . Routine general medical examination at a health care facility   . Seizures ([place])    couple seisure around age 53- tx with dilantin for a period then d/c  . Sleep apnea    c-pap wears    Past Surgical History:  Procedure Laterality Date  . adenoidectomy    . BRCA test     neg  . DILATION AND CURETTAGE OF UTERUS    . PARTIAL HYSTERECTOMY  2008   endometriosis  . TONSILLECTOMY    . WISDOM TOOTH EXTRACTION      Family History  Problem Relation Age of Onset  . Hypokalemia Sister     deceased at 52; was getting regular IV K infusions; long Hx of pseudotumor cerebri  . Heart disease Father     CABG age 4  . Ovarian cancer Mother   . Stroke Mother 68  . Cancer Mother     ovarian  . Heart attack      GF - 35  . Coronary artery disease      family hx  . Hypertension      family hx  . Diabetes      family hx  . Breast cancer Maternal Grandmother   . Colon cancer Neg Hx   . Esophageal cancer Neg Hx   . Pancreatic cancer Neg Hx   . Rectal cancer Neg Hx   . Stomach cancer Neg Hx     Social History   Social History  . Marital status: Married    Spouse name: N/A  . Number of children: 2  . 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 Smoking status: Never Smoker  . Smokeless tobacco: Never Used     Comment: nonsmoker  . Alcohol use No  . Drug use: No  . Sexual activity: Not on file   Other Topics Concern  . Not on file   Social History Narrative   Divorced since 9/08; 2 children   3 coffees/day    The PMH, PSH, Social History, Family History, Medications, and allergies have been reviewed in [facility], and have been updated if relevant.  OBJECTIVE: BP 122/82   Pulse 78   Temp 98.6 F (37 C)   Ht _0  (1.651 m)   Wt 211 lb (95.7 kg)   SpO2 98%   BMI 35.11 kg/m   She appears well, vital signs are as noted. Clear fluid behind right TM  Throat and pharynx normal.  Neck supple. No adenopathy in the neck. Nose is congested. Sinuses non tender. The chest is clear, without wheezes or rales.  ASSESSMENT:  ETD  PLAN: Symptomatic therapy suggested: behind the counter decongestant with sudafed. Lack of antibiotic effectiveness discussed with her. Call or return to clinic prn if these symptoms worsen or fail to improve as anticipated.

## 2016-07-17 NOTE — Patient Instructions (Signed)
Great to see you.  Try OTC decongestant (sudafed, zyrtec D, allegra D).  Behind the counter.  Keep me updated.

## 2016-09-16 ENCOUNTER — Encounter: Payer: Self-pay | Admitting: Family Medicine

## 2016-10-02 ENCOUNTER — Ambulatory Visit (INDEPENDENT_AMBULATORY_CARE_PROVIDER_SITE_OTHER): Payer: 59 | Admitting: Family Medicine

## 2016-10-02 DIAGNOSIS — Z90710 Acquired absence of both cervix and uterus: Secondary | ICD-10-CM | POA: Insufficient documentation

## 2016-10-02 DIAGNOSIS — N939 Abnormal uterine and vaginal bleeding, unspecified: Secondary | ICD-10-CM | POA: Diagnosis not present

## 2016-10-02 NOTE — Assessment & Plan Note (Signed)
Agree this is unusual s/p hysterectomy. ? Vaginal tear/irritation but she did not notice pain at the time. Will get pelvic/transvaginal US for further evaluation. GYN referral after reviewing US. The patient indicates understanding of these issues and agrees with the plan.

## 2016-10-02 NOTE — Patient Instructions (Signed)
Great to see you. Please stop by to see Shelby Rios on your way out.   

## 2016-10-02 NOTE — Progress Notes (Signed)
Subjective:   Patient ID: Shelby Rios, female    DOB: 1975-04-29, 41 y.o.   MRN: [id number]  Shelby Rios is a pleasant 41 y.o. year old female who presents to clinic today with Vaginal Bleeding  on 10/02/2016  HPI:  Remote h/o hysterectomy (approx 10 years ago) for menorrhagia and endometriosis.  Last month, she noticed some vaginal bleeding.  Bled for two days "like a real period." Did not have vaginal pain at that time.  Has noticed some abdominal bloating and some discomfort with intercourse but does not remember discomfort with the bleeding.  Since her hysterectomy, this is the first time she has experienced any vaginal bleeding. Current Outpatient Prescriptions on File Prior to Visit  Medication Sig Dispense Refill  . sertraline (ZOLOFT) 50 MG tablet Take 1 tablet by mouth  daily 90 tablet 3  . ALPRAZolam (XANAX) 0.25 MG tablet Take 1 tablet (0.25 mg total) by mouth 2 (two) times daily as needed for anxiety or sleep. (Patient not taking: Reported on 10/02/2016) 60 tablet 0  . Cholecalciferol (VITAMIN D3) 5000 units TABS Take 1 tablet by mouth daily.    Marland Kitchen ibuprofen (ADVIL,MOTRIN) 200 MG tablet Take 200 mg by mouth every 6 (six) hours as needed. UAD     . omeprazole (PRILOSEC) 20 MG capsule Take 1 capsule (20 mg total) by mouth 2 (two) times daily before a meal. (Patient not taking: Reported on 10/02/2016) 28 capsule 3  . Probiotic Product (ALIGN PO) Take by mouth. Once a day     Current Facility-Administered Medications on File Prior to Visit  Medication Dose Route Frequency Provider Last Rate Last Dose  . 0.9 %  sodium chloride infusion  500 mL Intravenous Continuous Danis, Kirke Corin, MD        No Known Allergies  Past Medical History:  Diagnosis Date  . Abnormal mammogram, unspecified   . Abnormal weight gain   . Allergy   . Anemia   . Anxiety   . Back pain   . Breast pain, right   . Depression   . Dyspepsia   . Edema   . Fatigue   . GERD (gastroesophageal  reflux disease)   . Hypertension    only during pregnancy  . Nonspecific abnormal results of other specified function study   . Palpitations   . Routine general medical examination at a health care facility   . Seizures ([place])    couple seisure around age 65- tx with dilantin for a period then d/c  . Sleep apnea    c-pap wears    Past Surgical History:  Procedure Laterality Date  . adenoidectomy    . BRCA test     neg  . DILATION AND CURETTAGE OF UTERUS    . PARTIAL HYSTERECTOMY  2008   endometriosis  . TONSILLECTOMY    . WISDOM TOOTH EXTRACTION      Family History  Problem Relation Age of Onset  . Hypokalemia Sister        deceased at 72; was getting regular IV K infusions; long Hx of pseudotumor cerebri  . Heart disease Father        CABG age 84  . Ovarian cancer Mother   . Stroke Mother 20  . Cancer Mother        ovarian  . Heart attack Unknown        GF - 35  . Coronary artery disease Unknown        family  hx  . Hypertension Unknown        family hx  . Diabetes Unknown        family hx  . Breast cancer Maternal Grandmother   . Colon cancer Neg Hx   . Esophageal cancer Neg Hx   . Pancreatic cancer Neg Hx   . Rectal cancer Neg Hx   . Stomach cancer Neg Hx     Social History   Social History  . Marital status: Married    Spouse name: N/A  . Number of children: 2  . 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 Smoking status: Never Smoker  . Smokeless tobacco: Never Used     Comment: nonsmoker  . Alcohol use No  . Drug use: No  . Sexual activity: Not on file   Other Topics Concern  . Not on file   Social History Narrative   Divorced since 9/08; 2 children   3 coffees/day    The PMH, PSH, Social History, Family History, Medications, and allergies have been reviewed in [facility], and have been updated if relevant.   Review of Systems  Gastrointestinal: Positive for abdominal  distention. Negative for abdominal pain.  Endocrine: Negative.   Genitourinary: Positive for vaginal bleeding and vaginal pain. Negative for vaginal discharge.  Neurological: Negative.   Psychiatric/Behavioral: Negative.   All other systems reviewed and are negative.      Objective:    BP 98/70   Pulse 68   Wt 210 lb (95.3 kg)   SpO2 98%   BMI 34.95 kg/m    Physical Exam  Constitutional: She is oriented to person, place, and time. She appears well-developed and well-nourished. No distress.  HENT:  Head: Normocephalic and atraumatic.  Eyes: Conjunctivae are normal.  Cardiovascular: Normal rate.   Pulmonary/Chest: Effort normal.  Genitourinary: Vagina normal. There is no rash, tenderness or lesion on the right labia. There is no rash, tenderness or lesion on the left labia.  Neurological: She is alert and oriented to person, place, and time. No cranial nerve deficit.  Skin: Skin is warm and dry. She is not diaphoretic.  Psychiatric: She has a normal mood and affect. Her behavior is normal. Judgment and thought content normal.  Nursing note and vitals reviewed.         Assessment & Plan:   Vaginal bleeding - Plan: US Pelvis Complete, US Transvaginal Non-OB  History of hysterectomy - Plan: US Pelvis Complete, US Transvaginal Non-OB No Follow-up on file.

## 2016-10-04 ENCOUNTER — Ambulatory Visit (HOSPITAL_COMMUNITY)
Admission: EM | Admit: 2016-10-04 | Discharge: 2016-10-04 | Disposition: A | Discharge status: Home / Self Care | Payer: 59 | Attending: Internal Medicine | Admitting: Internal Medicine

## 2016-10-04 ENCOUNTER — Encounter (HOSPITAL_COMMUNITY): Payer: Self-pay | Admitting: Family Medicine

## 2016-10-04 DIAGNOSIS — R110 Nausea: Secondary | ICD-10-CM

## 2016-10-04 DIAGNOSIS — K529 Noninfective gastroenteritis and colitis, unspecified: Secondary | ICD-10-CM | POA: Diagnosis not present

## 2016-10-04 MED ORDER — ONDANSETRON 4 MG PO TBDP
4.0000 mg | ORAL_TABLET | Freq: Once | ORAL | Status: AC
  Administered 2016-10-04: 4 mg via ORAL

## 2016-10-04 MED ORDER — ONDANSETRON 4 MG PO TBDP
ORAL_TABLET | ORAL | Status: AC
  Filled 2016-10-04: qty 1

## 2016-10-04 MED ORDER — ACETAMINOPHEN 325 MG PO TABS
650.0000 mg | ORAL_TABLET | Freq: Once | ORAL | Status: AC
  Administered 2016-10-04: 650 mg via ORAL

## 2016-10-04 MED ORDER — ACETAMINOPHEN 325 MG PO TABS
ORAL_TABLET | ORAL | Status: AC
  Filled 2016-10-04: qty 2

## 2016-10-04 MED ORDER — ONDANSETRON 4 MG PO TBDP: 4.0000 mg | ORAL_TABLET | Freq: Three times a day (TID) | ORAL | 0 refills | Status: DC | PRN

## 2016-10-04 NOTE — Discharge Instructions (Signed)
Go to Emergency Room.

## 2016-10-04 NOTE — ED Triage Notes (Signed)
Pt here for abd cramping, N,V,D. sts that eating makes it worse. sts that the pain in centralized in the naval area. sts hx of hysterectomy.

## 2016-10-04 NOTE — ED Provider Notes (Signed)
CSN: [id number]     Arrival date & time 10/04/16  1846 History   None    Chief Complaint  Patient presents with  . Abdominal Pain  . Emesis  . Diarrhea   (Consider location/radiation/quality/duration/timing/severity/associated sxs/prior Treatment) Patient c/o NVD for 2 days   The history is provided by the patient.  Abdominal Pain  Pain location:  Generalized Pain severity:  Mild Timing:  Constant Chronicity:  New Relieved by:  Nothing Associated symptoms: diarrhea and vomiting   Emesis  Associated symptoms: abdominal pain and diarrhea   Diarrhea  Associated symptoms: abdominal pain and vomiting     Past Medical History:  Diagnosis Date  . Abnormal mammogram, unspecified   . Abnormal weight gain   . Allergy   . Anemia   . Anxiety   . Back pain   . Breast pain, right   . Depression   . Dyspepsia   . Edema   . Fatigue   . GERD (gastroesophageal reflux disease)   . Hypertension    only during pregnancy  . Nonspecific abnormal results of other specified function study   . Palpitations   . Routine general medical examination at a health care facility   . Seizures ([place])    couple seisure around age 51- tx with dilantin for a period then d/c  . Sleep apnea    c-pap wears   Past Surgical History:  Procedure Laterality Date  . adenoidectomy    . BRCA test     neg  . DILATION AND CURETTAGE OF UTERUS    . PARTIAL HYSTERECTOMY  2008   endometriosis  . TONSILLECTOMY    . WISDOM TOOTH EXTRACTION     Family History  Problem Relation Age of Onset  . Hypokalemia Sister        deceased at 60; was getting regular IV K infusions; long Hx of pseudotumor cerebri  . Heart disease Father        CABG age 54  . Ovarian cancer Mother   . Stroke Mother 19  . Cancer Mother        ovarian  . Heart attack Unknown        GF - 35  . Coronary artery disease Unknown        family hx  . Hypertension Unknown        family hx  . Diabetes Unknown        family hx  . Breast  cancer Maternal Grandmother   . Colon cancer Neg Hx   . Esophageal cancer Neg Hx   . Pancreatic cancer Neg Hx   . Rectal cancer Neg Hx   . Stomach cancer Neg Hx    Social History  Substance Use Topics  . Smoking status: Never Smoker  . Smokeless tobacco: Never Used     Comment: nonsmoker  . Alcohol use No   OB History    No data available     Review of Systems  Constitutional: Negative.   HENT: Negative.   Eyes: Negative.   Respiratory: Negative.   Cardiovascular: Negative.   Gastrointestinal: Positive for abdominal pain, diarrhea and vomiting.  Endocrine: Negative.   Genitourinary: Negative.   Musculoskeletal: Negative.   Allergic/Immunologic: Negative.   Neurological: Negative.   Hematological: Negative.   Psychiatric/Behavioral: Negative.     Allergies  Patient has no known allergies.  Home Medications   Prior to Admission medications   Medication Sig Start Date End Date Taking? Authorizing Provider  ALPRAZolam Duanne Moron)  0.25 MG tablet Take 1 tablet (0.25 mg total) by mouth 2 (two) times daily as needed for anxiety or sleep. Patient not taking: Reported on 10/02/2016 04/11/15   Lucille Passy, MD  Cholecalciferol (VITAMIN D3) 5000 units TABS Take 1 tablet by mouth daily.    [provider]  ibuprofen (ADVIL,MOTRIN) 200 MG tablet Take 200 mg by mouth every 6 (six) hours as needed. UAD     [provider]  omeprazole (PRILOSEC) 20 MG capsule Take 1 capsule (20 mg total) by mouth 2 (two) times daily before a meal. Patient not taking: Reported on 10/02/2016 01/25/16   Lucille Passy, MD  ondansetron (ZOFRAN ODT) 4 MG disintegrating tablet Take 1 tablet (4 mg total) by mouth every 8 (eight) hours as needed for nausea or vomiting. 10/04/16   Lysbeth Penner, FNP  Probiotic Product (ALIGN PO) Take by mouth. Once a day    [provider]  sertraline (ZOLOFT) 50 MG tablet Take 1 tablet by mouth  daily 11/13/15   Lucille Passy, MD   Meds Ordered and  Administered this Visit   Medications  acetaminophen (TYLENOL) tablet 650 mg (not administered)  ondansetron (ZOFRAN-ODT) disintegrating tablet 4 mg (not administered)    BP 110/90   Pulse 98   Temp 98.7 F (37.1 C)   Resp 18   SpO2 98%  No data found.   Physical Exam  Constitutional: She appears well-developed and well-nourished.  HENT:  Head: Normocephalic and atraumatic.  Eyes: Conjunctivae and EOM are normal. Pupils are equal, round, and reactive to light.  Neck: Normal range of motion. Neck supple.  Cardiovascular: Normal rate, regular rhythm and normal heart sounds.   Pulmonary/Chest: Effort normal and breath sounds normal.  Abdominal: Soft. Bowel sounds are normal.  Nursing note and vitals reviewed.   Urgent Care Course     Procedures (including critical care time)  Labs Review Labs Reviewed - No data to display  Imaging Review No results found.   Visual Acuity Review  Right Eye Distance:   Left Eye Distance:   Bilateral Distance:    Right Eye Near:   Left Eye Near:    Bilateral Near:         MDM   1. Gastroenteritis   2. Nausea    zofran odt 79m one po now Tylenol 6223mone po now  Zofran ODT 62m48mne po tid prn #21  Push po fluids, rest, tylenol and motrin otc prn as directed for fever, arthralgias, and myalgias.  Follow up prn if sx's continue or persist.    OxfLysbeth PennerNP 10/04/16 1934

## 2016-10-07 ENCOUNTER — Encounter: Payer: Self-pay | Admitting: Family Medicine

## 2016-10-07 ENCOUNTER — Ambulatory Visit
Admission: RE | Admit: 2016-10-07 | Discharge: 2016-10-07 | Disposition: A | Discharge status: Home / Self Care | Payer: 59 | Source: Ambulatory Visit | Attending: Family Medicine | Admitting: Family Medicine

## 2016-10-07 DIAGNOSIS — N939 Abnormal uterine and vaginal bleeding, unspecified: Secondary | ICD-10-CM | POA: Diagnosis present

## 2016-10-07 DIAGNOSIS — Z90710 Acquired absence of both cervix and uterus: Secondary | ICD-10-CM | POA: Insufficient documentation

## 2016-10-07 DIAGNOSIS — N83202 Unspecified ovarian cyst, left side: Secondary | ICD-10-CM | POA: Insufficient documentation

## 2016-10-15 ENCOUNTER — Other Ambulatory Visit: Payer: Self-pay | Admitting: Family Medicine

## 2016-10-16 NOTE — Telephone Encounter (Signed)
Last refill 11/13/15 Last OV 10/02/16 Ok to refill?

## 2016-11-06 ENCOUNTER — Ambulatory Visit (INDEPENDENT_AMBULATORY_CARE_PROVIDER_SITE_OTHER): Payer: 59 | Admitting: Family Medicine

## 2016-11-06 ENCOUNTER — Encounter: Payer: Self-pay | Admitting: Family Medicine

## 2016-11-06 VITALS — BP 112/80 | HR 67 | Ht 66.25 in | Wt 212.0 lb

## 2016-11-06 DIAGNOSIS — F341 Dysthymic disorder: Secondary | ICD-10-CM | POA: Diagnosis not present

## 2016-11-06 DIAGNOSIS — Z0000 Encounter for general adult medical examination without abnormal findings: Secondary | ICD-10-CM | POA: Insufficient documentation

## 2016-11-06 DIAGNOSIS — E559 Vitamin D deficiency, unspecified: Secondary | ICD-10-CM

## 2016-11-06 DIAGNOSIS — Z01419 Encounter for gynecological examination (general) (routine) without abnormal findings: Secondary | ICD-10-CM

## 2016-11-06 DIAGNOSIS — E538 Deficiency of other specified B group vitamins: Secondary | ICD-10-CM

## 2016-11-06 NOTE — Assessment & Plan Note (Signed)
Well controlled on current dose of zoloft. No changes made to rxs. 

## 2016-11-06 NOTE — Progress Notes (Signed)
Pre visit review using our clinic review tool, if applicable. No additional management support is needed unless otherwise documented below in the visit note. 

## 2016-11-06 NOTE — Progress Notes (Signed)
Subjective:   Patient ID: Shelby Rios, female    DOB: Jun 12, 1975, 41 y.o.   MRN: [id number]  [name] is a pleasant 41 y.o. year old female who presents to clinic today with Annual Exam and follow up of chronic medical conditions on 11/06/2016  HPI:  Remote h/o hysterectomy Mammogram 11/22/15   Wt Readings from Last 3 Encounters:  11/06/16 212 lb (96.2 kg)  10/02/16 210 lb (95.3 kg)  07/17/16 211 lb (95.7 kg)     Anxiety- currently taking Zoloft 50 mg daily with as needed Xanax.  She feels her anxiety is under much better control.Still grieving a lot of recent losses but denies feeling depressed.  Lab Results  Component Value Date   CHOL 160 11/03/2015   HDL 44 11/03/2015   LDLCALC 100 (H) 11/03/2015   TRIG 82 11/03/2015   Lab Results  Component Value Date   CREATININE 0.74 06/24/2016   Lab Results  Component Value Date   TSH 1.310 06/24/2016   Lab Results  Component Value Date   WBC 6.6 06/24/2016   HGB 12.0 06/24/2016   HCT 36.8 06/24/2016   MCV 86 06/24/2016   PLT 317 06/24/2016   Lab Results  Component Value Date   NA 142 06/24/2016   K 4.0 06/24/2016   CL 102 06/24/2016   CO2 24 06/24/2016     Current Outpatient Prescriptions on File Prior to Visit  Medication Sig Dispense Refill  . ALPRAZolam (XANAX) 0.25 MG tablet Take 1 tablet (0.25 mg total) by mouth 2 (two) times daily as needed for anxiety or sleep. 60 tablet 0  . Cholecalciferol (VITAMIN D3) 5000 units TABS Take 1 tablet by mouth daily.    Marland Kitchen ibuprofen (ADVIL,MOTRIN) 200 MG tablet Take 200 mg by mouth every 6 (six) hours as needed. UAD     . omeprazole (PRILOSEC) 20 MG capsule Take 1 capsule (20 mg total) by mouth 2 (two) times daily before a meal. 28 capsule 3  . ondansetron (ZOFRAN ODT) 4 MG disintegrating tablet Take 1 tablet (4 mg total) by mouth every 8 (eight) hours as needed for nausea or vomiting. 20 tablet 0  . Probiotic Product (ALIGN PO) Take by mouth. Once a day    .  sertraline (ZOLOFT) 50 MG tablet TAKE 1 TABLET BY MOUTH  DAILY 90 tablet 1   Current Facility-Administered Medications on File Prior to Visit  Medication Dose Route Frequency Provider Last Rate Last Dose  . 0.9 %  sodium chloride infusion  500 mL Intravenous Continuous Danis, Kirke Corin, MD        No Known Allergies  Past Medical History:  Diagnosis Date  . Abnormal mammogram, unspecified   . Abnormal weight gain   . Allergy   . Anemia   . Anxiety   . Back pain   . Breast pain, right   . Depression   . Dyspepsia   . Edema   . Fatigue   . GERD (gastroesophageal reflux disease)   . Hypertension    only during pregnancy  . Nonspecific abnormal results of other specified function study   . Palpitations   . Routine general medical examination at a health care facility   . Seizures ([place])    couple seisure around age 63- tx with dilantin for a period then d/c  . Sleep apnea    c-pap wears    Past Surgical History:  Procedure Laterality Date  . adenoidectomy    . BRCA  test     neg  . DILATION AND CURETTAGE OF UTERUS    . PARTIAL HYSTERECTOMY  2008   endometriosis  . TONSILLECTOMY    . WISDOM TOOTH EXTRACTION      Family History  Problem Relation Age of Onset  . Hypokalemia Sister        deceased at 71; was getting regular IV K infusions; long Hx of pseudotumor cerebri  . Heart disease Father        CABG age 27  . Ovarian cancer Mother   . Stroke Mother 56  . Cancer Mother        ovarian  . Heart attack Unknown        GF - 35  . Coronary artery disease Unknown        family hx  . Hypertension Unknown        family hx  . Diabetes Unknown        family hx  . Breast cancer Maternal Grandmother   . Colon cancer Neg Hx   . Esophageal cancer Neg Hx   . Pancreatic cancer Neg Hx   . Rectal cancer Neg Hx   . Stomach cancer Neg Hx     Social History   Social History  . Marital status: Married    Spouse name: N/A  . Number of children: 2  . 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 Smoking status: Never Smoker  . Smokeless tobacco: Never Used     Comment: nonsmoker  . Alcohol use No  . Drug use: No  . Sexual activity: Not on file   Other Topics Concern  . Not on file   Social History Narrative   Divorced since 9/08; 2 children   3 coffees/day    The PMH, PSH, Social History, Family History, Medications, and allergies have been reviewed in [facility], and have been updated if relevant.   Review of Systems  Constitutional: Negative.   HENT: Negative.   Respiratory: Negative.   Cardiovascular: Negative.   Gastrointestinal: Negative.   Endocrine: Negative.   Genitourinary: Negative.   Musculoskeletal: Negative.   Skin: Negative.   Allergic/Immunologic: Negative.   Neurological: Negative.   Hematological: Negative.   Psychiatric/Behavioral: Negative.   All other systems reviewed and are negative.      Objective:    BP 112/80   Pulse 67   Ht 5' 6.25" (1.683 m)   Wt 212 lb (96.2 kg)   SpO2 97%   BMI 33.96 kg/m    Physical Exam   General:  Well-developed,well-nourished,in no acute distress; alert,appropriate and cooperative throughout examination Head:  normocephalic and atraumatic.   Eyes:  vision grossly intact, pupils equal, pupils round, and pupils reactive to light.   Ears:  R ear normal and L ear normal.   Nose:  no external deformity.   Mouth:  good dentition.   Neck:  No deformities, masses, or tenderness noted. Breasts:  No mass, nodules, thickening, tenderness, bulging, retraction, inflamation, nipple discharge or skin changes noted.   Lungs:  Normal respiratory effort, chest expands symmetrically. Lungs are clear to auscultation, no crackles or wheezes. Heart:  Normal rate and regular rhythm. S1 and S2 normal without gallop, murmur, click, rub or other extra sounds. Abdomen:  Bowel sounds positive,abdomen soft and non-tender without  masses, organomegaly or hernias noted. Msk:  No deformity or scoliosis noted of thoracic or lumbar spine.  Extremities:  No clubbing, cyanosis, edema, or deformity noted with normal full range of motion of all joints.   Neurologic:  alert & oriented X3 and gait normal.   Skin:  Intact without suspicious lesions or rashes Cervical Nodes:  No lymphadenopathy noted Axillary Nodes:  No palpable lymphadenopathy Psych:  Cognition and judgment appear intact. Alert and cooperative with normal attention span and concentration. No apparent delusions, illusions, hallucinations       Assessment & Plan:   Well woman exam No Follow-up on file.

## 2016-11-06 NOTE — Assessment & Plan Note (Signed)
Reviewed preventive care protocols, scheduled due services, and updated immunizations Discussed nutrition, exercise, diet, and healthy lifestyle.  Orders Placed This Encounter  Procedures  . CBC with Differential/Platelet  . Comprehensive metabolic panel  . Lipid panel  . TSH  . Vitamin B12  . Vitamin D, 25-hydroxy    

## 2016-11-07 LAB — LIPID PANEL
CHOL/HDL RATIO: 3.4 ratio (ref 0.0–4.4)
Cholesterol, Total: 174 mg/dL (ref 100–199)
HDL: 51 mg/dL (ref >39)
LDL Calculated: 110 mg/dL — ABNORMAL HIGH (ref 0–99)
Triglycerides: 67 mg/dL (ref 0–149)
VLDL CHOLESTEROL CAL: 13 mg/dL (ref 5–40)

## 2016-11-07 LAB — CBC WITH DIFFERENTIAL/PLATELET
BASOS: 0 %
Basophils Absolute: 0 10*3/uL (ref 0.0–0.2)
EOS (ABSOLUTE): 0.2 10*3/uL (ref 0.0–0.4)
Eos: 3 %
Hematocrit: 39.2 % (ref 34.0–46.6)
Hemoglobin: 12.4 g/dL (ref 11.1–15.9)
IMMATURE GRANS (ABS): 0 10*3/uL (ref 0.0–0.1)
IMMATURE GRANULOCYTES: 0 %
Lymphocytes Absolute: 1.6 10*3/uL (ref 0.7–3.1)
Lymphs: 30 %
MCH: 27.8 pg (ref 26.6–33.0)
MCHC: 31.6 g/dL (ref 31.5–35.7)
MCV: 88 fL (ref 79–97)
Monocytes Absolute: 0.7 10*3/uL (ref 0.1–0.9)
Monocytes: 13 %
NEUTROS PCT: 54 %
Neutrophils Absolute: 2.9 10*3/uL (ref 1.4–7.0)
PLATELETS: 289 10*3/uL (ref 150–379)
RBC: 4.46 x10E6/uL (ref 3.77–5.28)
RDW: 14 % (ref 12.3–15.4)
WBC: 5.3 10*3/uL (ref 3.4–10.8)

## 2016-11-07 LAB — TSH: TSH: 1.39 u[IU]/mL (ref 0.450–4.500)

## 2016-11-07 LAB — COMPREHENSIVE METABOLIC PANEL
A/G RATIO: 1.6 (ref 1.2–2.2)
ALT: 17 IU/L (ref 0–32)
AST: 16 IU/L (ref 0–40)
Albumin: 4.4 g/dL (ref 3.5–5.5)
Alkaline Phosphatase: 82 IU/L (ref 39–117)
BUN/Creatinine Ratio: 14 (ref 9–23)
BUN: 10 mg/dL (ref 6–24)
Bilirubin Total: 0.3 mg/dL (ref 0.0–1.2)
CO2: 24 mmol/L (ref 20–29)
Calcium: 9.3 mg/dL (ref 8.7–10.2)
Chloride: 104 mmol/L (ref 96–106)
Creatinine, Ser: 0.73 mg/dL (ref 0.57–1.00)
GFR calc non Af Amer: 103 mL/min/{1.73_m2} (ref >59)
GFR, EST AFRICAN AMERICAN: 118 mL/min/{1.73_m2} (ref >59)
GLUCOSE: 95 mg/dL (ref 65–99)
Globulin, Total: 2.8 g/dL (ref 1.5–4.5)
Potassium: 4.3 mmol/L (ref 3.5–5.2)
Sodium: 144 mmol/L (ref 134–144)
TOTAL PROTEIN: 7.2 g/dL (ref 6.0–8.5)

## 2016-11-07 LAB — VITAMIN B12: Vitamin B-12: 389 pg/mL (ref 232–1245)

## 2016-11-07 LAB — VITAMIN D 25 HYDROXY (VIT D DEFICIENCY, FRACTURES): Vit D, 25-Hydroxy: 23.4 ng/mL — ABNORMAL LOW (ref 30.0–100.0)

## 2016-11-08 ENCOUNTER — Other Ambulatory Visit: Payer: Self-pay | Admitting: Family Medicine

## 2016-11-08 DIAGNOSIS — Z1231 Encounter for screening mammogram for malignant neoplasm of breast: Secondary | ICD-10-CM

## 2016-12-06 ENCOUNTER — Ambulatory Visit
Admission: RE | Admit: 2016-12-06 | Discharge: 2016-12-06 | Disposition: A | Discharge status: Home / Self Care | Payer: 59 | Source: Ambulatory Visit | Attending: Family Medicine | Admitting: Family Medicine

## 2016-12-06 DIAGNOSIS — Z1231 Encounter for screening mammogram for malignant neoplasm of breast: Secondary | ICD-10-CM | POA: Insufficient documentation

## 2016-12-12 ENCOUNTER — Ambulatory Visit (INDEPENDENT_AMBULATORY_CARE_PROVIDER_SITE_OTHER): Payer: 59 | Admitting: Internal Medicine

## 2016-12-12 ENCOUNTER — Encounter: Payer: Self-pay | Admitting: Internal Medicine

## 2016-12-12 VITALS — BP 110/72 | HR 58 | Temp 97.9°F | Wt 211.0 lb

## 2016-12-12 DIAGNOSIS — J0100 Acute maxillary sinusitis, unspecified: Secondary | ICD-10-CM

## 2016-12-12 MED ORDER — AMOXICILLIN-POT CLAVULANATE 875-125 MG PO TABS: 1.0000 | ORAL_TABLET | Freq: Two times a day (BID) | ORAL | 0 refills | Status: DC

## 2016-12-12 NOTE — Progress Notes (Signed)
HPI  Pt presents to the clinic today with c/o headache, ear fullness, runny nose and cough. This started 8 days ago. She is blowing yellow/clear mucous out of her nose. She denies ear pain but has had some decreased hearing. She denies sore throat. The cough is productive of yellow/green mucous. She denies fever, chills or body aches. She has tried Mucinex DM and Tylenol Cold and Flu with minimal relief. She has a history of seasonal allergies. She has had sick contacts.  Review of Systems     Past Medical History:  Diagnosis Date  . Abnormal mammogram, unspecified   . Abnormal weight gain   . Allergy   . Anemia   . Anxiety   . Back pain   . Breast pain, right   . Depression   . Dyspepsia   . Edema   . Fatigue   . GERD (gastroesophageal reflux disease)   . Hypertension    only during pregnancy  . Nonspecific abnormal results of other specified function study   . Palpitations   . Routine general medical examination at a health care facility   . Seizures ([facility])    couple seisure around age 2- tx with dilantin for a period then d/c  . Sleep apnea    c-pap wears    Family History  Problem Relation Age of Onset  . Hypokalemia Sister        deceased at 27; was getting regular IV K infusions; long Hx of pseudotumor cerebri  . Heart disease Father        CABG age 25  . Ovarian cancer Mother   . Stroke Mother 40  . Cancer Mother        ovarian  . Heart attack Unknown        GF - 35  . Coronary artery disease Unknown        family hx  . Hypertension Unknown        family hx  . Diabetes Unknown        family hx  . Breast cancer Maternal Grandmother   . Colon cancer Neg Hx   . Esophageal cancer Neg Hx   . Pancreatic cancer Neg Hx   . Rectal cancer Neg Hx   . Stomach cancer Neg Hx     Social History   Social History  . Marital status: Married    Spouse name: N/A  . Number of children: 2  . Years of education: N/A   Occupational History  . financial analyst [employer]   Social History Main Topics  . Smoking status: Never Smoker  . Smokeless tobacco: Never Used     Comment: nonsmoker  . Alcohol use No  . Drug use: No  . Sexual activity: Not on file   Other Topics Concern  . Not on file   Social History Narrative   Divorced since 9/08; 2 children   3 coffees/day     No Known Allergies   Constitutional: Positive headache. Denies fatigue, fever or abrupt weight changes.  HEENT:  Positive runny nose and ear fullness. Denies eye redness, ear pain, ringing in the ears, wax buildup, nasal congestion or sore throat. Respiratory: Positive cough. Denies difficulty breathing or shortness of breath.  Cardiovascular: Denies chest pain, chest tightness, palpitations or swelling in the hands or feet.   No other specific complaints in a complete review of systems (except as listed in HPI above).  Objective:   BP 110/72   Pulse Marland Kitchen)  58   Temp 97.9 F (36.6 C) (Oral)   Wt 211 lb (95.7 kg)   SpO2 97%   BMI 33.80 kg/m   General: Appears her stated age, ill appearing, in NAD. HEENT: Head: normal shape and size, maxillary sinus tenderness noted; Ears: Tm's gray and intact, normal light reflex; Nose: mucosa boggy and moist, septum midline; Throat/Mouth: + PND. Teeth present, mucosa erythematous and moist, no exudate noted, no lesions or ulcerations noted.  Neck:  No adenopathy noted.  Cardiovascular: Normal rate and rhythm. S1,S2 noted.  No murmur, rubs or gallops noted.  Pulmonary/Chest: Normal effort and positive vesicular breath sounds. No respiratory distress. No wheezes, rales or ronchi noted.       Assessment & Plan:   Acute bacterial sinusitis  Can use a Neti Pot which can be purchased from your local drug store. Flonase 2 sprays each nostril for 3 days and then as needed. eRx for Augmentin BID for 10 days  RTC as needed or if symptoms persist. Nicki Reaper, NP

## 2016-12-12 NOTE — Patient Instructions (Signed)

## 2016-12-27 ENCOUNTER — Encounter: Payer: Self-pay | Admitting: Family Medicine

## 2016-12-27 NOTE — Telephone Encounter (Signed)
Form placed in your box.

## 2016-12-27 NOTE — Telephone Encounter (Signed)
She does not need to schedule an appointment first.  Please print out form and place in my box for completion.

## 2016-12-27 NOTE — Telephone Encounter (Signed)
Pt had a CPE 10/2016 but no discussion of diet changes etc.... Does pt need to schedule office visit?

## 2017-01-08 NOTE — Telephone Encounter (Signed)
Patient called to find out if form is ready for pick up.  Patient can be reached at [phone].

## 2017-01-15 NOTE — Telephone Encounter (Signed)
Patient called back about the form.  Please call patient back at [phone].

## 2017-03-30 ENCOUNTER — Other Ambulatory Visit: Payer: Self-pay | Admitting: Family Medicine

## 2017-08-28 ENCOUNTER — Encounter: Payer: Self-pay | Admitting: Internal Medicine

## 2017-08-28 ENCOUNTER — Telehealth: Payer: Self-pay | Admitting: Internal Medicine

## 2017-08-28 NOTE — Telephone Encounter (Signed)
3 attempts to schedule fu appt from recall list.   Deleting recall.  °Mailed Letter  °

## 2017-09-05 ENCOUNTER — Other Ambulatory Visit: Payer: Self-pay | Admitting: Family Medicine

## 2017-11-12 ENCOUNTER — Ambulatory Visit (INDEPENDENT_AMBULATORY_CARE_PROVIDER_SITE_OTHER): Payer: Managed Care, Other (non HMO) | Admitting: Family Medicine

## 2017-11-12 ENCOUNTER — Encounter: Payer: Self-pay | Admitting: Family Medicine

## 2017-11-12 VITALS — BP 110/60 | HR 72 | Temp 98.5°F | Ht 66.14 in | Wt 197.6 lb

## 2017-11-12 DIAGNOSIS — E538 Deficiency of other specified B group vitamins: Secondary | ICD-10-CM | POA: Diagnosis not present

## 2017-11-12 DIAGNOSIS — E559 Vitamin D deficiency, unspecified: Secondary | ICD-10-CM | POA: Diagnosis not present

## 2017-11-12 DIAGNOSIS — Z0000 Encounter for general adult medical examination without abnormal findings: Secondary | ICD-10-CM | POA: Diagnosis not present

## 2017-11-12 DIAGNOSIS — Z23 Encounter for immunization: Secondary | ICD-10-CM | POA: Diagnosis not present

## 2017-11-12 DIAGNOSIS — Z90710 Acquired absence of both cervix and uterus: Secondary | ICD-10-CM

## 2017-11-12 DIAGNOSIS — Z1231 Encounter for screening mammogram for malignant neoplasm of breast: Secondary | ICD-10-CM

## 2017-11-12 DIAGNOSIS — Z1239 Encounter for other screening for malignant neoplasm of breast: Secondary | ICD-10-CM

## 2017-11-12 NOTE — Assessment & Plan Note (Signed)
Check Vit D today. 

## 2017-11-12 NOTE — Addendum Note (Signed)
Addended by: Lerry LinerFREDERICK, Trell Secrist. M on: 11/12/2017 09:01 AM   Modules accepted: Orders

## 2017-11-12 NOTE — Patient Instructions (Addendum)
Great to see you. I will call you with your lab results from today and you can view them online.   Please call to schedule your mammogram after 12/06/17.

## 2017-11-12 NOTE — Assessment & Plan Note (Signed)
Reviewed preventive care protocols, scheduled due services, and updated immunizations Discussed nutrition, exercise, diet, and healthy lifestyle.  Orders Placed This Encounter  Procedures  . B12  . Vitamin D (25 hydroxy)  . CBC with Differential/Platelet  . Comprehensive metabolic panel  . Lipid panel  . TSH

## 2017-11-12 NOTE — Assessment & Plan Note (Signed)
Check vit b12 today.

## 2017-11-12 NOTE — Progress Notes (Signed)
Subjective:   Patient ID: Shelby Rios, female    DOB: 1976/03/14, 42 y.o.   MRN: [id number]  Shelby Rios is a pleasant 42 y.o. year old female who presents to clinic today with Annual Exam (Patient is here today for a CPE without PAP due to Hysterectomy.  She is currently fasting today and all labs need to go to [facility].  She agrees to get Tdap today.  She has a H/O low Vit-D.  She has not been taking her OTC Vit D3 5k iu for the past 3 months.)  on 11/12/2017  HPI:  Well woman- Health Maintenance  Topic Date Due  . TETANUS/TDAP  09/24/1994  . INFLUENZA VACCINE  11/20/2017  . HIV Screening  Completed   Remote h/o hysterectomy.  Has lost 15 pounds- been going to boot camp three days per week.  Current Outpatient Medications on File Prior to Visit  Medication Sig Dispense Refill  . ALPRAZolam (XANAX) 0.25 MG tablet Take 1 tablet (0.25 mg total) by mouth 2 (two) times daily as needed for anxiety or sleep. 60 tablet 0  . ibuprofen (ADVIL,MOTRIN) 200 MG tablet Take 200 mg by mouth every 6 (six) hours as needed. UAD     . omeprazole (PRILOSEC) 20 MG capsule Take 1 capsule (20 mg total) by mouth 2 (two) times daily before a meal. 28 capsule 3  . Probiotic Product (ALIGN PO) Take by mouth. Once a day    . sertraline (ZOLOFT) 50 MG tablet TAKE 1 TABLET BY MOUTH  DAILY 90 tablet 1  . Cholecalciferol (VITAMIN D3) 5000 units TABS Take 1 tablet by mouth daily.     No current facility-administered medications on file prior to visit.     No Known Allergies  Past Medical History:  Diagnosis Date  . Abnormal mammogram, unspecified   . Abnormal weight gain   . Allergy   . Anemia   . Anxiety   . Back pain   . Breast pain, right   . Depression   . Dyspepsia   . Edema   . Fatigue   . GERD (gastroesophageal reflux disease)   . Hypertension    only during pregnancy  . Nonspecific abnormal results of other specified function study   . Palpitations   . Routine general medical  examination at a health care facility   . Seizures ([facility])    couple seisure around age 89- tx with dilantin for a period then d/c  . Sleep apnea    c-pap wears    Past Surgical History:  Procedure Laterality Date  . adenoidectomy    . BRCA test     neg  . DILATION AND CURETTAGE OF UTERUS    . PARTIAL HYSTERECTOMY  2008   endometriosis  . TONSILLECTOMY    . WISDOM TOOTH EXTRACTION      Family History  Problem Relation Age of Onset  . Hypokalemia Sister        deceased at 35; was getting regular IV K infusions; long Hx of pseudotumor cerebri  . Heart disease Father        CABG age 78  . Ovarian cancer Mother   . Stroke Mother 48  . Cancer Mother        ovarian  . Heart attack Unknown        GF - 35  . Coronary artery disease Unknown        family hx  . Hypertension Unknown  family hx  . Diabetes Unknown        family hx  . Breast cancer Maternal Grandmother   . Colon cancer Neg Hx   . Esophageal cancer Neg Hx   . Pancreatic cancer Neg Hx   . Rectal cancer Neg Hx   . Stomach cancer Neg Hx     Social History   Socioeconomic History  . Marital status: Married    Spouse name: Not on file  . Number of children: 2  . Years of education: Not on file  . Highest education level: Not on file  Occupational History  . Occupation: Primary school teacher: [employer]  . Financial resource strain: Not on file  . Food insecurity:    Worry: Not on file    Inability: Not on file  . Transportation needs:    Medical: Not on file    Non-medical: Not on file  Tobacco Use  . Smoking status: Never Smoker  . Smokeless tobacco: Never Used  . Tobacco comment: nonsmoker  Substance and Sexual Activity  . Alcohol use: No    Alcohol/week: 0.0 oz  . Drug use: No  . Sexual activity: Not on file  Lifestyle  . Physical activity:    Days per week: Not on file    Minutes per session: Not on file  . Stress: Not on file  Relationships  . Social  connections:    Talks on phone: Not on file    Gets together: Not on file    Attends religious service: Not on file    Active member of club or organization: Not on file    Attends meetings of clubs or organizations: Not on file    Relationship status: Not on file  . Intimate partner violence:    Fear of current or ex partner: Not on file    Emotionally abused: Not on file    Physically abused: Not on file    Forced sexual activity: Not on file  Other Topics Concern  . Not on file  Social History Narrative   Divorced since 9/08; 2 children   3 coffees/day    The PMH, PSH, Social History, Family History, Medications, and allergies have been reviewed in [facility], and have been updated if relevant.   Review of Systems  Constitutional: Negative.   HENT: Negative.   Respiratory: Negative.   Cardiovascular: Negative.   Gastrointestinal: Negative.   Endocrine: Negative.   Genitourinary: Negative.   Musculoskeletal: Negative.   Allergic/Immunologic: Negative.   Neurological: Negative.   Hematological: Negative.   Psychiatric/Behavioral: Negative.   All other systems reviewed and are negative.      Objective:    BP 110/60 (BP Location: Left Arm, Patient Position: Sitting, Cuff Size: Normal)   Pulse 72   Temp 98.5 F (36.9 C) (Oral)   Ht 5' 6.14" (1.68 m)   Wt 197 lb 9.6 oz (89.6 kg)   SpO2 96%   BMI 31.76 kg/m   Wt Readings from Last 3 Encounters:  11/12/17 197 lb 9.6 oz (89.6 kg)  12/12/16 211 lb (95.7 kg)  11/06/16 212 lb (96.2 kg)    Physical Exam    General:  Well-developed,well-nourished,in no acute distress; alert,appropriate and cooperative throughout examination Head:  normocephalic and atraumatic.   Eyes:  vision grossly intact, PERRL Ears:  R ear normal and L ear normal externally, TMs clear bilaterally Nose:  no external deformity.   Mouth:  good dentition.  Neck:  No deformities, masses, or tenderness noted. Breasts:  No mass, nodules, thickening,  tenderness, bulging, retraction, inflamation, nipple discharge or skin changes noted.   Lungs:  Normal respiratory effort, chest expands symmetrically. Lungs are clear to auscultation, no crackles or wheezes. Heart:  Normal rate and regular rhythm. S1 and S2 normal without gallop, murmur, click, rub or other extra sounds. Abdomen:  Bowel sounds positive,abdomen soft and non-tender without masses, organomegaly or hernias noted. Msk:  No deformity or scoliosis noted of thoracic or lumbar spine.   Extremities:  No clubbing, cyanosis, edema, or deformity noted with normal full range of motion of all joints.   Neurologic:  alert & oriented X3 and gait normal.   Skin:  Intact without suspicious lesions or rashes Cervical Nodes:  No lymphadenopathy noted Axillary Nodes:  No palpable lymphadenopathy Psych:  Cognition and judgment appear intact. Alert and cooperative with normal attention span and concentration. No apparent delusions, illusions, hallucinations      Assessment & Plan:   Well woman exam without gynecological exam - Plan: CBC with Differential/Platelet, Comprehensive metabolic panel, Lipid panel, TSH, CANCELED: CBC with Differential/Platelet, CANCELED: Comprehensive metabolic panel, CANCELED: Lipid panel  Need for Tdap vaccination - Plan: CANCELED: Tdap vaccine greater than or equal to 7yo IM  Vitamin D deficiency - Plan: Vitamin D (25 hydroxy), CANCELED: Vitamin D (25 hydroxy)  Vitamin B12 deficiency - Plan: B12, CANCELED: B12  History of hysterectomy No follow-ups on file.

## 2017-11-13 ENCOUNTER — Telehealth: Payer: Self-pay

## 2017-11-13 DIAGNOSIS — E559 Vitamin D deficiency, unspecified: Secondary | ICD-10-CM

## 2017-11-13 LAB — CBC WITH DIFFERENTIAL/PLATELET
BASOS: 1 %
Basophils Absolute: 0 10*3/uL (ref 0.0–0.2)
EOS (ABSOLUTE): 0.2 10*3/uL (ref 0.0–0.4)
EOS: 3 %
HEMATOCRIT: 40.1 % (ref 34.0–46.6)
HEMOGLOBIN: 13.1 g/dL (ref 11.1–15.9)
Immature Grans (Abs): 0 10*3/uL (ref 0.0–0.1)
Immature Granulocytes: 0 %
LYMPHS ABS: 1.5 10*3/uL (ref 0.7–3.1)
Lymphs: 22 %
MCH: 28 pg (ref 26.6–33.0)
MCHC: 32.7 g/dL (ref 31.5–35.7)
MCV: 86 fL (ref 79–97)
MONOS ABS: 0.7 10*3/uL (ref 0.1–0.9)
Monocytes: 11 %
NEUTROS PCT: 63 %
Neutrophils Absolute: 4 10*3/uL (ref 1.4–7.0)
Platelets: 300 10*3/uL (ref 150–450)
RBC: 4.68 x10E6/uL (ref 3.77–5.28)
RDW: 12.6 % (ref 12.3–15.4)
WBC: 6.5 10*3/uL (ref 3.4–10.8)

## 2017-11-13 LAB — LIPID PANEL
CHOL/HDL RATIO: 3.6 ratio (ref 0.0–4.4)
CHOLESTEROL TOTAL: 189 mg/dL (ref 100–199)
HDL: 53 mg/dL (ref >39)
LDL CALC: 123 mg/dL — AB (ref 0–99)
Triglycerides: 66 mg/dL (ref 0–149)
VLDL Cholesterol Cal: 13 mg/dL (ref 5–40)

## 2017-11-13 LAB — COMPREHENSIVE METABOLIC PANEL
A/G RATIO: 1.5 (ref 1.2–2.2)
ALBUMIN: 4.4 g/dL (ref 3.5–5.5)
ALK PHOS: 72 IU/L (ref 39–117)
ALT: 17 IU/L (ref 0–32)
AST: 17 IU/L (ref 0–40)
BUN / CREAT RATIO: 21 (ref 9–23)
BUN: 14 mg/dL (ref 6–24)
Bilirubin Total: 0.2 mg/dL (ref 0.0–1.2)
CO2: 25 mmol/L (ref 20–29)
CREATININE: 0.68 mg/dL (ref 0.57–1.00)
Calcium: 9.1 mg/dL (ref 8.7–10.2)
Chloride: 100 mmol/L (ref 96–106)
GFR calc Af Amer: 125 mL/min/{1.73_m2} (ref >59)
GFR, EST NON AFRICAN AMERICAN: 108 mL/min/{1.73_m2} (ref >59)
GLOBULIN, TOTAL: 2.9 g/dL (ref 1.5–4.5)
Glucose: 95 mg/dL (ref 65–99)
POTASSIUM: 4.2 mmol/L (ref 3.5–5.2)
Sodium: 141 mmol/L (ref 134–144)
Total Protein: 7.3 g/dL (ref 6.0–8.5)

## 2017-11-13 LAB — VITAMIN D 25 HYDROXY (VIT D DEFICIENCY, FRACTURES): Vit D, 25-Hydroxy: 22.4 ng/mL — ABNORMAL LOW (ref 30.0–100.0)

## 2017-11-13 LAB — TSH: TSH: 1.24 u[IU]/mL (ref 0.450–4.500)

## 2017-11-13 LAB — VITAMIN B12: VITAMIN B 12: 463 pg/mL (ref 232–1245)

## 2017-11-13 MED ORDER — VITAMIN D (ERGOCALCIFEROL) 1.25 MG (50000 UNIT) PO CAPS: 50000.0000 [IU] | ORAL_CAPSULE | ORAL | 0 refills | Status: AC

## 2017-11-13 NOTE — Telephone Encounter (Signed)
-----   Message from Dianne Dunalia M Aron, MD sent at 11/13/2017  7:55 AM EDT ----- Please let pt know that her cbc, thyroid function, liver function, kidney function and electrolytes look excellent. Keep up the good work. Cholesterol up just a bit but no where near starting medication for it- just cut back on foods higher in saturated fats, fried foods. Vit Gabriel Conry is low- please call in Vit D3 50,000 units x 8 weeks - 1 tab po weekly x  weeks (number 8, no refills), then continue VitD 1600 IU daily.  Recheck in 8-12 weeks (268.0)

## 2017-11-13 NOTE — Telephone Encounter (Signed)
LMOVM/I advised that I saw she had viewed her results and I am sending in her prescription for the Vit-Labib Cwynar 50k IU 1qwk x8 wks then she is to take 1.6k iu OTC after she finishes the 8 week Rx/I asked her to call back and schedule a lab visit for 8-12 weeks from now for us to recheck the levels/created future order/thx dmf

## 2017-12-09 ENCOUNTER — Ambulatory Visit
Admission: RE | Admit: 2017-12-09 | Discharge: 2017-12-09 | Disposition: A | Discharge status: Home / Self Care | Payer: Managed Care, Other (non HMO) | Source: Ambulatory Visit | Attending: Family Medicine | Admitting: Family Medicine

## 2017-12-09 DIAGNOSIS — Z1239 Encounter for other screening for malignant neoplasm of breast: Secondary | ICD-10-CM

## 2017-12-09 DIAGNOSIS — Z1231 Encounter for screening mammogram for malignant neoplasm of breast: Secondary | ICD-10-CM | POA: Insufficient documentation

## 2017-12-24 ENCOUNTER — Encounter: Payer: Self-pay | Admitting: Family Medicine

## 2018-01-13 ENCOUNTER — Encounter: Payer: Self-pay | Admitting: Nurse Practitioner

## 2018-01-13 ENCOUNTER — Ambulatory Visit: Payer: Self-pay

## 2018-01-13 ENCOUNTER — Ambulatory Visit (INDEPENDENT_AMBULATORY_CARE_PROVIDER_SITE_OTHER): Payer: Managed Care, Other (non HMO) | Admitting: Nurse Practitioner

## 2018-01-13 ENCOUNTER — Ambulatory Visit: Payer: Managed Care, Other (non HMO) | Admitting: Nurse Practitioner

## 2018-01-13 VITALS — BP 110/88 | HR 71 | Temp 98.1°F | Ht 66.14 in | Wt 200.0 lb

## 2018-01-13 DIAGNOSIS — R519 Headache, unspecified: Secondary | ICD-10-CM

## 2018-01-13 DIAGNOSIS — R51 Headache: Secondary | ICD-10-CM | POA: Diagnosis not present

## 2018-01-13 DIAGNOSIS — Z23 Encounter for immunization: Secondary | ICD-10-CM

## 2018-01-13 DIAGNOSIS — H538 Other visual disturbances: Secondary | ICD-10-CM | POA: Diagnosis not present

## 2018-01-13 DIAGNOSIS — H6983 Other specified disorders of Eustachian tube, bilateral: Secondary | ICD-10-CM | POA: Diagnosis not present

## 2018-01-13 MED ORDER — FLUTICASONE PROPIONATE 50 MCG/ACT NA SUSP: 2.0000 | Freq: Every day | NASAL | 0 refills | Status: DC

## 2018-01-13 MED ORDER — ASPIRIN-ACETAMINOPHEN-CAFFEINE 250-250-65 MG PO TABS
1.0000 | ORAL_TABLET | Freq: Three times a day (TID) | ORAL | 0 refills | Status: DC | PRN
Start: 2018-01-13 — End: 2020-06-22

## 2018-01-13 MED ORDER — FEXOFENADINE HCL 180 MG PO TABS: 180.0000 mg | ORAL_TABLET | Freq: Every day | ORAL | Status: DC

## 2018-01-13 NOTE — Progress Notes (Signed)
Subjective:  Patient ID: Shelby Rios, female    DOB: 1975-10-10  Age: 42 y.o. MRN: [id number]  CC: Dizziness (pt is complaining of dizzieness with movements,headache,head pressure,fatigue,right eye blurry/ going on 2 days/ took allegra otc/flu shot)   Dizziness  This is a new problem. The current episode started in the past 7 days. The problem occurs intermittently. The problem has been gradually improving. Associated symptoms include fatigue, headaches, vertigo and a visual change. Pertinent negatives include no anorexia, congestion, coughing, diaphoresis, fever, nausea, neck pain, numbness, rash, sore throat, swollen glands, urinary symptoms or weakness. The symptoms are aggravated by bending. She has tried rest (antihistamine and decongestant) for the symptoms. The treatment provided mild relief.    dizziness and ear fullness, and frontal headache. Hx of headache as child and intermittently as an adult. Hx of seizure at age 42. Blurry vision in right eye yesterday but now improved. No LOC Use of corrective len, no change recently. No head injury. No recent URI. No tobacco or caffeine use.  Allegra D and tylenol taken yesterday and today. No change in prescriptions recently.  Reviewed past Medical, Social and Family history today.  Outpatient Medications Prior to Visit  Medication Sig Dispense Refill  . ibuprofen (ADVIL,MOTRIN) 200 MG tablet Take 200 mg by mouth every 6 (six) hours as needed. UAD     . omeprazole (PRILOSEC) 20 MG capsule Take 1 capsule (20 mg total) by mouth 2 (two) times daily before a meal. 28 capsule 3  . Probiotic Product (ALIGN PO) Take by mouth. Once a day    . sertraline (ZOLOFT) 50 MG tablet TAKE 1 TABLET BY MOUTH  DAILY 90 tablet 1  . Vitamin D, Cholecalciferol, 1000 units CAPS Take by mouth.    . ALPRAZolam (XANAX) 0.25 MG tablet Take 1 tablet (0.25 mg total) by mouth 2 (two) times daily as needed for anxiety or sleep. (Patient not taking: Reported on  01/13/2018) 60 tablet 0  . Cholecalciferol (VITAMIN D3) 5000 units TABS Take 1 tablet by mouth daily.     No facility-administered medications prior to visit.     ROS See HPI  Objective:  BP 110/88   Pulse 71   Temp 98.1 F (36.7 C) (Oral)   Ht 5' 6.14" (1.68 m)   Wt 200 lb (90.7 kg)   SpO2 98%   BMI 32.14 kg/m   BP Readings from Last 3 Encounters:  01/13/18 110/88  11/12/17 110/60  12/12/16 110/72    Wt Readings from Last 3 Encounters:  01/13/18 200 lb (90.7 kg)  11/12/17 197 lb 9.6 oz (89.6 kg)  12/12/16 211 lb (95.7 kg)    Physical Exam  Constitutional: She is oriented to person, place, and time. She appears well-developed and well-nourished.  HENT:  Head: Normocephalic.  Right Ear: External ear and ear canal normal. No mastoid tenderness. Tympanic membrane is not injected and not erythematous. A middle ear effusion is present.  Left Ear: External ear and ear canal normal. No mastoid tenderness. Tympanic membrane is not injected and not erythematous. A middle ear effusion is present.  Nose: Right sinus exhibits no maxillary sinus tenderness and no frontal sinus tenderness. Left sinus exhibits no maxillary sinus tenderness and no frontal sinus tenderness.  Mouth/Throat: Uvula is midline and oropharynx is clear and moist. No trismus in the jaw. Normal dentition. No posterior oropharyngeal erythema.  No tenderness over TMJ  Eyes: Pupils are equal, round, and reactive to light. Conjunctivae, EOM and lids are normal.  Neck: Normal range of motion. Neck supple.  Cardiovascular: Normal rate.  Pulmonary/Chest: Effort normal.  Lymphadenopathy:    She has no cervical adenopathy.  Neurological: She is alert and oriented to person, place, and time. No cranial nerve deficit. Coordination normal.  Psychiatric: She has a normal mood and affect. Her behavior is normal. Thought content normal.    Lab Results  Component Value Date   WBC 6.5 11/12/2017   HGB 13.1 11/12/2017    HCT 40.1 11/12/2017   PLT 300 11/12/2017   GLUCOSE 95 11/12/2017   CHOL 189 11/12/2017   TRIG 66 11/12/2017   HDL 53 11/12/2017   LDLCALC 123 (H) 11/12/2017   ALT 17 11/12/2017   AST 17 11/12/2017   NA 141 11/12/2017   K 4.2 11/12/2017   CL 100 11/12/2017   CREATININE 0.68 11/12/2017   BUN 14 11/12/2017   CO2 25 11/12/2017   TSH 1.240 11/12/2017   HGBA1C 5.0 06/24/2016    Mm 3d Screen Breast Bilateral  Result Date: 12/10/2017 CLINICAL DATA:  Screening. EXAM: DIGITAL SCREENING BILATERAL MAMMOGRAM WITH TOMO AND CAD COMPARISON:  Previous exam(s). ACR Breast Density Category c: The breast tissue is heterogeneously dense, which may obscure small masses. FINDINGS: There are no findings suspicious for malignancy. Images were processed with CAD. IMPRESSION: No mammographic evidence of malignancy. A result letter of this screening mammogram will be mailed directly to the patient. RECOMMENDATION: Screening mammogram in one year. (Code:SM-B-01Y) BI-RADS CATEGORY  1: Negative. Electronically Signed   By: Bary RichardStan  Maynard M.D.   On: 12/10/2017 12:59    Assessment & Plan:   Shelby RiegerLaura was seen today for dizziness.  Diagnoses and all orders for this visit:  Acute intractable headache, unspecified headache type -     aspirin-acetaminophen-caffeine (EXCEDRIN EXTRA STRENGTH) 250-250-65 MG tablet; Take 1 tablet by mouth every 8 (eight) hours as needed for headache.  Blurry vision, right eye -     Visual acuity screening  Dysfunction of both eustachian tubes -     fexofenadine (ALLEGRA) 180 MG tablet; Take 1 tablet (180 mg total) by mouth daily. -     fluticasone (FLONASE) 50 MCG/ACT nasal spray; Place 2 sprays into both nostrils daily.  Need for influenza vaccination -     Flu Vaccine QUAD 6+ mos PF IM (Fluarix Quad PF)   I am having Shelby Rios start on fexofenadine, fluticasone, and aspirin-acetaminophen-caffeine. I am also having her maintain her ibuprofen, ALPRAZolam, Vitamin D3, omeprazole,  Probiotic Product (ALIGN PO), sertraline, and Vitamin D (Cholecalciferol).  Meds ordered this encounter  Medications  . fexofenadine (ALLEGRA) 180 MG tablet    Sig: Take 1 tablet (180 mg total) by mouth daily.    Order Specific Question:   Supervising Provider    Answer:   Dianne DunARON, TALIA M [[id number]]  . fluticasone (FLONASE) 50 MCG/ACT nasal spray    Sig: Place 2 sprays into both nostrils daily.    Dispense:  16 g    Refill:  0    Order Specific Question:   Supervising Provider    Answer:   Dianne DunARON, TALIA M [[id number]]  . aspirin-acetaminophen-caffeine (EXCEDRIN EXTRA STRENGTH) 250-250-65 MG tablet    Sig: Take 1 tablet by mouth every 8 (eight) hours as needed for headache.    Dispense:  30 tablet    Refill:  0    Order Specific Question:   Supervising Provider    Answer:   Dianne DunARON, TALIA M [[id number]]    Follow-up: Return if symptoms  worsen or fail to improve.  Wilfred Lacy, NP

## 2018-01-13 NOTE — Telephone Encounter (Signed)
Pt called with C/O dizziness/vertigo, blurred vision to rt eye yesterday.  Today when she got out of bed she had a severe headache which she treated with tylenol. Pt states that the dizziness and vertigo today are very mild. Denies fever. Vision is not a concern today.  She felt her ear might be draining but when she checked it was not. She took allegra for mild seasonal allergies although she states she has no stuffiness or  nasal drainage. Appointment made per protocol. Care advice given to patient. Pt verbalized understanding of all.  Reason for Disposition . [1] MILD dizziness (e.g., vertigo; walking normally) AND [2] has NOT been evaluated by physician for this  Answer Assessment - Initial Assessment Questions 1. DESCRIPTION: "Describe your dizziness."     Spinning unstable when walking 2. VERTIGO: "Do you feel like either you or the room is spinning or tilting?"      spinning 3. LIGHTHEADED: "Do you feel lightheaded?" (e.g., somewhat faint, woozy, weak upon standing)     Off balance yesterday 4. SEVERITY: "How bad is it?"  "Can you walk?"   - MILD - Feels unsteady but walking normally.   - MODERATE - Feels very unsteady when walking, but not falling; interferes with normal activities (e.g., school, work) .   - SEVERE - Unable to walk without falling (requires assistance).     mild 5. ONSET:  "When did the dizziness begin?"     Yesterday suddenly 6. AGGRAVATING FACTORS: "Does anything make it worse?" (e.g., standing, change in head position)     Better when lying down 7. CAUSE: "What do you think is causing the dizziness?"     Mild allergies 8. RECURRENT SYMPTOM: "Have you had dizziness before?" If so, ask: "When was the last time?" "What happened that time?"     no 9. OTHER SYMPTOMS: "Do you have any other symptoms?" (e.g., headache, weakness, numbness, vomiting, earache)     HA 10. PREGNANCY: "Is there any chance you are pregnant?" "When was your last menstrual period?"  histerectomy  Protocols used: DIZZINESS - VERTIGO-A-AH

## 2018-01-13 NOTE — Patient Instructions (Addendum)
Call office if no improvement in 2-3days.  Vision Screen today (corrected): left and right eye: 20/20 Both eyes: 20/13  General Headache Without Cause A headache is pain or discomfort felt around the head or neck area. There are many causes and types of headaches. In some cases, the cause may not be found. Follow these instructions at home: Managing pain  Take over-the-counter and prescription medicines only as told by your doctor.  Lie down in a dark, quiet room when you have a headache.  If directed, apply ice to the head and neck area: ? Put ice in a plastic bag. ? Place a towel between your skin and the bag. ? Leave the ice on for 20 minutes, 2-3 times per day.  Use a heating pad or hot shower to apply heat to the head and neck area as told by your doctor.  Keep lights dim if bright lights bother you or make your headaches worse. Eating and drinking  Eat meals on a regular schedule.  Lessen how much alcohol you drink.  Lessen how much caffeine you drink, or stop drinking caffeine. General instructions  Keep all follow-up visits as told by your doctor. This is important.  Keep a journal to find out if certain things bring on headaches. For example, write down: ? What you eat and drink. ? How much sleep you get. ? Any change to your diet or medicines.  Relax by getting a massage or doing other relaxing activities.  Lessen stress.  Sit up straight. Do not tighten (tense) your muscles.  Do not use tobacco products. This includes cigarettes, chewing tobacco, or e-cigarettes. If you need help quitting, ask your doctor.  Exercise regularly as told by your doctor.  Get enough sleep. This often means 7-9 hours of sleep. Contact a doctor if:  Your symptoms are not helped by medicine.  You have a headache that feels different than the other headaches.  You feel sick to your stomach (nauseous) or you throw up (vomit).  You have a fever. Get help right away  if:  Your headache becomes really bad.  You keep throwing up.  You have a stiff neck.  You have trouble seeing.  You have trouble speaking.  You have pain in the eye or ear.  Your muscles are weak or you lose muscle control.  You lose your balance or have trouble walking.  You feel like you will pass out (faint) or you pass out.  You have confusion. This information is not intended to replace advice given to you by your health care provider. Make sure you discuss any questions you have with your health care provider. Document Released: 01/16/2008 Document Revised: 09/14/2015 Document Reviewed: 08/01/2014 Elsevier Interactive Patient Education  [employer]2018 Elsevier Inc.

## 2018-01-26 ENCOUNTER — Other Ambulatory Visit: Payer: Self-pay | Admitting: Family Medicine

## 2018-02-04 ENCOUNTER — Other Ambulatory Visit: Payer: Self-pay | Admitting: Nurse Practitioner

## 2018-02-04 DIAGNOSIS — H6983 Other specified disorders of Eustachian tube, bilateral: Secondary | ICD-10-CM

## 2018-04-01 ENCOUNTER — Encounter: Payer: Self-pay | Admitting: Family Medicine

## 2018-04-06 ENCOUNTER — Encounter: Payer: Self-pay | Admitting: Family Medicine

## 2018-04-07 ENCOUNTER — Encounter: Payer: Self-pay | Admitting: Neurology

## 2018-04-07 ENCOUNTER — Other Ambulatory Visit: Payer: Self-pay | Admitting: Family Medicine

## 2018-04-07 DIAGNOSIS — R519 Headache, unspecified: Secondary | ICD-10-CM

## 2018-04-07 DIAGNOSIS — R51 Headache: Principal | ICD-10-CM

## 2018-04-07 DIAGNOSIS — H538 Other visual disturbances: Secondary | ICD-10-CM

## 2018-06-11 NOTE — Progress Notes (Signed)
NEUROLOGY CONSULTATION NOTE  Shelby Rios MRN: [id number] DOB: Aug 04, 1975  Referring provider: Arnette Norris, MD Primary care provider: Arnette Norris, MD  Reason for consult:  Headache, blurred vision  HISTORY OF PRESENT ILLNESS: Shelby Rios is a 43 year old right-handed Caucasian woman who presents for headache and blurred vision.  History supplemented by primary care note.  In September 2019, she woke up with headache described as a heaviness in the left occipital region.  She had associated vertigo, nausea, blurred vision in right eye, and fatigue.  There was no associated aura, vomiting, fever, dysarthria, dysphagia or unilateral numbness or weakness.  She was diagnosed with migraines and eustachian tube dysfunction and prescribed Excedrin Extra Strength, Allegra and Flonase.  Symptoms subsequently subsided.  She started having headaches again in December.  They are a moderate to severe pressure in the left occipital region into the neck that becomes bifrontal and right temple.  They typically last an hour but sometimes they last 3 or 4 hours.  She was getting them once a week but hasn't had one in about 2 weeks.  Maybe slight phonophobia.  No nausea, vomiting, photophobia.  When severe, she notes paresthesias above the right eyebrow.  The day before or the day after a headache, she reports slight blurred vision.  Since the initial event, she has woken up a couple of times feeling off-balance.  There are no associated triggers.  Rest, Flonase and Excedrin relieves them.  She also notes increased episodes of brief shivering of her entire body.  They last a moment.  They are separate from her headaches.  Eye exam was unremarkable.  She does have history of headaches as a child and intermittently as an adult.  However, these are different.  Other past medical history notable for isolated seizure at age 80.  Current NSAIDS:  ibuprofen Current analgesics:  Excedrin Extra-Strength Current triptans:   none Current ergotamine:  none Current anti-emetic:  none Current muscle relaxants:  none Current anti-anxiolytic:  Xanax 0.'25mg'$  at bedtime Current sleep aide:  Xanax 0.'25mg'$  Current Antihypertensive medications:  none Current Antidepressant medications:  Sertraline '50mg'$  Current Anticonvulsant medications:  none Current anti-CGRP:  none Current Vitamins/Herbal/Supplements:  D3 Current Antihistamines/Decongestants:  Flonase Other therapy:  none Hormone/birth control:  none Other medications:  none  Past NSAIDS:  none Past analgesics:  none Past abortive triptans:  none Past abortive ergotamine:  none Past muscle relaxants:  none Past anti-emetic:  none Past antihypertensive medications:  none Past antidepressant medications:  none Past anticonvulsant medications:  none Past anti-CGRP:  none Past vitamins/Herbal/Supplements:  none Past antihistamines/decongestants:  none Other past therapies:  none  Caffeine:  4 to 6 cups of coffee daily. Diet:  72 oz water daily.  Does not skip meals.   Exercise:  yes Depression: stable; Anxiety:  stable Other pain:  Nothing significant Sleep hygiene:  Good. Family history of headache: mom Other family history:  Mother has epilepsy.  Sister died of complications of untreated Chiari malformation.  CBC and CMP from 11/12/17 were normal.  PAST MEDICAL HISTORY: Past Medical History:  Diagnosis Date  . Abnormal mammogram, unspecified   . Abnormal weight gain   . Allergy   . Anemia   . Anxiety   . Back pain   . Breast pain, right   . Depression   . Dyspepsia   . Edema   . Fatigue   . GERD (gastroesophageal reflux disease)   . Hypertension    only during  pregnancy  . Nonspecific abnormal results of other specified function study   . Palpitations   . Routine general medical examination at a health care facility   . Seizures ([place]ise)    couple seisure around age 89- tx with dilantin for a period then d/c  . Sleep apnea    c-pap wears     PAST SURGICAL HISTORY: Past Surgical History:  Procedure Laterality Date  . adenoidectomy    . BRCA test     neg  . DILATION AND CURETTAGE OF UTERUS    . PARTIAL HYSTERECTOMY  2008   endometriosis  . TONSILLECTOMY    . WISDOM TOOTH EXTRACTION      MEDICATIONS: Current Outpatient Medications on File Prior to Visit  Medication Sig Dispense Refill  . ALPRAZolam (XANAX) 0.25 MG tablet Take 1 tablet (0.25 mg total) by mouth 2 (two) times daily as needed for anxiety or sleep. (Patient not taking: Reported on 01/13/2018) 60 tablet 0  . aspirin-acetaminophen-caffeine (EXCEDRIN EXTRA STRENGTH) 250-250-65 MG tablet Take 1 tablet by mouth every 8 (eight) hours as needed for headache. 30 tablet 0  . Cholecalciferol (VITAMIN D3) 5000 units TABS Take 1 tablet by mouth daily.    . fexofenadine (ALLEGRA) 180 MG tablet Take 1 tablet (180 mg total) by mouth daily.    . fluticasone (FLONASE) 50 MCG/ACT nasal spray SPRAY 2 SPRAYS INTO EACH NOSTRIL EVERY DAY 16 g 0  . ibuprofen (ADVIL,MOTRIN) 200 MG tablet Take 200 mg by mouth every 6 (six) hours as needed. UAD     . omeprazole (PRILOSEC) 20 MG capsule Take 1 capsule (20 mg total) by mouth 2 (two) times daily before a meal. 28 capsule 3  . Probiotic Product (ALIGN PO) Take by mouth. Once a day    . sertraline (ZOLOFT) 50 MG tablet TAKE 1 TABLET BY MOUTH  DAILY 90 tablet 1  . Vitamin D, Cholecalciferol, 1000 units CAPS Take by mouth.     No current facility-administered medications on file prior to visit.     ALLERGIES: No Known Allergies  FAMILY HISTORY: Family History  Problem Relation Age of Onset  . Hypokalemia Sister        deceased at 61; was getting regular IV K infusions; long Hx of pseudotumor cerebri  . Heart disease Father        CABG age 51  . Ovarian cancer Mother   . Stroke Mother 51  . Cancer Mother        ovarian  . Heart attack Unknown        GF - 35  . Coronary artery disease Unknown        family hx  . Hypertension  Unknown        family hx  . Diabetes Unknown        family hx  . Breast cancer Maternal Grandmother   . Colon cancer Neg Hx   . Esophageal cancer Neg Hx   . Pancreatic cancer Neg Hx   . Rectal cancer Neg Hx   . Stomach cancer Neg Hx    SOCIAL HISTORY: Social History   Socioeconomic History  . Marital status: Married    Spouse name: Not on file  . Number of children: 2  . Years of education: Not on file  . Highest education level: Not on file  Occupational History  . Occupation: Primary school teacher: [employer]  . Financial resource strain: Not on file  . Food  insecurity:    Worry: Not on file    Inability: Not on file  . Transportation needs:    Medical: Not on file    Non-medical: Not on file  Tobacco Use  . Smoking status: Never Smoker  . Smokeless tobacco: Never Used  . Tobacco comment: nonsmoker  Substance and Sexual Activity  . Alcohol use: No    Alcohol/week: 0.0 standard drinks  . Drug use: No  . Sexual activity: Not on file  Lifestyle  . Physical activity:    Days per week: Not on file    Minutes per session: Not on file  . Stress: Not on file  Relationships  . Social connections:    Talks on phone: Not on file    Gets together: Not on file    Attends religious service: Not on file    Active member of club or organization: Not on file    Attends meetings of clubs or organizations: Not on file    Relationship status: Not on file  . Intimate partner violence:    Fear of current or ex partner: Not on file    Emotionally abused: Not on file    Physically abused: Not on file    Forced sexual activity: Not on file  Other Topics Concern  . Not on file  Social History Narrative   Divorced since 9/08; 2 children   3 coffees/day     REVIEW OF SYSTEMS: Constitutional: No fevers, chills, or sweats, no generalized fatigue, change in appetite Eyes: No visual changes, double vision, eye pain Ear, nose and throat: No hearing loss, ear  pain, nasal congestion, sore throat Cardiovascular: No chest pain, palpitations Respiratory:  No shortness of breath at rest or with exertion, wheezes GastrointestinaI: No nausea, vomiting, diarrhea, abdominal pain, fecal incontinence Genitourinary:  No dysuria, urinary retention or frequency Musculoskeletal:  No neck pain, back pain Integumentary: No rash, pruritus, skin lesions Neurological: as above Psychiatric: No depression, insomnia, anxiety Endocrine: No palpitations, fatigue, diaphoresis, mood swings, change in appetite, change in weight, increased thirst Hematologic/Lymphatic:  No purpura, petechiae. Allergic/Immunologic: no itchy/runny eyes, nasal congestion, recent allergic reactions, rashes  PHYSICAL EXAM: Blood pressure 112/90, pulse 77, height '5\' 7"'$  (1.702 m), weight 208 lb (94.3 kg), SpO2 98 %. General: No acute distress.  Patient appears well-groomed.   Head:  Normocephalic/atraumatic Eyes:  fundi examined but not visualized Neck: supple, no paraspinal tenderness, full range of motion Back: No paraspinal tenderness Heart: regular rate and rhythm Lungs: Clear to auscultation bilaterally. Vascular: No carotid bruits. Neurological Exam: Mental status: alert and oriented to person, place, and time, recent and remote memory intact, fund of knowledge intact, attention and concentration intact, speech fluent and not dysarthric, language intact. Cranial nerves: CN I: not tested CN II: pupils equal, round and reactive to light, visual fields intact CN III, IV, VI:  full range of motion, no nystagmus, no ptosis CN V: facial sensation intact CN VII: upper and lower face symmetric CN VIII: hearing intact CN IX, X: gag intact, uvula midline CN XI: sternocleidomastoid and trapezius muscles intact CN XII: tongue midline Bulk & Tone: normal, no fasciculations. Motor:  5/5 throughout  Sensation: temperature and vibration sensation intact.  Deep Tendon Reflexes:  2+ throughout,  toes downgoing.   Finger to nose testing:  Without dysmetria.   Heel to shin:  Without dysmetria.   Gait:  Normal station and stride.  Romberg negative.  IMPRESSION: Migraine with aura, without status migrainosus, not intractable  PLAN:  1.  Will defer preventative medication at this time 2.  For abortive therapy, Excedrin Extra-strength.  If headache not resolved after one hour, then rizatriptan '10mg'$  3.  Limit use of pain relievers to no more than 2 days out of week to prevent risk of rebound or medication-overuse headache. 4.  Keep headache diary 5.  Exercise, hydration, caffeine cessation, sleep hygiene, monitor for and avoid triggers 6.  Consider:  magnesium citrate '400mg'$  daily, riboflavin '400mg'$  daily, and coenzyme Q10 '100mg'$  three times daily 7.  Follow up in 4 months   Thank you for allowing me to take part in the care of this patient.  Metta Clines, DO  CC: Arnette Norris, MD

## 2018-06-12 ENCOUNTER — Ambulatory Visit (INDEPENDENT_AMBULATORY_CARE_PROVIDER_SITE_OTHER): Payer: Managed Care, Other (non HMO) | Admitting: Neurology

## 2018-06-12 ENCOUNTER — Encounter: Payer: Self-pay | Admitting: Neurology

## 2018-06-12 VITALS — BP 112/90 | HR 77 | Ht 67.0 in | Wt 208.0 lb

## 2018-06-12 DIAGNOSIS — G43109 Migraine with aura, not intractable, without status migrainosus: Secondary | ICD-10-CM | POA: Diagnosis not present

## 2018-06-12 MED ORDER — RIZATRIPTAN BENZOATE 10 MG PO TBDP: 10.0000 mg | ORAL_TABLET | ORAL | 3 refills | Status: DC | PRN

## 2018-06-12 NOTE — Patient Instructions (Signed)
Migraine Recommendations: 1.  We will hold off on starting a preventative medication 2.  Take Excedrin Extra-Strength at earliest onset of headache.  If headache not resolved in 1 hour, take rizatriptan 10mg .  May repeat dose once in 2 hours if needed.  Do not exceed two tablets in 24 hours. 3.  Limit use of pain relievers to no more than 2 days out of the week.  These medications include acetaminophen, ibuprofen, triptans and narcotics.  This will help reduce risk of rebound headaches. 4.  Be aware of common food triggers such as processed sweets, processed foods with nitrites (such as deli meat, hot dogs, sausages), foods with MSG, alcohol (such as wine), chocolate, certain cheeses, certain fruits (dried fruits, bananas, pineapple), vinegar, diet soda. 4.  Avoid caffeine 5.  Routine exercise 6.  Proper sleep hygiene 7.  Stay adequately hydrated with water 8.  Keep a headache diary. 9.  Maintain proper stress management. 10.  Do not skip meals. 11.  Consider supplements:  Magnesium citrate 400mg  to 600mg  daily, riboflavin 400mg , Coenzyme Q 10 100mg  three times daily 12.  Follow up in 4 months.

## 2018-07-15 ENCOUNTER — Other Ambulatory Visit: Payer: Self-pay | Admitting: Family Medicine

## 2018-07-18 ENCOUNTER — Encounter: Payer: Self-pay | Admitting: Family Medicine

## 2018-07-21 DIAGNOSIS — R198 Other specified symptoms and signs involving the digestive system and abdomen: Secondary | ICD-10-CM | POA: Insufficient documentation

## 2018-07-21 DIAGNOSIS — Z8619 Personal history of other infectious and parasitic diseases: Secondary | ICD-10-CM | POA: Insufficient documentation

## 2018-07-21 NOTE — Progress Notes (Signed)
Virtual Visit via Video   I connected with Shelby Rios on 07/21/18 at  9:20 AM EDT by a video enabled telemedicine application and verified that I am speaking with the correct person using two identifiers. Location patient: Home Location provider: [facility], Office Persons participating in the virtual visit: Shelby Rios, Ruthe Mannan, MD   I discussed the limitations of evaluation and management by telemedicine and the availability of in person appointments. The patient expressed understanding and agreed to proceed.  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 I've begun to have symptoms that are very much like when I had H-Pylori  Nausea, pain in my stomach bloating and intermittent constipation. I'm not sure what to do with the current health crisis. Do you suggest I make an appointment or is there something I can take over the counter?   Thank you-Shelby Rios   Has had H pylori twice and an endoscopy on 03/04/2016 showed H pylori was negative.   For past 1 week, she woke up with epigastric pain.  Patient states that there is a knawing pain in her stomach that is similar to what she experienced when she had H. Pylori. Pain is located in epigastric area. It startred with intermittent constipation and then progressed to explosive BM's without consistency. Tried to change diet and it did not help at all  She is very fatigued/exhausted. Denies any recent abx use.    She was taking Omeprazole 20 mg twice daily for the past 3 days without much improvement.    Tried Gaviscon but it was not quite that effective even when used as combination. She has maybe 10 Omeprazole left.  Still having bloating.  Nausea has improved.  She is working from home and this is going well.  ROS: See pertinent positives and negatives per HPI.  Review of Systems  Constitutional: Positive for fatigue.  Negative for fever.  HENT: Negative.   Respiratory: Negative.   Cardiovascular: Negative.   Gastrointestinal: Positive for abdominal distention, abdominal pain, diarrhea and nausea. Negative for anal bleeding, blood in stool, constipation, rectal pain and vomiting.  Endocrine: Negative.   Genitourinary: Negative.   Musculoskeletal: Negative.   Skin: Negative.   Allergic/Immunologic: Negative.   Hematological: Negative.   Psychiatric/Behavioral: Negative.  Negative for agitation, behavioral problems, confusion, decreased concentration, dysphoric mood, hallucinations, self-injury, sleep disturbance and suicidal ideas. The patient is not nervous/anxious and is not hyperactive.   All other systems reviewed and are negative.    Patient Active Problem List   Diagnosis Date Noted  . History of Helicobacter pylori infection 07/21/2018  . GI symptoms 07/21/2018  . History of hysterectomy 10/02/2016  . Night sweats 06/24/2016  . OSA (obstructive sleep apnea) 11/06/2015  . Vitamin D deficiency 11/02/2014  . Vitamin B12 deficiency 11/02/2014  . Other sleep disturbances 10/27/2013  . PALPITATIONS 08/09/2009  . Obesity (BMI 30-39.9) 01/19/2009  . NONSPECIFIC ABNORM RESULTS OTH SPEC FUNCT STUDY 01/19/2009  . DYSPEPSIA 05/02/2008  . DEPRESSION/ANXIETY 06/03/2007  . DEPENDENT EDEMA 02/19/2007    Social History   Tobacco Use  . Smoking status: Never Smoker  . Smokeless tobacco: Never Used  . Tobacco comment: nonsmoker  Substance Use Topics  . Alcohol use: No    Alcohol/week: 0.0 standard drinks    Current Outpatient Medications:  .  ALPRAZolam (XANAX) 0.25 MG tablet, Take 1 tablet (0.25 mg total) by mouth  2 (two) times daily as needed for anxiety or sleep., Disp: 60 tablet, Rfl: 0 .  aspirin-acetaminophen-caffeine (EXCEDRIN EXTRA STRENGTH) 250-250-65 MG tablet, Take 1 tablet by mouth every 8 (eight) hours as needed for headache., Disp: 30 tablet, Rfl: 0 .  Cholecalciferol (VITAMIN D3)  5000 units TABS, Take 1 tablet by mouth daily., Disp: , Rfl:  .  fexofenadine (ALLEGRA) 180 MG tablet, Take 1 tablet (180 mg total) by mouth daily., Disp: , Rfl:  .  fluticasone (FLONASE) 50 MCG/ACT nasal spray, SPRAY 2 SPRAYS INTO EACH NOSTRIL EVERY DAY, Disp: 16 g, Rfl: 0 .  ibuprofen (ADVIL,MOTRIN) 200 MG tablet, Take 200 mg by mouth every 6 (six) hours as needed. UAD , Disp: , Rfl:  .  omeprazole (PRILOSEC) 20 MG capsule, Take 1 capsule (20 mg total) by mouth 2 (two) times daily before a meal., Disp: 28 capsule, Rfl: 3 .  rizatriptan (MAXALT-MLT) 10 MG disintegrating tablet, Take 1 tablet (10 mg total) by mouth as needed for migraine. May repeat in 2 hours if needed.  Maximum 2tabs/24 hours, Disp: 9 tablet, Rfl: 3 .  sertraline (ZOLOFT) 50 MG tablet, TAKE 1 TABLET BY MOUTH  DAILY, Disp: 90 tablet, Rfl: 1 .  Vitamin D, Cholecalciferol, 1000 units CAPS, Take by mouth., Disp: , Rfl:   No Known Allergies  Objective:  Temp 98.4 F (36.9 C) (Oral)   Ht 5\' 7"  (1.702 m)   Wt 200 lb (90.7 kg)   LMP 07/22/2006   BMI 31.32 kg/m   Wt Readings from Last 3 Encounters:  07/22/18 200 lb (90.7 kg)  06/12/18 208 lb (94.3 kg)  01/13/18 200 lb (90.7 kg)    VITALS: Per patient if applicable, see vitals. GENERAL: Alert, appears well and in no acute distress. HEENT: Atraumatic, conjunctiva clear, no obvious abnormalities on inspection of external nose and ears. NECK: Normal movements of the head and neck. CARDIOPULMONARY: No increased WOB. Speaking in clear sentences. I:E ratio WNL.  MS: Moves all visible extremities without noticeable abnormality. Abd: mild TTP over epigastrium, otherwise unremarkable PSYCH: Pleasant and cooperative, well-groomed. Speech normal rate and rhythm. Affect is appropriate. Insight and judgement are appropriate. Attention is focused, linear, and appropriate.  NEURO: CN grossly intact. Oriented as arrived to appointment on time with no prompting. Moves both UE equally.   SKIN: No obvious lesions, wounds, erythema, or cyanosis noted on face or hands.  Assessment and Plan:   Diagnoses and all orders for this visit:  History of Helicobacter pylori infection  GI symptoms    . Reviewed expectations re: course of current medical issues. . Discussed self-management of symptoms. . Outlined signs and symptoms indicating need for more acute intervention. . Patient verbalized understanding and all questions were answered. Marland Kitchen Health Maintenance issues including appropriate healthy diet, exercise, and smoking avoidance were discussed with patient. . See orders for this visit as documented in the electronic medical record.  Ruthe Mannan, MD 07/21/2018

## 2018-07-22 ENCOUNTER — Ambulatory Visit (INDEPENDENT_AMBULATORY_CARE_PROVIDER_SITE_OTHER): Payer: Managed Care, Other (non HMO) | Admitting: Family Medicine

## 2018-07-22 ENCOUNTER — Other Ambulatory Visit: Payer: Self-pay | Admitting: Family Medicine

## 2018-07-22 ENCOUNTER — Encounter: Payer: Self-pay | Admitting: Family Medicine

## 2018-07-22 VITALS — Temp 98.4°F | Ht 67.0 in | Wt 200.0 lb

## 2018-07-22 DIAGNOSIS — Z8619 Personal history of other infectious and parasitic diseases: Secondary | ICD-10-CM

## 2018-07-22 DIAGNOSIS — R198 Other specified symptoms and signs involving the digestive system and abdomen: Secondary | ICD-10-CM

## 2018-07-22 MED ORDER — PANTOPRAZOLE SODIUM 40 MG PO TBEC: 40.0000 mg | DELAYED_RELEASE_TABLET | Freq: Two times a day (BID) | ORAL | 0 refills | Status: DC

## 2018-07-22 MED ORDER — OMEPRAZOLE 20 MG PO CPDR: 20.0000 mg | DELAYED_RELEASE_CAPSULE | Freq: Two times a day (BID) | ORAL | 3 refills | Status: DC

## 2018-07-22 NOTE — Assessment & Plan Note (Signed)
With symptoms very similar to H pylori infection.  Works for [employer]- site by her home.  I have ordered h pylori stool antigen along with other labs. D/c omeprazole. Start protonix 40 mg twice daily x 2 weeks. Call or send my chart message prn if these symptoms worsen or fail to improve as anticipated. Update me in 2 weeks as well. The patient indicates understanding of these issues and agrees with the plan.

## 2018-07-23 ENCOUNTER — Encounter: Payer: Self-pay | Admitting: Family Medicine

## 2018-07-23 ENCOUNTER — Other Ambulatory Visit: Payer: Self-pay | Admitting: Family Medicine

## 2018-07-23 LAB — COMPREHENSIVE METABOLIC PANEL WITH GFR
ALT: 13 [IU]/L (ref 0–32)
AST: 14 [IU]/L (ref 0–40)
Albumin/Globulin Ratio: 1.6 (ref 1.2–2.2)
Albumin: 4.1 g/dL (ref 3.8–4.8)
Alkaline Phosphatase: 73 [IU]/L (ref 39–117)
BUN/Creatinine Ratio: 16 (ref 9–23)
BUN: 10 mg/dL (ref 6–24)
Bilirubin Total: 0.2 mg/dL (ref 0.0–1.2)
CO2: 25 mmol/L (ref 20–29)
Calcium: 9.1 mg/dL (ref 8.7–10.2)
Chloride: 98 mmol/L (ref 96–106)
Creatinine, Ser: 0.63 mg/dL (ref 0.57–1.00)
GFR calc Af Amer: 128 mL/min/{1.73_m2}
GFR calc non Af Amer: 111 mL/min/{1.73_m2}
Globulin, Total: 2.6 g/dL (ref 1.5–4.5)
Glucose: 87 mg/dL (ref 65–99)
Potassium: 4.1 mmol/L (ref 3.5–5.2)
Sodium: 137 mmol/L (ref 134–144)
Total Protein: 6.7 g/dL (ref 6.0–8.5)

## 2018-07-23 LAB — CBC WITH DIFFERENTIAL/PLATELET
Basophils Absolute: 0 10*3/uL (ref 0.0–0.2)
Basos: 1 %
EOS (ABSOLUTE): 0.2 10*3/uL (ref 0.0–0.4)
Eos: 3 %
Hematocrit: 38.8 % (ref 34.0–46.6)
Hemoglobin: 12.6 g/dL (ref 11.1–15.9)
Immature Grans (Abs): 0 10*3/uL (ref 0.0–0.1)
Immature Granulocytes: 0 %
Lymphocytes Absolute: 2.2 10*3/uL (ref 0.7–3.1)
Lymphs: 33 %
MCH: 28 pg (ref 26.6–33.0)
MCHC: 32.5 g/dL (ref 31.5–35.7)
MCV: 86 fL (ref 79–97)
Monocytes Absolute: 0.6 10*3/uL (ref 0.1–0.9)
Monocytes: 9 %
Neutrophils Absolute: 3.5 10*3/uL (ref 1.4–7.0)
Neutrophils: 54 %
Platelets: 290 10*3/uL (ref 150–450)
RBC: 4.5 x10E6/uL (ref 3.77–5.28)
RDW: 12.2 % (ref 11.7–15.4)
WBC: 6.5 10*3/uL (ref 3.4–10.8)

## 2018-07-23 LAB — LIPASE: Lipase: 23 U/L (ref 14–72)

## 2018-07-23 MED ORDER — PANTOPRAZOLE SODIUM 40 MG PO TBEC: 40.0000 mg | DELAYED_RELEASE_TABLET | Freq: Two times a day (BID) | ORAL | 0 refills | Status: DC

## 2018-08-03 ENCOUNTER — Encounter: Payer: Self-pay | Admitting: Family Medicine

## 2018-08-03 NOTE — Progress Notes (Signed)
H.Pylori stool test: Negative/thx dmf

## 2018-09-10 ENCOUNTER — Telehealth: Payer: Self-pay | Admitting: Family Medicine

## 2018-09-10 NOTE — Telephone Encounter (Signed)
Called pt about message she sent in Appointment Request From: [name]    With Provider: Ruthe Mannan, MD [[facility]]    Preferred Date Range: 11/16/2018 - 11/28/2018    Preferred Times: Any time    Reason for visit: Annual Physical    Comments:  Annual physical    Left message

## 2018-10-19 ENCOUNTER — Ambulatory Visit: Payer: Managed Care, Other (non HMO) | Admitting: Neurology

## 2018-11-09 ENCOUNTER — Other Ambulatory Visit: Payer: Self-pay

## 2018-11-09 ENCOUNTER — Encounter: Payer: Self-pay | Admitting: Family Medicine

## 2018-11-09 DIAGNOSIS — E785 Hyperlipidemia, unspecified: Secondary | ICD-10-CM

## 2018-11-09 DIAGNOSIS — E538 Deficiency of other specified B group vitamins: Secondary | ICD-10-CM

## 2018-11-09 DIAGNOSIS — Z0000 Encounter for general adult medical examination without abnormal findings: Secondary | ICD-10-CM

## 2018-11-09 DIAGNOSIS — E559 Vitamin D deficiency, unspecified: Secondary | ICD-10-CM

## 2018-11-09 DIAGNOSIS — F341 Dysthymic disorder: Secondary | ICD-10-CM

## 2018-11-09 DIAGNOSIS — R198 Other specified symptoms and signs involving the digestive system and abdomen: Secondary | ICD-10-CM

## 2018-11-11 ENCOUNTER — Other Ambulatory Visit: Payer: Self-pay | Admitting: Family Medicine

## 2018-11-12 LAB — COMPREHENSIVE METABOLIC PANEL
ALT: 15 IU/L (ref 0–32)
AST: 17 IU/L (ref 0–40)
Albumin/Globulin Ratio: 1.7 (ref 1.2–2.2)
Albumin: 4.4 g/dL (ref 3.8–4.8)
Alkaline Phosphatase: 69 IU/L (ref 39–117)
BUN/Creatinine Ratio: 23 (ref 9–23)
BUN: 16 mg/dL (ref 6–24)
Bilirubin Total: 0.2 mg/dL (ref 0.0–1.2)
CO2: 26 mmol/L (ref 20–29)
Calcium: 9.5 mg/dL (ref 8.7–10.2)
Chloride: 99 mmol/L (ref 96–106)
Creatinine, Ser: 0.71 mg/dL (ref 0.57–1.00)
GFR calc Af Amer: 121 mL/min/{1.73_m2} (ref >59)
GFR calc non Af Amer: 105 mL/min/{1.73_m2} (ref >59)
Globulin, Total: 2.6 g/dL (ref 1.5–4.5)
Glucose: 89 mg/dL (ref 65–99)
Potassium: 4.2 mmol/L (ref 3.5–5.2)
Sodium: 138 mmol/L (ref 134–144)
Total Protein: 7 g/dL (ref 6.0–8.5)

## 2018-11-12 LAB — LIPID PANEL
Chol/HDL Ratio: 3.8 ratio (ref 0.0–4.4)
Cholesterol, Total: 185 mg/dL (ref 100–199)
HDL: 49 mg/dL (ref >39)
LDL Calculated: 125 mg/dL — ABNORMAL HIGH (ref 0–99)
Triglycerides: 57 mg/dL (ref 0–149)
VLDL Cholesterol Cal: 11 mg/dL (ref 5–40)

## 2018-11-12 LAB — CBC WITH DIFFERENTIAL/PLATELET
Basophils Absolute: 0 10*3/uL (ref 0.0–0.2)
Basos: 1 %
EOS (ABSOLUTE): 0.4 10*3/uL (ref 0.0–0.4)
Eos: 5 %
Hematocrit: 38.6 % (ref 34.0–46.6)
Hemoglobin: 12.7 g/dL (ref 11.1–15.9)
Immature Grans (Abs): 0 10*3/uL (ref 0.0–0.1)
Immature Granulocytes: 0 %
Lymphocytes Absolute: 2.1 10*3/uL (ref 0.7–3.1)
Lymphs: 32 %
MCH: 27.8 pg (ref 26.6–33.0)
MCHC: 32.9 g/dL (ref 31.5–35.7)
MCV: 85 fL (ref 79–97)
Monocytes Absolute: 0.7 10*3/uL (ref 0.1–0.9)
Monocytes: 11 %
Neutrophils Absolute: 3.5 10*3/uL (ref 1.4–7.0)
Neutrophils: 51 %
Platelets: 310 10*3/uL (ref 150–450)
RBC: 4.57 x10E6/uL (ref 3.77–5.28)
RDW: 12.9 % (ref 11.7–15.4)
WBC: 6.8 10*3/uL (ref 3.4–10.8)

## 2018-11-12 LAB — TSH: TSH: 1.25 u[IU]/mL (ref 0.450–4.500)

## 2018-11-12 LAB — VITAMIN D 25 HYDROXY (VIT D DEFICIENCY, FRACTURES): Vit D, 25-Hydroxy: 32.9 ng/mL (ref 30.0–100.0)

## 2018-11-12 LAB — VITAMIN B12: Vitamin B-12: 527 pg/mL (ref 232–1245)

## 2018-11-12 LAB — SARS-COV-2 ANTIBODY, IGM: SARS-CoV-2 Antibody, IgM: NEGATIVE

## 2018-11-25 ENCOUNTER — Encounter: Payer: Managed Care, Other (non HMO) | Admitting: Family Medicine

## 2018-12-20 DIAGNOSIS — K219 Gastro-esophageal reflux disease without esophagitis: Secondary | ICD-10-CM | POA: Insufficient documentation

## 2018-12-20 NOTE — Patient Instructions (Addendum)
Great to see you today, Shelby Rios! Congratulations on your new house!!!!  Please call [facility] to schedule your mammogram at your convenience. [phone]  :)   The 10-year ASCVD risk score is: 0.6%   Values used to calculate the score:     Age: 43 years     Sex: Female     Is Non-Hispanic African American: No     Diabetic: No     Tobacco smoker: No     Systolic Blood Pressure: 932 mmHg     Is BP treated: No     HDL Cholesterol: 49 mg/dL     Total Cholesterol: 185 mg/dL  Start taking 2000 - 4000 IU of vitamin D daily.  Please call your dermatologist to schedule a follow up.

## 2018-12-20 NOTE — Progress Notes (Signed)
Subjective:   Patient ID: Shelby Rios, female    DOB: [birth year], 43 y.o.   MRN: [id number]  Shelby Rios is a pleasant 43 y.o. year old female who presents to clinic today with Annual Exam (Pt screened at vehicle. She is here today for a CPE without PAP.  Mammogram order for [place]. )  on 12/21/2018  HPI:  CPX and follow up of chronic conditions-  Doing okay.  Working from home.  Just closed on a new house today. Health Maintenance  Topic Date Due  . TETANUS/TDAP  11/13/2027  . INFLUENZA VACCINE  Completed  . HIV Screening  Completed  Due for mammogram. Remote h/o hysterectomy.  Does go to a dermatologist year.  Wt Readings from Last 3 Encounters:  12/21/18 198 lb 6.4 oz (90 kg)  07/22/18 200 lb (90.7 kg)  06/12/18 208 lb (94.3 kg)   Last year when I saw her, she had been going to boot camp and had lost 15 pounds.  She has lost a few more pounds. Wt Readings from Last 3 Encounters:  12/21/18 198 lb 6.4 oz (90 kg)  07/22/18 200 lb (90.7 kg)  06/12/18 208 lb (94.3 kg)     Anxiety and depression- taking zoloft 50 mg daily.  Symptoms are well controlled.  Has not taken xanax in a year.  Depression screen [facility] 2/9 12/21/2018 11/12/2017 11/06/2016 11/06/2015 03/03/2015  Decreased Interest 0 0 0 0 1  Down, Depressed, Hopeless 0 0 0 0 0  PHQ - 2 Score 0 0 0 0 1  Altered sleeping 0 - - - 3  Tired, decreased energy 1 - - - 1  Change in appetite 0 - - - 0  Feeling bad or failure about yourself  0 - - - 0  Trouble concentrating 1 - - - 1  Moving slowly or fidgety/restless 0 - - - 0  Suicidal thoughts 0 - - - 0  PHQ-9 Score 2 - - - 6  Difficult doing work/chores Not difficult at all - - - Not difficult at all   GAD 7 : Generalized Anxiety Score 12/21/2018 03/03/2015  Nervous, Anxious, on Edge 1 2  Control/stop worrying 0 1  Worry too much - different things 0 2  Trouble relaxing 1 2  Restless 0 1  Easily annoyed or irritable 1 0  Afraid - awful might happen 0 1  Total GAD 7  Score 3 9  Anxiety Difficulty Not difficult at all Somewhat difficult    Vitamin D deficiency- last year, Vit D was 22.4 so we repleted with high dose Vit D 50000 IU weekly for 8 week sand then advised 1600 IU daily there after.  This year, Vit D has improved to 32.9.    Vit B12 def- B12 was 463 last year, alos improved this year. Lab Results  Component Value Date   QJJHERDE08 144 11/11/2018   Lab Results  Component Value Date   CHOL 185 11/11/2018   HDL 49 11/11/2018   LDLCALC 125 (H) 11/11/2018   TRIG 57 11/11/2018   CHOLHDL 3.8 11/11/2018   The 10-year ASCVD risk score Mikey Bussing DC Jr., et al., 2013) is: 0.6%   Values used to calculate the score:     Age: 43 years     Sex: Female     Is Non-Hispanic African American: No     Diabetic: No     Tobacco smoker: No     Systolic Blood Pressure: 818 mmHg  Is BP treated: No     HDL Cholesterol: 49 mg/dL     Total Cholesterol: 185 mg/dL  Migraines well controlled with as needed maxalt.  Rarely uses it.  Lab Results  Component Value Date   WBC 6.8 11/11/2018   HGB 12.7 11/11/2018   HCT 38.6 11/11/2018   MCV 85 11/11/2018   PLT 310 11/11/2018   Lab Results  Component Value Date   TSH 1.250 11/11/2018   Lab Results  Component Value Date   CREATININE 0.71 11/11/2018   BUN 16 11/11/2018   NA 138 11/11/2018   K 4.2 11/11/2018   CL 99 11/11/2018   CO2 26 11/11/2018   Lab Results  Component Value Date   ALT 15 11/11/2018   AST 17 11/11/2018   ALKPHOS 69 11/11/2018   BILITOT 0.2 11/11/2018        Current Outpatient Medications on File Prior to Visit  Medication Sig Dispense Refill  . ALPRAZolam (XANAX) 0.25 MG tablet Take 1 tablet (0.25 mg total) by mouth 2 (two) times daily as needed for anxiety or sleep. 60 tablet 0  . aspirin-acetaminophen-caffeine (EXCEDRIN EXTRA STRENGTH) 250-250-65 MG tablet Take 1 tablet by mouth every 8 (eight) hours as needed for headache. 30 tablet 0  . fexofenadine (ALLEGRA) 180 MG  tablet Take 1 tablet (180 mg total) by mouth daily.    . fluticasone (FLONASE) 50 MCG/ACT nasal spray SPRAY 2 SPRAYS INTO EACH NOSTRIL EVERY DAY 16 g 0  . ibuprofen (ADVIL,MOTRIN) 200 MG tablet Take 200 mg by mouth every 6 (six) hours as needed. UAD     . pantoprazole (PROTONIX) 40 MG tablet Take 1 tablet (40 mg total) by mouth 2 (two) times daily before a meal. 14 tablet 0  . rizatriptan (MAXALT-MLT) 10 MG disintegrating tablet Take 1 tablet (10 mg total) by mouth as needed for migraine. May repeat in 2 hours if needed.  Maximum 2tabs/24 hours 9 tablet 3  . sertraline (ZOLOFT) 50 MG tablet TAKE 1 TABLET BY MOUTH  DAILY 90 tablet 1  . Vitamin D, Cholecalciferol, 1000 units CAPS Take by mouth.     No current facility-administered medications on file prior to visit.     No Known Allergies  Past Medical History:  Diagnosis Date  . Abnormal mammogram, unspecified   . Abnormal weight gain   . Allergy   . Anemia   . Anxiety   . Back pain   . Breast pain, right   . Depression   . Dyspepsia   . Edema   . Fatigue   . GERD (gastroesophageal reflux disease)   . Hypertension    only during pregnancy  . Nonspecific abnormal results of other specified function study   . Palpitations   . Routine general medical examination at a health care facility   . Seizures ([place])    couple seisure around age 54- tx with dilantin for a period then d/c  . Sleep apnea    c-pap wears    Past Surgical History:  Procedure Laterality Date  . adenoidectomy    . BRCA test     neg  . DILATION AND CURETTAGE OF UTERUS    . PARTIAL HYSTERECTOMY  2008   endometriosis  . TONSILLECTOMY    . WISDOM TOOTH EXTRACTION      Family History  Problem Relation Age of Onset  . Hypokalemia Sister        deceased at 70; was getting regular IV K infusions; long Hx  of pseudotumor cerebri  . Heart disease Father        CABG age 80  . Diabetes Father   . Ovarian cancer Mother   . Stroke Mother 47  . Cancer Mother         ovarian  . Diabetes Mother   . Seizures Mother   . Heart attack Other        GF - 35  . Coronary artery disease Other        family hx  . Hypertension Other        family hx  . Diabetes Other        family hx  . Breast cancer Maternal Grandmother   . Colon cancer Neg Hx   . Esophageal cancer Neg Hx   . Pancreatic cancer Neg Hx   . Rectal cancer Neg Hx   . Stomach cancer Neg Hx     Social History   Socioeconomic History  . Marital status: Married    Spouse name: Maudie Mercury  . Number of children: 2  . Years of education: Not on file  . Highest education level: Bachelor's degree (e.g., BA, AB, BS)  Occupational History  . Occupation: Primary school teacher: [employer]  . Financial resource strain: Not on file  . Food insecurity    Worry: Not on file    Inability: Not on file  . Transportation needs    Medical: Not on file    Non-medical: Not on file  Tobacco Use  . Smoking status: Never Smoker  . Smokeless tobacco: Never Used  . Tobacco comment: nonsmoker  Substance and Sexual Activity  . Alcohol use: No    Alcohol/week: 0.0 standard drinks  . Drug use: No  . Sexual activity: Not on file  Lifestyle  . Physical activity    Days per week: Not on file    Minutes per session: Not on file  . Stress: Not on file  Relationships  . Social Herbalist on phone: Not on file    Gets together: Not on file    Attends religious service: Not on file    Active member of club or organization: Not on file    Attends meetings of clubs or organizations: Not on file    Relationship status: Not on file  . Intimate partner violence    Fear of current or ex partner: Not on file    Emotionally abused: Not on file    Physically abused: Not on file    Forced sexual activity: Not on file  Other Topics Concern  . Not on file  Social History Narrative   Divorced since 9/08; 2 children   3 coffees/day       Patient is right-handed. She lives with her  husband in a one level home. She drinks 4-6 cups of coffee a day, and 1-2 sodas a week. She participates in boot camp 3 x week and hot yoga 3 x a week, each for 1 hour.   The PMH, PSH, Social History, Family History, Medications, and allergies have been reviewed in [facility], and have been updated if relevant.   Review of Systems  Constitutional: Positive for fatigue.  HENT: Negative.   Respiratory: Negative.   Cardiovascular: Negative.   Gastrointestinal: Negative.   Endocrine: Negative.   Genitourinary: Negative.   Musculoskeletal: Negative.   Allergic/Immunologic: Negative.   Neurological: Negative.   Hematological: Negative.   Psychiatric/Behavioral: Negative.  All other systems reviewed and are negative.      Objective:    BP 116/70 (BP Location: Left Arm, Patient Position: Sitting, Cuff Size: Normal)   Pulse 76   Temp 98.5 F (36.9 C) (Oral)   Ht _0  (1.676 m)   Wt 198 lb 6.4 oz (90 kg)   LMP 07/22/2006   SpO2 99%   BMI 32.02 kg/m   Wt Readings from Last 3 Encounters:  12/21/18 198 lb 6.4 oz (90 kg)  07/22/18 200 lb (90.7 kg)  06/12/18 208 lb (94.3 kg)    Physical Exam   General:  Well-developed,well-nourished,in no acute distress; alert,appropriate and cooperative throughout examination Head:  normocephalic and atraumatic.   Eyes:  vision grossly intact, PERRL Ears:  R ear normal and L ear normal externally, TMs clear bilaterally Nose:  no external deformity.   Mouth:  good dentition.   Neck:  No deformities, masses, or tenderness noted. Breasts:  No mass, nodules, thickening, tenderness, bulging, retraction, inflamation, nipple discharge or skin changes noted.   Lungs:  Normal respiratory effort, chest expands symmetrically. Lungs are clear to auscultation, no crackles or wheezes. Heart:  Normal rate and regular rhythm. S1 and S2 normal without gallop, murmur, click, rub or other extra sounds. Abdomen:  Bowel sounds positive,abdomen soft and non-tender  without masses, organomegaly or hernias noted. Msk:  No deformity or scoliosis noted of thoracic or lumbar spine.   Extremities:  No clubbing, cyanosis, edema, or deformity noted with normal full range of motion of all joints.   Neurologic:  alert & oriented X3 and gait normal.   Skin:  Intact without suspicious lesions or rashes Cervical Nodes:  No lymphadenopathy noted Axillary Nodes:  No palpable lymphadenopathy Psych:  Cognition and judgment appear intact. Alert and cooperative with normal attention span and concentration. No apparent delusions, illusions, hallucinations       Assessment & Plan:   Well woman exam without gynecological exam  Vitamin B12 deficiency  Vitamin D deficiency  DEPRESSION/ANXIETY  History of hysterectomy  Chronic GERD  Screening for breast cancer - Plan: MM 3D SCREEN BREAST BILATERAL  OSA (obstructive sleep apnea)  Need for influenza vaccination - Plan: Flu Vaccine QUAD 6+ mos PF IM (Fluarix Quad PF) No follow-ups on file.

## 2018-12-21 ENCOUNTER — Other Ambulatory Visit: Payer: Self-pay

## 2018-12-21 ENCOUNTER — Ambulatory Visit (INDEPENDENT_AMBULATORY_CARE_PROVIDER_SITE_OTHER): Payer: Managed Care, Other (non HMO) | Admitting: Family Medicine

## 2018-12-21 ENCOUNTER — Encounter: Payer: Self-pay | Admitting: Family Medicine

## 2018-12-21 VITALS — BP 116/70 | HR 76 | Temp 98.5°F | Ht 66.0 in | Wt 198.4 lb

## 2018-12-21 DIAGNOSIS — E538 Deficiency of other specified B group vitamins: Secondary | ICD-10-CM | POA: Diagnosis not present

## 2018-12-21 DIAGNOSIS — Z23 Encounter for immunization: Secondary | ICD-10-CM

## 2018-12-21 DIAGNOSIS — Z90710 Acquired absence of both cervix and uterus: Secondary | ICD-10-CM

## 2018-12-21 DIAGNOSIS — Z0000 Encounter for general adult medical examination without abnormal findings: Secondary | ICD-10-CM

## 2018-12-21 DIAGNOSIS — Z1239 Encounter for other screening for malignant neoplasm of breast: Secondary | ICD-10-CM

## 2018-12-21 DIAGNOSIS — E669 Obesity, unspecified: Secondary | ICD-10-CM

## 2018-12-21 DIAGNOSIS — F341 Dysthymic disorder: Secondary | ICD-10-CM

## 2018-12-21 DIAGNOSIS — K219 Gastro-esophageal reflux disease without esophagitis: Secondary | ICD-10-CM

## 2018-12-21 DIAGNOSIS — G43909 Migraine, unspecified, not intractable, without status migrainosus: Secondary | ICD-10-CM | POA: Insufficient documentation

## 2018-12-21 DIAGNOSIS — G43009 Migraine without aura, not intractable, without status migrainosus: Secondary | ICD-10-CM

## 2018-12-21 DIAGNOSIS — G4733 Obstructive sleep apnea (adult) (pediatric): Secondary | ICD-10-CM

## 2018-12-21 DIAGNOSIS — E559 Vitamin D deficiency, unspecified: Secondary | ICD-10-CM

## 2018-12-21 MED ORDER — ALPRAZOLAM 0.25 MG PO TABS: 0.2500 mg | ORAL_TABLET | Freq: Two times a day (BID) | ORAL | 0 refills | Status: DC | PRN

## 2018-12-21 NOTE — Assessment & Plan Note (Signed)
She is still feeling a little tired.  Advised to increase Vit D to 2000- 4000 IU daily.

## 2018-12-21 NOTE — Assessment & Plan Note (Signed)
Congratulated her on her continued success.  She is still exercising and working on diet.

## 2018-12-21 NOTE — Assessment & Plan Note (Signed)
Well controlled with maxalt as abortive therapy.

## 2018-12-21 NOTE — Assessment & Plan Note (Signed)
Improved. No changes made. 

## 2018-12-21 NOTE — Assessment & Plan Note (Signed)
Well controlled on current of zoloft. GAD 7 : Generalized Anxiety Score 12/21/2018 03/03/2015  Nervous, Anxious, on Edge 1 2  Control/stop worrying 0 1  Worry too much - different things 0 2  Trouble relaxing 1 2  Restless 0 1  Easily annoyed or irritable 1 0  Afraid - awful might happen 0 1  Total GAD 7 Score 3 9  Anxiety Difficulty Not difficult at all Somewhat difficult    Depression screen [facility] 2/9 12/21/2018 11/12/2017 11/06/2016 11/06/2015 03/03/2015  Decreased Interest 0 0 0 0 1  Down, Depressed, Hopeless 0 0 0 0 0  PHQ - 2 Score 0 0 0 0 1  Altered sleeping 0 - - - 3  Tired, decreased energy 1 - - - 1  Change in appetite 0 - - - 0  Feeling bad or failure about yourself  0 - - - 0  Trouble concentrating 1 - - - 1  Moving slowly or fidgety/restless 0 - - - 0  Suicidal thoughts 0 - - - 0  PHQ-9 Score 2 - - - 6  Difficult doing work/chores Not difficult at all - - - Not difficult at all

## 2018-12-21 NOTE — Assessment & Plan Note (Addendum)
Reviewed preventive care protocols, scheduled due services, and updated immunizations Discussed nutrition, exercise, diet, and healthy lifestyle.  Mammogram ordered and phone number for [facility] given to pt to schedule her own mammogram.  Influenza vaccine given to pt today.

## 2019-01-01 ENCOUNTER — Other Ambulatory Visit: Payer: Self-pay | Admitting: Family Medicine

## 2019-01-15 ENCOUNTER — Ambulatory Visit
Admission: RE | Admit: 2019-01-15 | Discharge: 2019-01-15 | Disposition: A | Discharge status: Home / Self Care | Payer: Managed Care, Other (non HMO) | Source: Ambulatory Visit | Attending: Family Medicine | Admitting: Family Medicine

## 2019-01-15 ENCOUNTER — Other Ambulatory Visit: Payer: Self-pay | Admitting: Neurology

## 2019-01-15 DIAGNOSIS — Z1239 Encounter for other screening for malignant neoplasm of breast: Secondary | ICD-10-CM

## 2019-01-15 DIAGNOSIS — Z1231 Encounter for screening mammogram for malignant neoplasm of breast: Secondary | ICD-10-CM | POA: Insufficient documentation

## 2019-01-20 ENCOUNTER — Ambulatory Visit (INDEPENDENT_AMBULATORY_CARE_PROVIDER_SITE_OTHER): Payer: Managed Care, Other (non HMO) | Admitting: Nurse Practitioner

## 2019-01-20 ENCOUNTER — Other Ambulatory Visit: Payer: Self-pay

## 2019-01-20 ENCOUNTER — Ambulatory Visit (HOSPITAL_COMMUNITY)
Admission: RE | Admit: 2019-01-20 | Discharge: 2019-01-20 | Disposition: A | Discharge status: Home / Self Care | Payer: Managed Care, Other (non HMO) | Source: Ambulatory Visit | Attending: Nurse Practitioner | Admitting: Nurse Practitioner

## 2019-01-20 ENCOUNTER — Encounter: Payer: Self-pay | Admitting: Nurse Practitioner

## 2019-01-20 VITALS — BP 120/78 | HR 76 | Temp 97.9°F | Ht 66.0 in | Wt 199.6 lb

## 2019-01-20 DIAGNOSIS — M79605 Pain in left leg: Secondary | ICD-10-CM

## 2019-01-20 MED ORDER — KETOROLAC TROMETHAMINE 30 MG/ML IJ SOLN
30.0000 mg | Freq: Once | INTRAMUSCULAR | Status: AC
  Administered 2019-01-20: 30 mg via INTRAMUSCULAR

## 2019-01-20 NOTE — Patient Instructions (Addendum)
Go for venous doppler at 3:45pm today [address], [place], [place] [address].  Negative for DVT. She is to use knee brace and will be scheduled with sports medicine for additional exam.

## 2019-01-20 NOTE — Progress Notes (Signed)
Subjective:  Patient ID: Shelby Rios, female    DOB: 1975-07-21  Age: 43 y.o. MRN: [id number]  CC: Knee Pain (pt is left knee painful tove going up the thigh/going on one day and half/hot and cold,ibuprofen/)  Knee Pain  The incident occurred 12 to 24 hours ago. There was no injury mechanism. The pain is present in the left leg and left thigh. The quality of the pain is described as aching. The pain is at a severity of 10/10. The pain is severe. The pain has been constant since onset. Pertinent negatives include no inability to bear weight, loss of sensation, muscle weakness, numbness or tingling. She reports no foreign bodies present. The symptoms are aggravated by movement, palpation and weight bearing. She has tried NSAIDs, ice, heat, rest and elevation for the symptoms. The treatment provided no relief.  no use of hormonal contraception No hx of DVT or PE No CP or SOB No procedure requiring immobilization and no travel.  Reviewed past Medical, Social and Family history today.  Outpatient Medications Prior to Visit  Medication Sig Dispense Refill  . ALPRAZolam (XANAX) 0.25 MG tablet Take 1 tablet (0.25 mg total) by mouth 2 (two) times daily as needed for anxiety or sleep. 60 tablet 0  . aspirin-acetaminophen-caffeine (EXCEDRIN EXTRA STRENGTH) 250-250-65 MG tablet Take 1 tablet by mouth every 8 (eight) hours as needed for headache. 30 tablet 0  . fexofenadine (ALLEGRA) 180 MG tablet Take 1 tablet (180 mg total) by mouth daily.    . fluticasone (FLONASE) 50 MCG/ACT nasal spray SPRAY 2 SPRAYS INTO EACH NOSTRIL EVERY DAY 16 g 0  . ibuprofen (ADVIL,MOTRIN) 200 MG tablet Take 200 mg by mouth every 6 (six) hours as needed. UAD     . pantoprazole (PROTONIX) 40 MG tablet Take 1 tablet (40 mg total) by mouth 2 (two) times daily before a meal. 14 tablet 0  . rizatriptan (MAXALT-MLT) 10 MG disintegrating tablet TAKE 1 TABLET BY MOUTH AS NEEDED FOR MIGRAINE. MAY REPEAT IN 2HRS IF NEEDED. MAX 2  TABS/24HRS 9 tablet 3  . sertraline (ZOLOFT) 50 MG tablet TAKE 1 TABLET BY MOUTH  DAILY 90 tablet 3  . Vitamin D, Cholecalciferol, 1000 units CAPS Take by mouth.     No facility-administered medications prior to visit.     ROS See HPI  Objective:  BP 120/78   Pulse 76   Temp 97.9 F (36.6 C) (Tympanic)   Ht 5\' 6"  (1.676 m)   Wt 199 lb 9.6 oz (90.5 kg)   LMP 07/22/2006   SpO2 98%   BMI 32.22 kg/m   BP Readings from Last 3 Encounters:  01/20/19 120/78  12/21/18 116/70  06/12/18 112/90    Wt Readings from Last 3 Encounters:  01/20/19 199 lb 9.6 oz (90.5 kg)  12/21/18 198 lb 6.4 oz (90 kg)  07/22/18 200 lb (90.7 kg)    Physical Exam Vitals signs reviewed.  Cardiovascular:     Rate and Rhythm: Normal rate.     Pulses: Normal pulses.  Pulmonary:     Effort: Pulmonary effort is normal.  Musculoskeletal:        General: Tenderness present.     Right knee: Normal.     Left knee: She exhibits decreased range of motion and swelling. She exhibits no effusion, no laceration, no erythema, normal patellar mobility and no bony tenderness. Tenderness found. Medial joint line tenderness noted. No lateral joint line, no MCL, no LCL and no patellar tendon tenderness  noted.     Left ankle: Normal.     Left upper leg: She exhibits tenderness and swelling. She exhibits no bony tenderness.     Left lower leg: Normal.       Legs:     Left foot: Normal.  Skin:    Findings: No erythema.  Neurological:     Mental Status: She is alert and oriented to person, place, and time.     Lab Results  Component Value Date   WBC 6.8 11/11/2018   HGB 12.7 11/11/2018   HCT 38.6 11/11/2018   PLT 310 11/11/2018   GLUCOSE 89 11/11/2018   CHOL 185 11/11/2018   TRIG 57 11/11/2018   HDL 49 11/11/2018   LDLCALC 125 (H) 11/11/2018   ALT 15 11/11/2018   AST 17 11/11/2018   NA 138 11/11/2018   K 4.2 11/11/2018   CL 99 11/11/2018   CREATININE 0.71 11/11/2018   BUN 16 11/11/2018   CO2 26  11/11/2018   TSH 1.250 11/11/2018   HGBA1C 5.0 06/24/2016    Mm 3d Screen Breast Bilateral  Result Date: 01/15/2019 CLINICAL DATA:  Screening. EXAM: DIGITAL SCREENING BILATERAL MAMMOGRAM WITH TOMO AND CAD COMPARISON:  Previous exam(s). ACR Breast Density Category c: The breast tissue is heterogeneously dense, which may obscure small masses. FINDINGS: There are no findings suspicious for malignancy. Images were processed with CAD. IMPRESSION: No mammographic evidence of malignancy. A result letter of this screening mammogram will be mailed directly to the patient. RECOMMENDATION: Screening mammogram in one year. (Code:SM-B-01Y) BI-RADS CATEGORY  1: Negative. Electronically Signed   By: Marin Olp M.D.   On: 01/15/2019 16:47    Assessment & Plan:   Jlynn was seen today for knee pain.  Diagnoses and all orders for this visit:  Acute leg pain, left -     VAS [place] LOWER EXTREMITY VENOUS (DVT); Future -     ketorolac (TORADOL) 30 MG/ML injection 30 mg -     Ambulatory referral to Sports Medicine   I am having Juleen T. Hollinger maintain her ibuprofen, Vitamin D (Cholecalciferol), fexofenadine, aspirin-acetaminophen-caffeine, fluticasone, pantoprazole, ALPRAZolam, sertraline, and rizatriptan. We administered ketorolac.  Meds ordered this encounter  Medications  . ketorolac (TORADOL) 30 MG/ML injection 30 mg    Problem List Items Addressed This Visit    None    Visit Diagnoses    Acute leg pain, left    -  Primary   Relevant Medications   ketorolac (TORADOL) 30 MG/ML injection 30 mg (Completed)   Other Relevant Orders   VAS [place] LOWER EXTREMITY VENOUS (DVT) (Completed)   Ambulatory referral to Sports Medicine       Follow-up: Return if symptoms worsen or fail to improve.  Wilfred Lacy, NP

## 2019-01-21 ENCOUNTER — Ambulatory Visit: Payer: Managed Care, Other (non HMO) | Admitting: Family Medicine

## 2019-01-21 ENCOUNTER — Encounter: Payer: Self-pay | Admitting: Nurse Practitioner

## 2019-01-22 ENCOUNTER — Encounter: Payer: Self-pay | Admitting: Family Medicine

## 2019-01-22 ENCOUNTER — Ambulatory Visit: Payer: Self-pay

## 2019-01-22 ENCOUNTER — Other Ambulatory Visit: Payer: Self-pay

## 2019-01-22 ENCOUNTER — Ambulatory Visit (INDEPENDENT_AMBULATORY_CARE_PROVIDER_SITE_OTHER): Payer: Managed Care, Other (non HMO) | Admitting: Family Medicine

## 2019-01-22 VITALS — BP 123/87 | Ht 67.0 in | Wt 199.0 lb

## 2019-01-22 DIAGNOSIS — M25562 Pain in left knee: Secondary | ICD-10-CM

## 2019-01-22 MED ORDER — PENNSAID 2 % TD SOLN: 1.0000 "application " | Freq: Two times a day (BID) | TRANSDERMAL | 3 refills | Status: DC

## 2019-01-22 NOTE — Patient Instructions (Signed)
Nice to meet you  Please try ice and compression  Please try the exercises Please try the rub on medicine  Please send me a message in MyChart with any questions or updates.  Please see me back in 4 weeks.   --Dr. Raeford Razor

## 2019-01-22 NOTE — Progress Notes (Signed)
Shelby Rios - 43 y.o. female MRN [id number]  Date of birth: 04/01/1976  SUBJECTIVE:  Including CC & ROS.  Chief Complaint  Patient presents with  . Leg Pain    left leg    Shelby Rios is a 43 y.o. female that is presenting with acute left knee pain.  She had severe pain that started a few days ago.  She has some radiation up the thigh and down the calf.  A venous duplex checking for blood clot was normal.  Today she does not have any pain currently.  She felt the pain in the medial compartment as well as posteriorly.  She denies any inciting event or injury.  The pain was severe and stabbing.  No history of previous surgery or injury.  She did have swelling.  Has been wearing compression and ibuprofen and noticed significant improvement of her swelling today.  No redness or numbness.   Review of Systems  Constitutional: Negative for fever.  HENT: Negative for congestion.   Respiratory: Negative for cough.   Cardiovascular: Negative for chest pain.  Musculoskeletal: Negative for gait problem.  Skin: Negative for color change.  Neurological: Negative for weakness.  Hematological: Negative for adenopathy.    HISTORY: Past Medical, Surgical, Social, and Family History Reviewed & Updated per EMR.   Pertinent Historical Findings include:  Past Medical History:  Diagnosis Date  . Abnormal mammogram, unspecified   . Abnormal weight gain   . Allergy   . Anemia   . Anxiety   . Back pain   . Breast pain, right   . Depression   . Dyspepsia   . Edema   . Fatigue   . GERD (gastroesophageal reflux disease)   . Hypertension    only during pregnancy  . Nonspecific abnormal results of other specified function study   . Palpitations   . Routine general medical examination at a health care facility   . Seizures ([place])    couple seisure around age 75- tx with dilantin for a period then d/c  . Sleep apnea    c-pap wears    Past Surgical History:  Procedure Laterality Date  .  adenoidectomy    . BRCA test     neg  . DILATION AND CURETTAGE OF UTERUS    . PARTIAL HYSTERECTOMY  2008   endometriosis  . TONSILLECTOMY    . WISDOM TOOTH EXTRACTION      No Known Allergies  Family History  Problem Relation Age of Onset  . Hypokalemia Sister        deceased at 15; was getting regular IV K infusions; long Hx of pseudotumor cerebri  . Heart disease Father        CABG age 98  . Diabetes Father   . Ovarian cancer Mother   . Stroke Mother 27  . Cancer Mother        ovarian  . Diabetes Mother   . Seizures Mother   . Heart attack Other        GF - 35  . Coronary artery disease Other        family hx  . Hypertension Other        family hx  . Diabetes Other        family hx  . Breast cancer Maternal Grandmother   . Colon cancer Neg Hx   . Esophageal cancer Neg Hx   . Pancreatic cancer Neg Hx   . Rectal cancer Neg Hx   .  Stomach cancer Neg Hx      Social History   Socioeconomic History  . Marital status: Married    Spouse name: Shelby Rios  . Number of children: 2  . Years of education: Not on file  . Highest education level: Bachelor's degree (e.g., BA, AB, BS)  Occupational History  . Occupation: Primary school teacher: [employer]  . Financial resource strain: Not on file  . Food insecurity    Worry: Not on file    Inability: Not on file  . Transportation needs    Medical: Not on file    Non-medical: Not on file  Tobacco Use  . Smoking status: Never Smoker  . Smokeless tobacco: Never Used  . Tobacco comment: nonsmoker  Substance and Sexual Activity  . Alcohol use: No    Alcohol/week: 0.0 standard drinks  . Drug use: No  . Sexual activity: Yes    Birth control/protection: None  Lifestyle  . Physical activity    Days per week: Not on file    Minutes per session: Not on file  . Stress: Not on file  Relationships  . Social Herbalist on phone: Not on file    Gets together: Not on file    Attends religious  service: Not on file    Active member of club or organization: Not on file    Attends meetings of clubs or organizations: Not on file    Relationship status: Not on file  . Intimate partner violence    Fear of current or ex partner: Not on file    Emotionally abused: Not on file    Physically abused: Not on file    Forced sexual activity: Not on file  Other Topics Concern  . Not on file  Social History Narrative   Divorced since 9/08; 2 children   3 coffees/day       Patient is right-handed. She lives with her husband in a one level home. She drinks 4-6 cups of coffee a day, and 1-2 sodas a week. She participates in boot camp 3 x week and hot yoga 3 x a week, each for 1 hour.     PHYSICAL EXAM:  VS: BP 123/87   Ht '5\' 7"'$  (1.702 m)   Wt 199 lb (90.3 kg)   LMP 07/22/2006   BMI 31.17 kg/m  Physical Exam Gen: NAD, alert, cooperative with exam, well-appearing ENT: normal lips, normal nasal mucosa,  Eye: normal EOM, normal conjunctiva and lids CV:  no edema, +2 pedal pulses   Resp: no accessory muscle use, non-labored,  Skin: no rashes, no areas of induration  Neuro: normal tone, normal sensation to touch Psych:  normal insight, alert and oriented MSK:  Left knee:  No effusion  Normal ROM  Normal strength to resistance  Negative McMurray's test. No pain with patellar grind. No instability with valgus varus stress testing. Neurovascular intact  Limited ultrasound: Left knee:  No effusion. Normal appearing quadricep and patellar tendon. Normal-appearing medial meniscus and medial joint line. Normal-appearing lateral meniscus and lateral joint line.   Summary: No findings as to the source of her pain today  Ultrasound and interpretation by Clearance Coots, MD      ASSESSMENT & PLAN:   Acute pain of left knee Likely related to meniscal irritation or a tear not seen on ultrasound today.  Has had improvement of the effusion and pain with compression and ibuprofen  thus far. -Pennsaid -Counseled  on home exercise therapy and supportive care. -Counseled on compression and exercise. -If no improvement can consider physical therapy or x-ray or injection.

## 2019-01-22 NOTE — Assessment & Plan Note (Addendum)
Likely related to meniscal irritation or a tear not seen on ultrasound today.  Has had improvement of the effusion and pain with compression and ibuprofen thus far. -Pennsaid -Counseled on home exercise therapy and supportive care. -Counseled on compression and exercise. -If no improvement can consider physical therapy or x-ray or injection.

## 2019-02-19 ENCOUNTER — Ambulatory Visit: Payer: Managed Care, Other (non HMO) | Admitting: Family Medicine

## 2019-02-24 IMAGING — MG MM DIGITAL SCREENING BILAT W/ TOMO W/ CAD
8 series · 8 of 24 positions shown · non-contrast
Comparison: Previous exam(s).

CLINICAL DATA: Screening.

EXAM:
DIGITAL SCREENING BILATERAL MAMMOGRAM WITH TOMO AND CAD

[L MLO synth-2D]
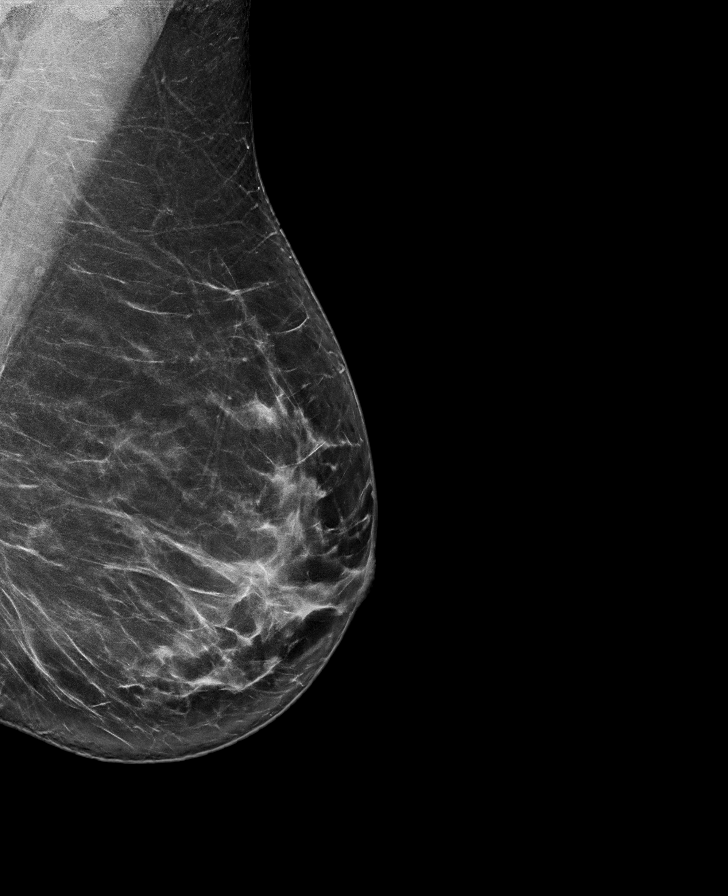

[R MLO synth-2D]
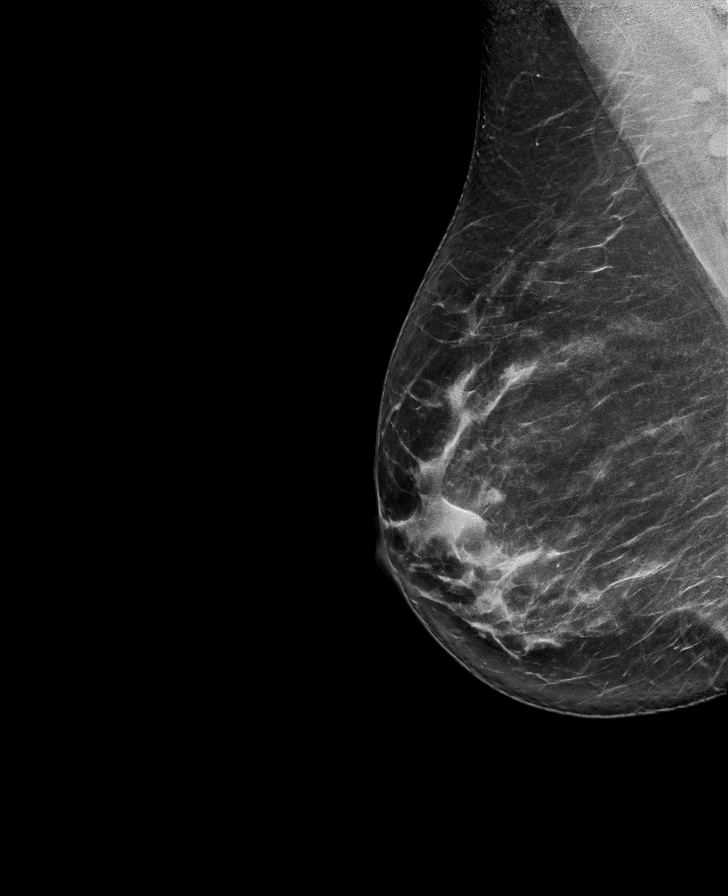

[R CC synth-2D]
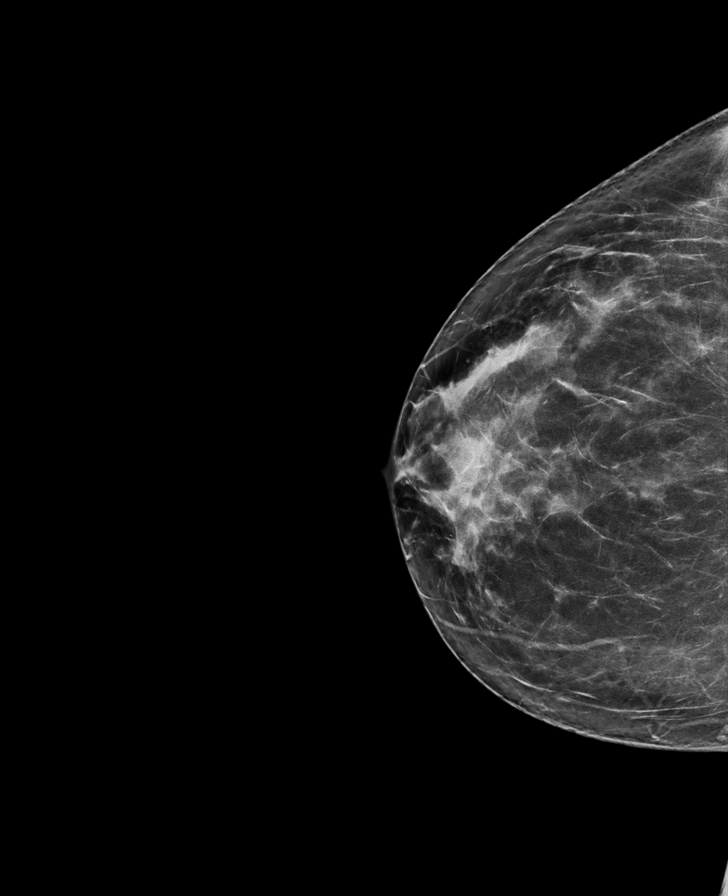

[L CC synth-2D]
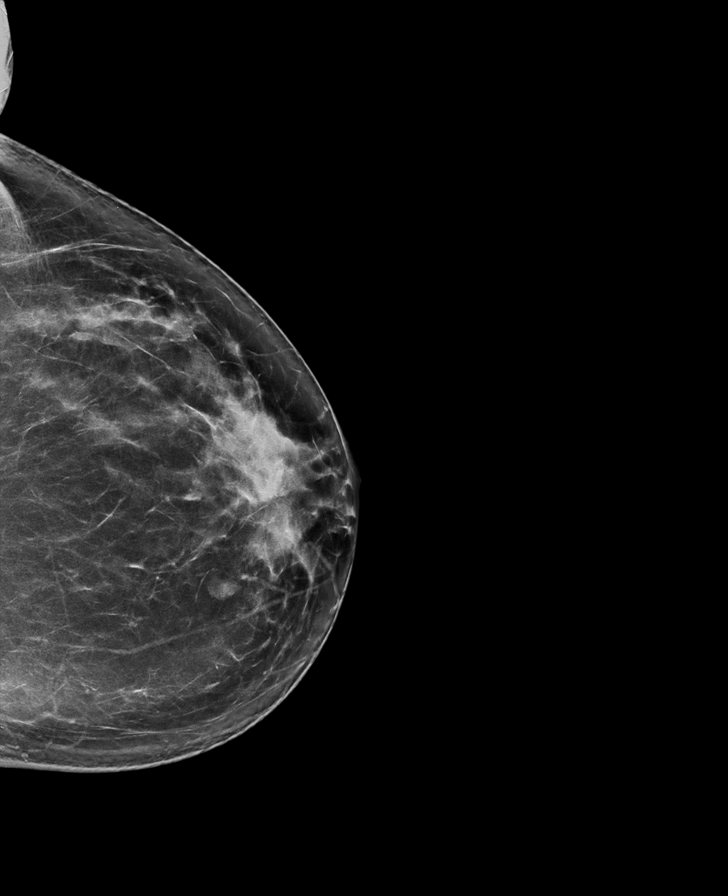

[R MLO tomo · tomo slice 43/86.0]
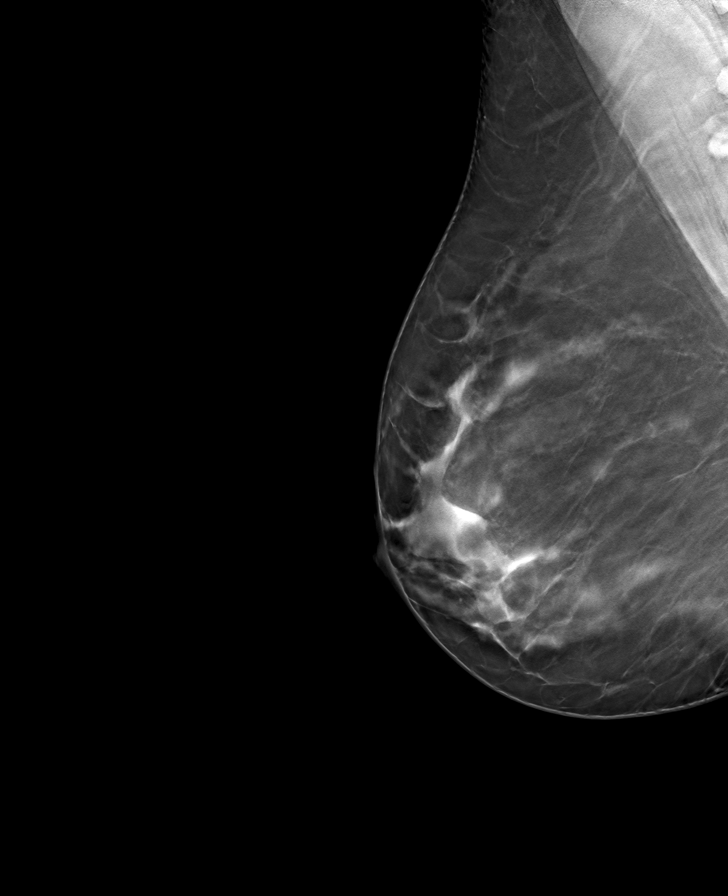

[R CC tomo · tomo slice 37/73.0]
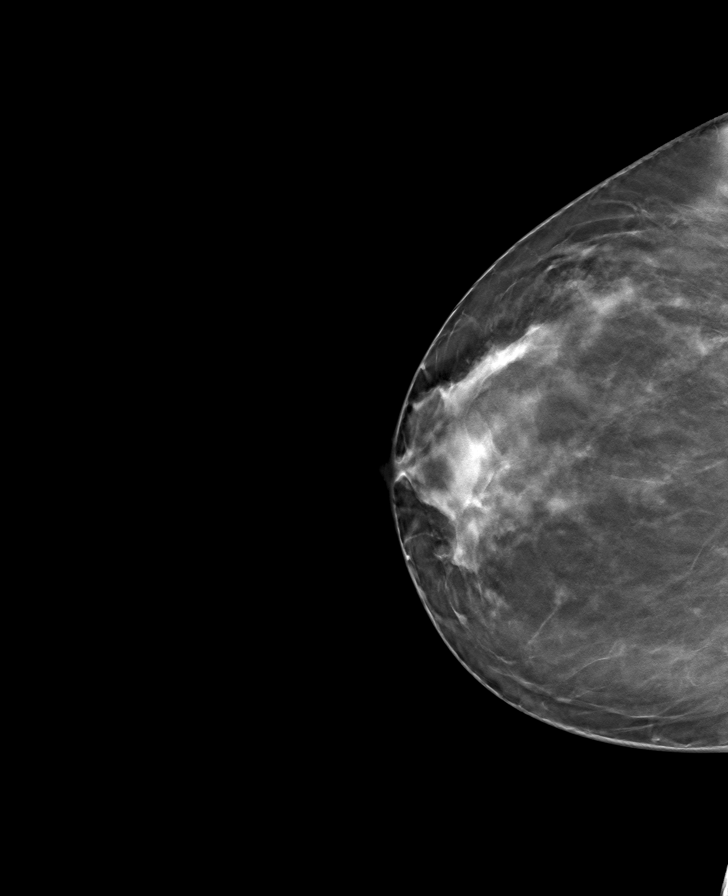

[L MLO tomo · tomo slice 43/85.0]
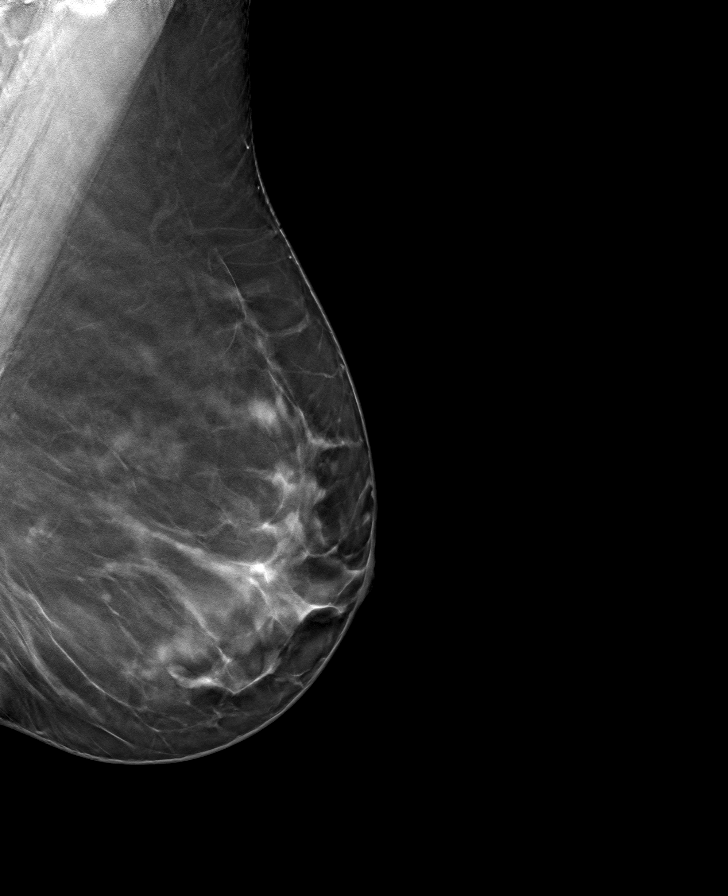

[L CC tomo · tomo slice 39/78.0]
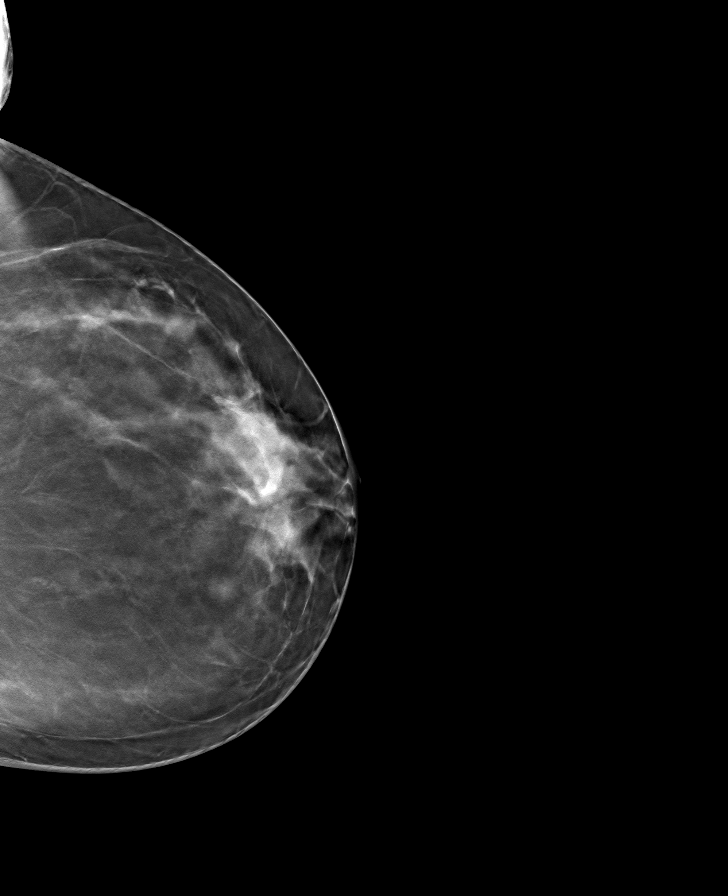

[8 of 24 positions shown; findings below may reference images not displayed]

ACR Breast Density Category c: The breast tissue is heterogeneously
dense, which may obscure small masses.
FINDINGS: There are no findings suspicious for malignancy. Images were
processed with CAD.
IMPRESSION: No mammographic evidence of malignancy. A result letter of this
screening mammogram will be mailed directly to the patient.

RECOMMENDATION:
Screening mammogram in one year. (Code:[id number]-U-LHB)

BI-RADS CATEGORY  1: Negative.

## 2019-05-11 ENCOUNTER — Encounter: Payer: Self-pay | Admitting: Family Medicine

## 2019-05-11 NOTE — Progress Notes (Signed)
Virtual Visit via Video   Due to the COVID-19 pandemic, this visit was completed with telemedicine (audio/video) technology to reduce patient and provider exposure as well as to preserve personal protective equipment.   I connected with Concepcion Elk by a video enabled telemedicine application and verified that I am speaking with the correct person using two identifiers. Location patient: Home Location provider: [facility], Office Persons participating in the virtual visit: HITOMI SLAPE, Arnette Norris, MD   I discussed the limitations of evaluation and management by telemedicine and the availability of in person appointments. The patient expressed understanding and agreed to proceed.  Care Team   Patient Care Team: Lucille Passy, MD as PCP - Loman Brooklyn, Stephan Minister, DO as Consulting Physician (Neurology)  Subjective:   HPI: Patient agrees to virtual visit.   Review of Systems  Constitutional: Negative.  Negative for fever.  HENT: Negative.  Negative for hearing loss and sinus pain.   Eyes: Negative for blurred vision and discharge.  Respiratory: Negative.  Negative for cough and shortness of breath.   Cardiovascular: Negative.  Negative for chest pain.  Gastrointestinal: Positive for abdominal pain and heartburn. Negative for blood in stool, constipation, diarrhea, melena and nausea.  Genitourinary: Negative.  Negative for dysuria.  Musculoskeletal: Negative.  Negative for falls and myalgias.  Skin: Negative.  Negative for rash.  Neurological: Negative.  Negative for dizziness and seizures.  Endo/Heme/Allergies: Does not bruise/bleed easily.  Psychiatric/Behavioral: Negative.  Negative for depression and memory loss.  All other systems reviewed and are negative.    Patient Active Problem List   Diagnosis Date Noted  . Migraines 12/21/2018    Priority: Medium  . Night sweats 05/12/2019  . Epigastric pain 05/12/2019  . Acute pain of left knee 01/22/2019  . Chronic GERD  12/20/2018  . Well woman exam without gynecological exam 11/06/2016  . History of hysterectomy 10/02/2016  . OSA (obstructive sleep apnea) 11/06/2015  . Vitamin D deficiency 11/02/2014  . Vitamin B12 deficiency 11/02/2014  . Other sleep disturbances 10/27/2013  . PALPITATIONS 08/09/2009  . Obesity (BMI 30-39.9) 01/19/2009  . DEPRESSION/ANXIETY 06/03/2007  . DEPENDENT EDEMA 02/19/2007    Social History   Tobacco Use  . Smoking status: Never Smoker  . Smokeless tobacco: Never Used  . Tobacco comment: nonsmoker  Substance Use Topics  . Alcohol use: No    Alcohol/week: 0.0 standard drinks    Current Outpatient Medications:  .  ALPRAZolam (XANAX) 0.25 MG tablet, Take 1 tablet (0.25 mg total) by mouth 2 (two) times daily as needed for anxiety or sleep., Disp: 60 tablet, Rfl: 0 .  aspirin-acetaminophen-caffeine (EXCEDRIN EXTRA STRENGTH) 250-250-65 MG tablet, Take 1 tablet by mouth every 8 (eight) hours as needed for headache., Disp: 30 tablet, Rfl: 0 .  Diclofenac Sodium (PENNSAID) 2 % SOLN, Place 1 application onto the skin 2 (two) times daily., Disp: 112 g, Rfl: 3 .  fexofenadine (ALLEGRA) 180 MG tablet, Take 1 tablet (180 mg total) by mouth daily., Disp: , Rfl:  .  fluticasone (FLONASE) 50 MCG/ACT nasal spray, SPRAY 2 SPRAYS INTO EACH NOSTRIL EVERY DAY, Disp: 16 g, Rfl: 0 .  ibuprofen (ADVIL,MOTRIN) 200 MG tablet, Take 200 mg by mouth every 6 (six) hours as needed. UAD , Disp: , Rfl:  .  pantoprazole (PROTONIX) 40 MG tablet, Take 1 tablet (40 mg total) by mouth 2 (two) times daily before a meal., Disp: 60 tablet, Rfl: 03 .  rizatriptan (MAXALT-MLT) 10 MG disintegrating  tablet, TAKE 1 TABLET BY MOUTH AS NEEDED FOR MIGRAINE. MAY REPEAT IN 2HRS IF NEEDED. MAX 2 TABS/24HRS, Disp: 9 tablet, Rfl: 3 .  sertraline (ZOLOFT) 50 MG tablet, TAKE 1 TABLET BY MOUTH  DAILY, Disp: 90 tablet, Rfl: 3 .  Vitamin D, Cholecalciferol, 1000 units CAPS, Take by mouth., Disp: , Rfl:   No Known  Allergies  Objective:  LMP 07/22/2006   VITALS: Per patient if applicable, see vitals. GENERAL: Alert, appears well and in no acute distress. HEENT: Atraumatic, conjunctiva clear, no obvious abnormalities on inspection of external nose and ears. NECK: Normal movements of the head and neck. CARDIOPULMONARY: No increased WOB. Speaking in clear sentences. I:E ratio WNL.  MS: Moves all visible extremities without noticeable abnormality. PSYCH: Pleasant and cooperative, well-groomed. Speech normal rate and rhythm. Affect is appropriate. Insight and judgement are appropriate. Attention is focused, linear, and appropriate.  NEURO: CN grossly intact. Oriented as arrived to appointment on time with no prompting. Moves both UE equally.  SKIN: No obvious lesions, wounds, erythema, or cyanosis noted on face or hands.  Depression screen [facility] 2/9 12/21/2018 11/12/2017 11/06/2016  Decreased Interest 0 0 0  Down, Depressed, Hopeless 0 0 0  PHQ - 2 Score 0 0 0  Altered sleeping 0 - -  Tired, decreased energy 1 - -  Change in appetite 0 - -  Feeling bad or failure about yourself  0 - -  Trouble concentrating 1 - -  Moving slowly or fidgety/restless 0 - -  Suicidal thoughts 0 - -  PHQ-9 Score 2 - -  Difficult doing work/chores Not difficult at all - -     . COVID-19 Education: The signs and symptoms of COVID-19 were discussed with the patient and how to seek care for testing if needed. The importance of social distancing was discussed today. . Reviewed expectations re: course of current medical issues. . Discussed self-management of symptoms. . Outlined signs and symptoms indicating need for more acute intervention. . Patient verbalized understanding and all questions were answered. Marland Kitchen Health Maintenance issues including appropriate healthy diet, exercise, and smoking avoidance were discussed with patient. . See orders for this visit as documented in the electronic medical record.  Ruthe Mannan,  MD  Records requested if needed. Time spent :25  minutes, of which >50% was spent in obtaining information about her symptoms, reviewing her previous labs, evaluations, and treatments, counseling her about her condition (please see the discussed topics above), and developing a plan to further investigate it; she had a number of questions which I addressed.   Lab Results  Component Value Date   WBC 6.8 11/11/2018   HGB 12.7 11/11/2018   HCT 38.6 11/11/2018   PLT 310 11/11/2018   GLUCOSE 89 11/11/2018   CHOL 185 11/11/2018   TRIG 57 11/11/2018   HDL 49 11/11/2018   LDLCALC 125 (H) 11/11/2018   ALT 15 11/11/2018   AST 17 11/11/2018   NA 138 11/11/2018   K 4.2 11/11/2018   CL 99 11/11/2018   CREATININE 0.71 11/11/2018   BUN 16 11/11/2018   CO2 26 11/11/2018   TSH 1.250 11/11/2018   HGBA1C 5.0 06/24/2016    Lab Results  Component Value Date   TSH 1.250 11/11/2018   Lab Results  Component Value Date   WBC 6.8 11/11/2018   HGB 12.7 11/11/2018   HCT 38.6 11/11/2018   MCV 85 11/11/2018   PLT 310 11/11/2018   Lab Results  Component Value Date  NA 138 11/11/2018   K 4.2 11/11/2018   CO2 26 11/11/2018   GLUCOSE 89 11/11/2018   BUN 16 11/11/2018   CREATININE 0.71 11/11/2018   BILITOT 0.2 11/11/2018   ALKPHOS 69 11/11/2018   AST 17 11/11/2018   ALT 15 11/11/2018   PROT 7.0 11/11/2018   ALBUMIN 4.4 11/11/2018   CALCIUM 9.5 11/11/2018   Lab Results  Component Value Date   CHOL 185 11/11/2018   Lab Results  Component Value Date   HDL 49 11/11/2018   Lab Results  Component Value Date   LDLCALC 125 (H) 11/11/2018   Lab Results  Component Value Date   TRIG 57 11/11/2018   Lab Results  Component Value Date   CHOLHDL 3.8 11/11/2018   Lab Results  Component Value Date   HGBA1C 5.0 06/24/2016       Assessment & Plan:   Problem List Items Addressed This Visit      Active Problems   Night sweats     She is C/O night sweats.  She is unsure if she is having  a hormone shift. Noticing mainly at night for past month. Remote h/o hysterectomy- will check labs, likely perimenopausal.  No orders of the defined types were placed in this encounter.        Epigastric pain    Epigastric pain- intermittent for past couple of weeks. Feels it more on empty stomach.  Ran out of protonix.  No black or bloody stools. No nausea or vomiting. No diarrhea.    ? PUD  Has been tying a bland diet.  Still has gallbladder.  Check H pylori.         I am having Eyonna Sandstrom. Loughner maintain her ibuprofen, Vitamin D (Cholecalciferol), fexofenadine, aspirin-acetaminophen-caffeine, fluticasone, ALPRAZolam, sertraline, rizatriptan, Pennsaid, and pantoprazole.  Meds ordered this encounter  Medications  . pantoprazole (PROTONIX) 40 MG tablet    Sig: Take 1 tablet (40 mg total) by mouth 2 (two) times daily before a meal.    Dispense:  60 tablet    Refill:  03     Ruthe Mannan, MD

## 2019-05-12 ENCOUNTER — Ambulatory Visit (INDEPENDENT_AMBULATORY_CARE_PROVIDER_SITE_OTHER): Payer: Managed Care, Other (non HMO) | Admitting: Family Medicine

## 2019-05-12 DIAGNOSIS — R61 Generalized hyperhidrosis: Secondary | ICD-10-CM | POA: Diagnosis not present

## 2019-05-12 DIAGNOSIS — R1013 Epigastric pain: Secondary | ICD-10-CM

## 2019-05-12 MED ORDER — PANTOPRAZOLE SODIUM 40 MG PO TBEC: 40.0000 mg | DELAYED_RELEASE_TABLET | Freq: Two times a day (BID) | ORAL | Status: DC

## 2019-05-12 NOTE — Assessment & Plan Note (Addendum)
Epigastric pain- intermittent for past couple of weeks. Feels it more on empty stomach.  Ran out of protonix.  No black or bloody stools. No nausea or vomiting. No diarrhea.    ? PUD- restart protonix- eRx refills sent to pharmacy on file.  Has been tying a bland diet.  Still has gallbladder.  Check H pylori.

## 2019-05-12 NOTE — Assessment & Plan Note (Addendum)
She is C/O night sweats.  She is unsure if she is having a hormone shift. Noticing mainly at night for past month. Remote h/o hysterectomy- will check labs, likely perimenopausal.  No orders of the defined types were placed in this encounter.

## 2019-05-17 ENCOUNTER — Other Ambulatory Visit: Payer: Self-pay

## 2019-05-17 ENCOUNTER — Encounter: Payer: Self-pay | Admitting: Family Medicine

## 2019-05-17 MED ORDER — PANTOPRAZOLE SODIUM 40 MG PO TBEC: 40.0000 mg | DELAYED_RELEASE_TABLET | Freq: Two times a day (BID) | ORAL | 3 refills | Status: DC

## 2019-05-18 ENCOUNTER — Ambulatory Visit: Payer: Managed Care, Other (non HMO) | Attending: Internal Medicine

## 2019-05-18 ENCOUNTER — Encounter: Payer: Self-pay | Admitting: Family Medicine

## 2019-05-18 ENCOUNTER — Other Ambulatory Visit: Payer: Self-pay | Admitting: Family Medicine

## 2019-05-18 DIAGNOSIS — Z20822 Contact with and (suspected) exposure to covid-19: Secondary | ICD-10-CM

## 2019-05-18 LAB — HEPATIC FUNCTION PANEL
ALT: 15 (ref 7–35)
AST: 18 (ref 13–35)
Alkaline Phosphatase: 72 (ref 25–125)

## 2019-05-18 LAB — BASIC METABOLIC PANEL
BUN: 10 (ref 4–21)
CO2: 24 — AB (ref 13–22)
Chloride: 102 (ref 99–108)
Creatinine: 0.6 (ref 0.5–1.1)
Glucose: 86
Potassium: 4.2 (ref 3.4–5.3)
Sodium: 138 (ref 137–147)

## 2019-05-18 LAB — COMPREHENSIVE METABOLIC PANEL
Albumin: 4.4 (ref 3.5–5.0)
Calcium: 9.4 (ref 8.7–10.7)
GFR calc Af Amer: 128
GFR calc non Af Amer: 111
Globulin: 2.9

## 2019-05-18 LAB — TSH: TSH: 1.85 (ref 0.41–5.90)

## 2019-05-19 LAB — NOVEL CORONAVIRUS, NAA: SARS-CoV-2, NAA: NOT DETECTED

## 2019-05-20 LAB — COMPREHENSIVE METABOLIC PANEL
ALT: 15 IU/L (ref 0–32)
AST: 18 IU/L (ref 0–40)
Albumin/Globulin Ratio: 1.5 (ref 1.2–2.2)
Albumin: 4.4 g/dL (ref 3.8–4.8)
Alkaline Phosphatase: 72 IU/L (ref 39–117)
BUN/Creatinine Ratio: 16 (ref 9–23)
BUN: 10 mg/dL (ref 6–24)
Bilirubin Total: <0.2 mg/dL (ref 0.0–1.2)
CO2: 24 mmol/L (ref 20–29)
Calcium: 9.4 mg/dL (ref 8.7–10.2)
Chloride: 102 mmol/L (ref 96–106)
Creatinine, Ser: 0.62 mg/dL (ref 0.57–1.00)
GFR calc Af Amer: 128 mL/min/{1.73_m2} (ref >59)
GFR calc non Af Amer: 111 mL/min/{1.73_m2} (ref >59)
Globulin, Total: 2.9 g/dL (ref 1.5–4.5)
Glucose: 86 mg/dL (ref 65–99)
Potassium: 4.2 mmol/L (ref 3.5–5.2)
Sodium: 138 mmol/L (ref 134–144)
Total Protein: 7.3 g/dL (ref 6.0–8.5)

## 2019-05-20 LAB — FOLLICLE STIMULATING HORMONE: FSH: 6.6 m[IU]/mL

## 2019-05-20 LAB — H PYLORI, IGM, IGG, IGA AB
H pylori, IgM Abs: <9 units (ref 0.0–8.9)
H. pylori, IgA Abs: <9 units (ref 0.0–8.9)
H. pylori, IgG AbS: 0.56 Index Value (ref 0.00–0.79)

## 2019-05-20 LAB — LUTEINIZING HORMONE: LH: 5.9 m[IU]/mL

## 2019-05-20 LAB — TSH: TSH: 1.85 u[IU]/mL (ref 0.450–4.500)

## 2019-05-24 ENCOUNTER — Encounter: Payer: Self-pay | Admitting: Family Medicine

## 2019-05-24 ENCOUNTER — Telehealth: Payer: Self-pay

## 2019-05-24 NOTE — Telephone Encounter (Signed)
Initiated PA for Pantoprazole 40mg  #60 per 30d and faxed form to plan/thx dmf

## 2019-05-24 NOTE — Progress Notes (Signed)
[facility]/thx dmf 

## 2019-05-25 NOTE — Telephone Encounter (Signed)
PA approved for #60 per 30d until 2.1.2022/thx dmf

## 2019-06-03 NOTE — Progress Notes (Signed)
Virtual Visit via Video Note The purpose of this virtual visit is to provide medical care while limiting exposure to the novel coronavirus.    Consent was obtained for video visit:  Yes.   Answered questions that patient had about telehealth interaction:  Yes.   I discussed the limitations, risks, security and privacy concerns of performing an evaluation and management service by telemedicine. I also discussed with the patient that there may be a patient responsible charge related to this service. The patient expressed understanding and agreed to proceed.  Pt location: Home Physician Location: office Name of referring provider:  Dianne Dun, MD I connected with Shelby Rios at patients initiation/request on 06/07/2019 at  8:30 AM EST by video enabled telemedicine application and verified that I am speaking with the correct person using two identifiers. Pt MRN:  [id number] Pt DOB:  1975/06/18 Video Participants:  Shelby Rios   History of Present Illness:  Shelby Rios is a 44 year old right-handed Caucasian woman who follows up for migraine.  Recent PCP notes reviewed.  UPDATE: Last seen about a year ago.  At that time, she deferred starting a preventative medication.    Headaches improved after change in diet.  They are usually moderate.  They typically occur 2 to 3 times a week but last no longer than an hour and rarely needs to take Maxalt.  They had been infrequent.  In late January, she woke up and she felt confused and disoriented.  She knew where she was but had to really think about what she immediately needed to do, such as getting out of bed.  For the rest of the day, she felt like she was "in a fog".  She got a Covid test but it was negative.  No associated headache or day prior to event.  However, she thinks she had a headache the next day.  No recurrent episodes since then.  She reports that her vision feels worse than other times in the morning, again usually in the morning.   They are occurring not associated with headache.  It occurs a couple of times a week.  Recent eye exam was unremarkable.  Headaches continue to be minor.  Over the past 6 months, she reports about 10 episodes of sudden onset of mylagias in the legs and arms, like when she has the flu.  She needs to rest for several hours and subsides.    05/18/2019 LABS:  CMP normal; Covid testing negative; TSH normal at 1.850; LH and FSH normal. 11/11/2018 LABS:  B12 527  Frequency of abortive medication: none Current NSAIDS:  ibuprofen Current analgesics:  Excedrin Extra-Strength Current triptans:  Maxalt-MLT 10mg  Current ergotamine:  none Current anti-emetic:  none Current muscle relaxants:  none Current anti-anxiolytic:  Xanax 0.25mg  at bedtime Current sleep aide:  Xanax 0.25mg  Current Antihypertensive medications:  none Current Antidepressant medications:  Sertraline 50mg  Current Anticonvulsant medications:  none Current anti-CGRP:  none Current Vitamins/Herbal/Supplements:  D3 Current Antihistamines/Decongestants:  Flonase; Allegra Other therapy:  none Hormone/birth control:  none Other medications:  none  Caffeine:  Only decaf coffee.. Diet:  72 oz water daily.  Does not skip meals. No longer uses artificial sweetener.  She also discontinued most citric fruits.  Eliminated most processed meats.  Cut back of bread intake.     Exercise:  yes Depression: stable; Anxiety:  stable Other pain:  Nothing significant Sleep hygiene:  Good.  HISTORY: In September 2019, she woke up with  headache described as a heaviness in the left occipital region.  She had associated vertigo, nausea, blurred vision in right eye, and fatigue.  There was no associated aura, vomiting, fever, dysarthria, dysphagia or unilateral numbness or weakness.  She was diagnosed with migraines and eustachian tube dysfunction and prescribed Excedrin Extra Strength, Allegra and Flonase.  Symptoms subsequently subsided.  She started  having headaches again that December.  They are a moderate to severe pressure in the left occipital region into the neck that becomes bifrontal and right temple.  They typically last an hour but sometimes they last 3 or 4 hours.  She was getting them once a week but hasn't had one in about 2 weeks.  Maybe slight phonophobia.  No nausea, vomiting, photophobia.  When severe, she notes paresthesias above the right eyebrow.  The day before or the day after a headache, she reports slight blurred vision.  Since the initial event, she has woken up a couple of times feeling off-balance.  There are no associated triggers.  Rest, Flonase and Excedrin relieves them.  She also notes increased episodes of brief shivering of her entire body.  They last a moment.  They are separate from her headaches.  Eye exam was unremarkable.  She does have history of headaches as a child and intermittently as an adult.  However, these are different.  Other past medical history notable for isolated seizure at age 46.   Past NSAIDS:  none Past analgesics:  none Past abortive triptans:  none Past abortive ergotamine:  none Past muscle relaxants:  none Past anti-emetic:  none Past antihypertensive medications:  none Past antidepressant medications:  none Past anticonvulsant medications:  none Past anti-CGRP:  none Past vitamins/Herbal/Supplements:  none Past antihistamines/decongestants:  none Other past therapies:  none  Family history of headache: mom Other family history:  Mother has epilepsy.  Sister died of complications of untreated Chiari malformation.  Past Medical History: Past Medical History:  Diagnosis Date  . Abnormal mammogram, unspecified   . Abnormal weight gain   . Allergy   . Anemia   . Anxiety   . Back pain   . Breast pain, right   . Depression   . Dyspepsia   . Edema   . Fatigue   . GERD (gastroesophageal reflux disease)   . Hypertension    only during pregnancy  . Nonspecific  abnormal results of other specified function study   . Palpitations   . Routine general medical examination at a health care facility   . Seizures ([place])    couple seisure around age 59- tx with dilantin for a period then d/c  . Sleep apnea    c-pap wears    Medications: Outpatient Encounter Medications as of 06/07/2019  Medication Sig  . ALPRAZolam (XANAX) 0.25 MG tablet Take 1 tablet (0.25 mg total) by mouth 2 (two) times daily as needed for anxiety or sleep.  Marland Kitchen aspirin-acetaminophen-caffeine (EXCEDRIN EXTRA STRENGTH) 250-250-65 MG tablet Take 1 tablet by mouth every 8 (eight) hours as needed for headache.  . Diclofenac Sodium (PENNSAID) 2 % SOLN Place 1 application onto the skin 2 (two) times daily.  . fexofenadine (ALLEGRA) 180 MG tablet Take 1 tablet (180 mg total) by mouth daily.  . fluticasone (FLONASE) 50 MCG/ACT nasal spray SPRAY 2 SPRAYS INTO EACH NOSTRIL EVERY DAY  . ibuprofen (ADVIL,MOTRIN) 200 MG tablet Take 200 mg by mouth every 6 (six) hours as needed. UAD   . pantoprazole (PROTONIX) 40 MG tablet Take  1 tablet (40 mg total) by mouth 2 (two) times daily before a meal.  . rizatriptan (MAXALT-MLT) 10 MG disintegrating tablet TAKE 1 TABLET BY MOUTH AS NEEDED FOR MIGRAINE. MAY REPEAT IN 2HRS IF NEEDED. MAX 2 TABS/24HRS  . sertraline (ZOLOFT) 50 MG tablet TAKE 1 TABLET BY MOUTH  DAILY  . Vitamin D, Cholecalciferol, 1000 units CAPS Take by mouth.   No facility-administered encounter medications on file as of 06/07/2019.    Allergies: No Known Allergies  Family History: Family History  Problem Relation Age of Onset  . Hypokalemia Sister        deceased at 58; was getting regular IV K infusions; long Hx of pseudotumor cerebri  . Heart disease Father        CABG age 82  . Diabetes Father   . Ovarian cancer Mother   . Stroke Mother 23  . Cancer Mother        ovarian  . Diabetes Mother   . Seizures Mother   . Heart attack Other        GF - 35  . Coronary artery disease  Other        family hx  . Hypertension Other        family hx  . Diabetes Other        family hx  . Breast cancer Maternal Grandmother   . Colon cancer Neg Hx   . Esophageal cancer Neg Hx   . Pancreatic cancer Neg Hx   . Rectal cancer Neg Hx   . Stomach cancer Neg Hx     Social History: Social History   Socioeconomic History  . Marital status: Married    Spouse name: Selena Batten  . Number of children: 2  . Years of education: Not on file  . Highest education level: Bachelor's degree (e.g., BA, AB, BS)  Occupational History  . Occupation: Government social research officer: [employer]  Tobacco Use  . Smoking status: Never Smoker  . Smokeless tobacco: Never Used  . Tobacco comment: nonsmoker  Substance and Sexual Activity  . Alcohol use: No    Alcohol/week: 0.0 standard drinks  . Drug use: No  . Sexual activity: Yes    Birth control/protection: None  Other Topics Concern  . Not on file  Social History Narrative   Divorced since 9/08; 2 children   3 coffees/day       Patient is right-handed. She lives with her husband in a one level home. She drinks 4-6 cups of coffee a day, and 1-2 sodas a week. She participates in boot camp 3 x week and hot yoga 3 x a week, each for 1 hour.   Social Determinants of Health   Financial Resource Strain:   . Difficulty of Paying Living Expenses: Not on file  Food Insecurity:   . Worried About Programme researcher, broadcasting/film/video in the Last Year: Not on file  . Ran Out of Food in the Last Year: Not on file  Transportation Needs:   . Lack of Transportation (Medical): Not on file  . Lack of Transportation (Non-Medical): Not on file  Physical Activity:   . Days of Exercise per Week: Not on file  . Minutes of Exercise per Session: Not on file  Stress:   . Feeling of Stress : Not on file  Social Connections:   . Frequency of Communication with Friends and Family: Not on file  . Frequency of Social Gatherings with Friends and Family: Not on  file  . Attends  Religious Services: Not on file  . Active Member of Clubs or Organizations: Not on file  . Attends Banker Meetings: Not on file  . Marital Status: Not on file  Intimate Partner Violence:   . Fear of Current or Ex-Partner: Not on file  . Emotionally Abused: Not on file  . Physically Abused: Not on file  . Sexually Abused: Not on file    Observations/Objective:   Height 5\' 7"  (1.702 m), weight 200 lb (90.7 kg), last menstrual period 07/22/2006. No acute distress.  Alert and oriented.  Speech fluent and not dysarthric.  Language intact.  Eyes orthophoric on primary gaze.  Face symmetric.  Assessment and Plan:   1.  Confusion/disorientation.  Possibly migraine prodrome.   2.  Migraine with aura, without status migrainosus, not intractable 3.  Difficulty focusing (visually as well as cognitively).  Eye exam reportedly unremarkable.  Will evaluate for B12 deficiency. Unclear if migraine-related as her headaches have continued to be stable. 4.  Intermittent myalgias.  I don't suspect related to migraine or other primary neurologic disorder.  With morning headaches and mild cognitive difficulties, again consider OSA.    Given frequent but manageable headaches, consider starting preventative migraine medication such as zonisamide.  Will defer until test results.  Consider sleep study (patient defers at this time)  1. MRI of brain with and without contrast.  We will also check B12 level.  2. Defer preventative management for now.  Consider zonisamde if needed. 3.  For abortive therapy, Maxalt 4.  Limit use of pain relievers to no more than 2 days out of week to prevent risk of rebound or medication-overuse headache. 5.  Keep headache diary 6.  Exercise, hydration, caffeine cessation, sleep hygiene, monitor for and avoid triggers 7.  Follow up 4 months.   Follow Up Instructions:    -I discussed the assessment and treatment plan with the patient. The patient was provided an  opportunity to ask questions and all were answered. The patient agreed with the plan and demonstrated an understanding of the instructions.   The patient was advised to call back or seek an in-person evaluation if the symptoms worsen or if the condition fails to improve as anticipated.    09/21/2006, DO

## 2019-06-04 ENCOUNTER — Encounter: Payer: Self-pay | Admitting: Neurology

## 2019-06-07 ENCOUNTER — Telehealth (INDEPENDENT_AMBULATORY_CARE_PROVIDER_SITE_OTHER): Payer: Managed Care, Other (non HMO) | Admitting: Neurology

## 2019-06-07 ENCOUNTER — Other Ambulatory Visit: Payer: Self-pay

## 2019-06-07 ENCOUNTER — Encounter: Payer: Self-pay | Admitting: Neurology

## 2019-06-07 VITALS — Ht 67.0 in | Wt 200.0 lb

## 2019-06-07 DIAGNOSIS — H539 Unspecified visual disturbance: Secondary | ICD-10-CM | POA: Diagnosis not present

## 2019-06-07 DIAGNOSIS — E538 Deficiency of other specified B group vitamins: Secondary | ICD-10-CM

## 2019-06-07 DIAGNOSIS — G43109 Migraine with aura, not intractable, without status migrainosus: Secondary | ICD-10-CM

## 2019-06-07 DIAGNOSIS — R410 Disorientation, unspecified: Secondary | ICD-10-CM

## 2019-06-09 ENCOUNTER — Other Ambulatory Visit: Payer: Self-pay | Admitting: Neurology

## 2019-06-10 LAB — VITAMIN B12: Vitamin B-12: 658 pg/mL (ref 232–1245)

## 2019-07-03 ENCOUNTER — Other Ambulatory Visit: Payer: Self-pay

## 2019-07-03 ENCOUNTER — Ambulatory Visit
Admission: RE | Admit: 2019-07-03 | Discharge: 2019-07-03 | Disposition: A | Discharge status: Home / Self Care | Payer: Managed Care, Other (non HMO) | Source: Ambulatory Visit | Attending: Neurology | Admitting: Neurology

## 2019-07-03 DIAGNOSIS — R410 Disorientation, unspecified: Secondary | ICD-10-CM

## 2019-07-03 MED ORDER — GADOBENATE DIMEGLUMINE 529 MG/ML IV SOLN
20.0000 mL | Freq: Once | INTRAVENOUS | Status: AC | PRN
  Administered 2019-07-03: 16:00:00 20 mL via INTRAVENOUS

## 2019-10-04 NOTE — Progress Notes (Deleted)
NEUROLOGY FOLLOW UP OFFICE NOTE  Shelby Rios [id number]  HISTORY OF PRESENT ILLNESS: Shelby Rios is a 44 year oldright-handed Caucasian woman who follows up for migraine with aura.  UPDATE: MRI of brain with and without contrast on 07/03/2019 personally reviewed was limited due to artifact from dental hardware but showed no obvious acute intracranial abnormality   *** Frequency of abortive medication: none Current NSAIDS:ibuprofen Current analgesics:Excedrin Extra-Strength Current triptans:Maxalt-MLT 10mg  Current ergotamine:none Current anti-emetic:none Current muscle relaxants:none Current anti-anxiolytic:Xanax 0.25mg  at bedtime Current sleep aide:Xanax 0.25mg  Current Antihypertensive medications:none Current Antidepressant medications:Sertraline 50mg  Current Anticonvulsant medications:none Current anti-CGRP:none Current Vitamins/Herbal/Supplements:D3 Current Antihistamines/Decongestants:Flonase; Allegra Other therapy:none Hormone/birth control:none Other medications:none  Caffeine:Only decaf coffee.. Diet:72 oz water daily. Does not skip meals. No longer uses artificial sweetener.  She also discontinued most citric fruits.  Eliminated most processed meats.  Cut back of bread intake.    Exercise:yes Depression:stable; Anxiety:stable Other pain:Nothing significant Sleep hygiene:Good.  HISTORY: In September 2019, shewoke up with headache described as a heaviness in the left occipital region. She had associated vertigo, nausea,blurred vision in right eye, and fatigue. There was no associated aura, vomiting, fever, dysarthria, dysphagia or unilateral numbness or weakness. She was diagnosed with migraines and eustachian tube dysfunction and prescribed Excedrin Extra Strength, Allegra and Flonase. Symptoms subsequently subsided. She started having headaches again that December. They are a moderate to severe  pressure in the left occipital region into the neck that becomes bifrontal and right temple. They typically last an hour but sometimes they last 3 or 4 hours. She was getting them once a week but hasn't had one in about 2 weeks. Maybe slight phonophobia. No nausea, vomiting, photophobia. When severe, she notes paresthesias above the right eyebrow. The day before or the day after a headache, she reports slight blurred vision. Since the initial event, she has woken up a couple of times feeling off-balance. There are no associated triggers. Rest, Flonase and Excedrin relieves them. She also notes increased episodes of brief shivering of her entire body. They last a moment. They are separate from her headaches.  In 2020, headaches improved after change in diet.  They are usually moderate.  They typically occur 2 to 3 times a week but last no longer than an hour and rarely needs to take Maxalt.  They had been infrequent.  In late January 2021, she woke up and she felt confused and disoriented.  She knew where she was but had to really think about what she immediately needed to do, such as getting out of bed.  For the rest of the day, she felt like she was "in a fog".  She got a Covid test but it was negative.  No associated headache or day prior to event.  However, she thinks she had a headache the next day.  No recurrent episodes since then.  She reports that her vision feels worse than other times in the morning, again usually in the morning.  They are occurring not associated with headache.  It occurs a couple of times a week.  Recent eye exam was unremarkable.  Headaches continue to be minor.    Starting in 2020, she reports about 10 episodes of sudden onset of mylagias in the legs and arms, like when she has the flu.  She needs to rest for several hours and subsides.    Eye exam was unremarkable.  She does have history of headaches as a child and intermittently as an adult.However, these are  different.Other past medical history notable for isolated seizure at age 6.  Labs: 05/18/2019 TSH normal at 1.850 11/11/2018  B12 527   Past NSAIDS:none Past analgesics:none Past abortive triptans:none Past abortive ergotamine:none Past muscle relaxants:none Past anti-emetic:none Past antihypertensive medications:none Past antidepressant medications:none Past anticonvulsant medications:none Past anti-CGRP:none Past vitamins/Herbal/Supplements:none Past antihistamines/decongestants:none Other past therapies:none  Family history of headache:mom Other family history: Mother has epilepsy. Sister died of complications of untreated Chiari malformation.  PAST MEDICAL HISTORY: Past Medical History:  Diagnosis Date  . Abnormal mammogram, unspecified   . Abnormal weight gain   . Allergy   . Anemia   . Anxiety   . Back pain   . Breast pain, right   . Depression   . Dyspepsia   . Edema   . Fatigue   . GERD (gastroesophageal reflux disease)   . Hypertension    only during pregnancy  . Nonspecific abnormal results of other specified function study   . Palpitations   . Routine general medical examination at a health care facility   . Seizures ([facility])    couple seisure around age 54- tx with dilantin for a period then d/c  . Sleep apnea    c-pap wears    MEDICATIONS: Current Outpatient Medications on File Prior to Visit  Medication Sig Dispense Refill  . ALPRAZolam (XANAX) 0.25 MG tablet Take 1 tablet (0.25 mg total) by mouth 2 (two) times daily as needed for anxiety or sleep. 60 tablet 0  . aspirin-acetaminophen-caffeine (EXCEDRIN EXTRA STRENGTH) 250-250-65 MG tablet Take 1 tablet by mouth every 8 (eight) hours as needed for headache. 30 tablet 0  . Diclofenac Sodium (PENNSAID) 2 % SOLN Place 1 application onto the skin 2 (two) times daily. 112 g 3  . fexofenadine (ALLEGRA) 180 MG tablet Take 1 tablet (180 mg total) by mouth daily.    .  fluticasone (FLONASE) 50 MCG/ACT nasal spray SPRAY 2 SPRAYS INTO EACH NOSTRIL EVERY DAY 16 g 0  . ibuprofen (ADVIL,MOTRIN) 200 MG tablet Take 200 mg by mouth every 6 (six) hours as needed. UAD     . pantoprazole (PROTONIX) 40 MG tablet Take 1 tablet (40 mg total) by mouth 2 (two) times daily before a meal. 180 tablet 3  . rizatriptan (MAXALT-MLT) 10 MG disintegrating tablet TAKE 1 TABLET BY MOUTH AS NEEDED FOR MIGRAINE. MAY REPEAT IN 2HRS IF NEEDED. MAX 2 TABS/24HRS 9 tablet 3  . sertraline (ZOLOFT) 50 MG tablet TAKE 1 TABLET BY MOUTH  DAILY 90 tablet 3  . Vitamin D, Cholecalciferol, 1000 units CAPS Take by mouth.     No current facility-administered medications on file prior to visit.    ALLERGIES: No Known Allergies  FAMILY HISTORY: Family History  Problem Relation Age of Onset  . Hypokalemia Sister        deceased at 73; was getting regular IV K infusions; long Hx of pseudotumor cerebri  . Heart disease Father        CABG age 68  . Diabetes Father   . Ovarian cancer Mother   . Stroke Mother 4  . Cancer Mother        ovarian  . Diabetes Mother   . Seizures Mother   . Heart attack Other        GF - 35  . Coronary artery disease Other        family hx  . Hypertension Other        family hx  . Diabetes Other  family hx  . Breast cancer Maternal Grandmother   . Colon cancer Neg Hx   . Esophageal cancer Neg Hx   . Pancreatic cancer Neg Hx   . Rectal cancer Neg Hx   . Stomach cancer Neg Hx     SOCIAL HISTORY: Social History   Socioeconomic History  . Marital status: Married    Spouse name: Maudie Mercury  . Number of children: 2  . Years of education: Not on file  . Highest education level: Bachelor's degree (e.g., BA, AB, BS)  Occupational History  . Occupation: Primary school teacher: [employer]  Tobacco Use  . Smoking status: Never Smoker  . Smokeless tobacco: Never Used  . Tobacco comment: nonsmoker  Vaping Use  . Vaping Use: Never used  Substance and  Sexual Activity  . Alcohol use: No    Alcohol/week: 0.0 standard drinks  . Drug use: No  . Sexual activity: Yes    Birth control/protection: None  Other Topics Concern  . Not on file  Social History Narrative   Divorced since 9/08; 2 children   3 coffees/day       Patient is right-handed. She lives with her husband in a one level home. She drinks 4-6 cups of coffee a day, and 1-2 sodas a week. She participates in boot camp 3 x week and hot yoga 3 x a week, each for 1 hour.   Social Determinants of Health   Financial Resource Strain:   . Difficulty of Paying Living Expenses:   Food Insecurity:   . Worried About Charity fundraiser in the Last Year:   . Arboriculturist in the Last Year:   Transportation Needs:   . Film/video editor (Medical):   Marland Kitchen Lack of Transportation (Non-Medical):   Physical Activity:   . Days of Exercise per Week:   . Minutes of Exercise per Session:   Stress:   . Feeling of Stress :   Social Connections:   . Frequency of Communication with Friends and Family:   . Frequency of Social Gatherings with Friends and Family:   . Attends Religious Services:   . Active Member of Clubs or Organizations:   . Attends Archivist Meetings:   Marland Kitchen Marital Status:   Intimate Partner Violence:   . Fear of Current or Ex-Partner:   . Emotionally Abused:   Marland Kitchen Physically Abused:   . Sexually Abused:     PHYSICAL EXAM: *** General: No acute distress.  Patient appears well-groomed.   Head:  Normocephalic/atraumatic Eyes:  Fundi examined but not visualized Neck: supple, no paraspinal tenderness, full range of motion Heart:  Regular rate and rhythm Lungs:  Clear to auscultation bilaterally Back: No paraspinal tenderness Neurological Exam: alert and oriented to person, place, and time. Attention span and concentration intact, recent and remote memory intact, fund of knowledge intact.  Speech fluent and not dysarthric, language intact.  CN II-XII intact. Bulk  and tone normal, muscle strength 5/5 throughout.  Sensation to light touch, temperature and vibration intact.  Deep tendon reflexes 2+ throughout, toes downgoing.  Finger to nose and heel to shin testing intact.  Gait normal, Romberg negative.  IMPRESSION: 1.  Migraine with aura, without status migrainosus, not intractable 2.  Confusion/disorientation.  Consider migraine prodrome 3.  Morning headaches and mild cognitive difficulties:  Consider OSA.  PLAN: 1.  For preventative management, *** 2.  For abortive therapy, *** 3.  Limit use of pain relievers to  no more than 2 days out of week to prevent risk of rebound or medication-overuse headache. 4.  Keep headache diary 5.  Exercise, hydration, caffeine cessation, sleep hygiene, monitor for and avoid triggers 6.  Follow up ***   Shon Millet, DO  CC: ***

## 2019-10-05 ENCOUNTER — Ambulatory Visit: Payer: Managed Care, Other (non HMO) | Admitting: Neurology

## 2019-10-29 ENCOUNTER — Telehealth: Payer: Self-pay | Admitting: Nurse Practitioner

## 2019-10-29 NOTE — Telephone Encounter (Signed)
Patient is scheduled for TOC/Physical on 12/23/19. She was previously a patient of Dr. Dayton Martes. In the past, for her physical appts, Dr. Dayton Martes would send her a prescription to have labs done at [facility]. Does Claris Gower also do this and if so, please send that script to the patient.

## 2019-11-05 NOTE — Telephone Encounter (Signed)
Left pt a voicemail per DPR, letting her know how Shelby Rios usually does physicals an blood work and also sent her a my chart message to reply back if she had any questions or to call the office.

## 2019-12-16 ENCOUNTER — Other Ambulatory Visit: Payer: Managed Care, Other (non HMO)

## 2019-12-16 ENCOUNTER — Other Ambulatory Visit: Payer: Self-pay

## 2019-12-16 DIAGNOSIS — Z20822 Contact with and (suspected) exposure to covid-19: Secondary | ICD-10-CM

## 2019-12-17 ENCOUNTER — Other Ambulatory Visit: Payer: Self-pay

## 2019-12-17 MED ORDER — SERTRALINE HCL 50 MG PO TABS: 50.0000 mg | ORAL_TABLET | Freq: Every day | ORAL | 0 refills | Status: DC

## 2019-12-17 NOTE — Telephone Encounter (Signed)
TOC appt scheduled for 12/23/19 Last seen 05/12/19 (Dr. Dayton Martes) Chart supports use of Rx Last filled 10/01/2019 # 90

## 2019-12-18 LAB — NOVEL CORONAVIRUS, NAA: SARS-CoV-2, NAA: NOT DETECTED

## 2019-12-18 LAB — SARS-COV-2, NAA 2 DAY TAT

## 2019-12-22 ENCOUNTER — Other Ambulatory Visit: Payer: Self-pay

## 2019-12-23 ENCOUNTER — Other Ambulatory Visit (HOSPITAL_COMMUNITY)
Admission: RE | Admit: 2019-12-23 | Discharge: 2019-12-23 | Disposition: A | Discharge status: Home / Self Care | Payer: Managed Care, Other (non HMO) | Source: Ambulatory Visit | Attending: Nurse Practitioner | Admitting: Nurse Practitioner

## 2019-12-23 ENCOUNTER — Ambulatory Visit (INDEPENDENT_AMBULATORY_CARE_PROVIDER_SITE_OTHER): Payer: Managed Care, Other (non HMO) | Admitting: Nurse Practitioner

## 2019-12-23 ENCOUNTER — Encounter: Payer: Self-pay | Admitting: Nurse Practitioner

## 2019-12-23 VITALS — BP 110/78 | HR 70 | Temp 97.6°F | Ht 66.5 in | Wt 206.0 lb

## 2019-12-23 DIAGNOSIS — R350 Frequency of micturition: Secondary | ICD-10-CM

## 2019-12-23 DIAGNOSIS — N898 Other specified noninflammatory disorders of vagina: Secondary | ICD-10-CM | POA: Insufficient documentation

## 2019-12-23 DIAGNOSIS — Z1231 Encounter for screening mammogram for malignant neoplasm of breast: Secondary | ICD-10-CM | POA: Diagnosis not present

## 2019-12-23 DIAGNOSIS — N760 Acute vaginitis: Secondary | ICD-10-CM

## 2019-12-23 DIAGNOSIS — Z0000 Encounter for general adult medical examination without abnormal findings: Secondary | ICD-10-CM

## 2019-12-23 DIAGNOSIS — B9689 Other specified bacterial agents as the cause of diseases classified elsewhere: Secondary | ICD-10-CM

## 2019-12-23 NOTE — Patient Instructions (Addendum)
Please provide copy of labs completed by employer. You have normal BP today Maintain DASH diet and regular exercise Schedule appt for repeat mammogram on or after 01/15/2020 Got to lab for blood draw and urine collection.  Health Maintenance, Female Adopting a healthy lifestyle and getting preventive care are important in promoting health and wellness. Ask your health care provider about:  The right schedule for you to have regular tests and exams.  Things you can do on your own to prevent diseases and keep yourself healthy. What should I know about diet, weight, and exercise? Eat a healthy diet   Eat a diet that includes plenty of vegetables, fruits, low-fat dairy products, and lean protein.  Do not eat a lot of foods that are high in solid fats, added sugars, or sodium. Maintain a healthy weight Body mass index (BMI) is used to identify weight problems. It estimates body fat based on height and weight. Your health care provider can help determine your BMI and help you achieve or maintain a healthy weight. Get regular exercise Get regular exercise. This is one of the most important things you can do for your health. Most adults should:  Exercise for at least 150 minutes each week. The exercise should increase your heart rate and make you sweat (moderate-intensity exercise).  Do strengthening exercises at least twice a week. This is in addition to the moderate-intensity exercise.  Spend less time sitting. Even light physical activity can be beneficial. Watch cholesterol and blood lipids Have your blood tested for lipids and cholesterol at 44 years of age, then have this test every 5 years. Have your cholesterol levels checked more often if:  Your lipid or cholesterol levels are high.  You are older than 44 years of age.  You are at high risk for heart disease. What should I know about cancer screening? Depending on your health history and family history, you may need to have  cancer screening at various ages. This may include screening for:  Breast cancer.  Cervical cancer.  Colorectal cancer.  Skin cancer.  Lung cancer. What should I know about heart disease, diabetes, and high blood pressure? Blood pressure and heart disease  High blood pressure causes heart disease and increases the risk of stroke. This is more likely to develop in people who have high blood pressure readings, are of African descent, or are overweight.  Have your blood pressure checked: ? Every 3-5 years if you are 78-27 years of age. ? Every year if you are 66 years old or older. Diabetes Have regular diabetes screenings. This checks your fasting blood sugar level. Have the screening done:  Once every three years after age 47 if you are at a normal weight and have a low risk for diabetes.  More often and at a younger age if you are overweight or have a high risk for diabetes. What should I know about preventing infection? Hepatitis B If you have a higher risk for hepatitis B, you should be screened for this virus. Talk with your health care provider to find out if you are at risk for hepatitis B infection. Hepatitis C Testing is recommended for:  Everyone born from [birth year] through 1965.  Anyone with known risk factors for hepatitis C. Sexually transmitted infections (STIs)  Get screened for STIs, including gonorrhea and chlamydia, if: ? You are sexually active and are younger than 44 years of age. ? You are older than 44 years of age and your health care provider tells  you that you are at risk for this type of infection. ? Your sexual activity has changed since you were last screened, and you are at increased risk for chlamydia or gonorrhea. Ask your health care provider if you are at risk.  Ask your health care provider about whether you are at high risk for HIV. Your health care provider may recommend a prescription medicine to help prevent HIV infection. If you choose to take  medicine to prevent HIV, you should first get tested for HIV. You should then be tested every 3 months for as long as you are taking the medicine. Pregnancy  If you are about to stop having your period (premenopausal) and you may become pregnant, seek counseling before you get pregnant.  Take 400 to 800 micrograms (mcg) of folic acid every day if you become pregnant.  Ask for birth control (contraception) if you want to prevent pregnancy. Osteoporosis and menopause Osteoporosis is a disease in which the bones lose minerals and strength with aging. This can result in bone fractures. If you are 89 years old or older, or if you are at risk for osteoporosis and fractures, ask your health care provider if you should:  Be screened for bone loss.  Take a calcium or vitamin D supplement to lower your risk of fractures.  Be given hormone replacement therapy (HRT) to treat symptoms of menopause. Follow these instructions at home: Lifestyle  Do not use any products that contain nicotine or tobacco, such as cigarettes, e-cigarettes, and chewing tobacco. If you need help quitting, ask your health care provider.  Do not use street drugs.  Do not share needles.  Ask your health care provider for help if you need support or information about quitting drugs. Alcohol use  Do not drink alcohol if: ? Your health care provider tells you not to drink. ? You are pregnant, may be pregnant, or are planning to become pregnant.  If you drink alcohol: ? Limit how much you use to 0-1 drink a day. ? Limit intake if you are breastfeeding.  Be aware of how much alcohol is in your drink. In the [place] one drink equals one 12 oz bottle of beer (355 mL), one 5 oz glass of wine (148 mL), or one 1 oz glass of hard liquor (44 mL). General instructions  Schedule regular health, dental, and eye exams.  Stay current with your vaccines.  Tell your health care provider if: ? You often feel depressed. ? You have  ever been abused or do not feel safe at home. Summary  Adopting a healthy lifestyle and getting preventive care are important in promoting health and wellness.  Follow your health care provider's instructions about healthy diet, exercising, and getting tested or screened for diseases.  Follow your health care provider's instructions on monitoring your cholesterol and blood pressure. This information is not intended to replace advice given to you by your health care provider. Make sure you discuss any questions you have with your health care provider. Document Revised: 04/01/2018 Document Reviewed: 04/01/2018 Elsevier Patient Education  2020 [employer].

## 2019-12-23 NOTE — Progress Notes (Signed)
Subjective:    Patient ID: Shelby Rios, female    DOB: [birth year], 44 y.o.   MRN: [id number]  Patient presents today for CPE   HPI  BP Readings from Last 3 Encounters:  12/23/19 110/78  01/22/19 123/87  01/20/19 120/78   Wt Readings from Last 3 Encounters:  12/23/19 206 lb (93.4 kg)  06/04/19 200 lb (90.7 kg)  01/22/19 199 lb (90.3 kg)   Sexual History (orientation,birth control, marital status, STD):s/p hysterectomy due to endometriosis (benign pathology per patient), no hx of abnormal PAP nor positive HPV, needs to schedule repeat mammogram  Depression/Suicide: Depression screen [facility] 2/9 12/23/2019 12/21/2018 11/12/2017 11/06/2016 11/06/2015 03/03/2015 11/02/2014  Decreased Interest 1 0 0 0 0 1 0  Down, Depressed, Hopeless 0 0 0 0 0 0 0  PHQ - 2 Score 1 0 0 0 0 1 0  Altered sleeping 0 0 - - - 3 -  Tired, decreased energy 2 1 - - - 1 -  Change in appetite 0 0 - - - 0 -  Feeling bad or failure about yourself  0 0 - - - 0 -  Trouble concentrating 1 1 - - - 1 -  Moving slowly or fidgety/restless 0 0 - - - 0 -  Suicidal thoughts 0 0 - - - 0 -  PHQ-9 Score 4 2 - - - 6 -  Difficult doing work/chores Not difficult at all Not difficult at all - - - Not difficult at all -   GAD 7 : Generalized Anxiety Score 12/23/2019 12/21/2018 03/03/2015  Nervous, Anxious, on Edge _0 Control/stop worrying 0 0 1  Worry too much - different things 0 0 2  Trouble relaxing 0 1 2  Restless 0 0 1  Easily annoyed or irritable 0 1 0  Afraid - awful might happen 0 0 1  Total GAD 7 Score _1 Anxiety Difficulty Not difficult at all Not difficult at all Somewhat difficult   Vision:up to date  Dental:uptodate  Immunizations: (TDAP, Hep C screen, Pneumovax, Influenza, zoster)  Health Maintenance  Topic Date Due  . Flu Shot  11/21/2019  .  Hepatitis C: One time screening is recommended by Center for Disease Control  (CDC) for  adults born from [birth year] through 1965.   12/22/2020*  . Tetanus Vaccine   11/13/2027  . COVID-19 Vaccine  Completed  . HIV Screening  Completed  *Topic was postponed. The date shown is not the original due date.   Diet:regular  Weight:  Wt Readings from Last 3 Encounters:  12/23/19 206 lb (93.4 kg)  06/04/19 200 lb (90.7 kg)  01/22/19 199 lb (90.3 kg)   Fall Risk: Fall Risk  06/04/2019 12/21/2018 06/12/2018 11/12/2017  Falls in the past year? 0 0 0 No  Number falls in past yr: 0 - - -  Injury with Fall? 0 - - -  Follow up - Falls evaluation completed Falls evaluation completed -   Advanced Directive: Advanced Directives 06/04/2019  Does Patient Have a Medical Advance Directive? No    Medications and allergies reviewed with patient and updated if appropriate.  Patient Active Problem List   Diagnosis Date Noted  . Night sweats 05/12/2019  . Epigastric pain 05/12/2019  . Acute pain of left knee 01/22/2019  . Migraines 12/21/2018  . Chronic GERD 12/20/2018  . Well woman exam without gynecological exam 11/06/2016  . History of hysterectomy 10/02/2016  . OSA (obstructive sleep apnea) 11/06/2015  .  Vitamin D deficiency 11/02/2014  . Vitamin B12 deficiency 11/02/2014  . Other sleep disturbances 10/27/2013  . PALPITATIONS 08/09/2009  . Obesity (BMI 30-39.9) 01/19/2009  . DEPRESSION/ANXIETY 06/03/2007  . DEPENDENT EDEMA 02/19/2007    Current Outpatient Medications on File Prior to Visit  Medication Sig Dispense Refill  . ALPRAZolam (XANAX) 0.25 MG tablet Take 1 tablet (0.25 mg total) by mouth 2 (two) times daily as needed for anxiety or sleep. 60 tablet 0  . aspirin-acetaminophen-caffeine (EXCEDRIN EXTRA STRENGTH) 250-250-65 MG tablet Take 1 tablet by mouth every 8 (eight) hours as needed for headache. 30 tablet 0  . Diclofenac Sodium (PENNSAID) 2 % SOLN Place 1 application onto the skin 2 (two) times daily. 112 g 3  . fexofenadine (ALLEGRA) 180 MG tablet Take 1 tablet (180 mg total) by mouth daily.    . fluticasone (FLONASE) 50 MCG/ACT nasal spray  SPRAY 2 SPRAYS INTO EACH NOSTRIL EVERY DAY 16 g 0  . rizatriptan (MAXALT-MLT) 10 MG disintegrating tablet TAKE 1 TABLET BY MOUTH AS NEEDED FOR MIGRAINE. MAY REPEAT IN 2HRS IF NEEDED. MAX 2 TABS/24HRS 9 tablet 3  . sertraline (ZOLOFT) 50 MG tablet Take 1 tablet (50 mg total) by mouth daily. Maintain upcoming appt for additional refills 30 tablet 0  . Vitamin D, Cholecalciferol, 1000 units CAPS Take by mouth.    . pantoprazole (PROTONIX) 40 MG tablet Take 1 tablet (40 mg total) by mouth 2 (two) times daily before a meal. 180 tablet 3   No current facility-administered medications on file prior to visit.    Past Medical History:  Diagnosis Date  . Abnormal mammogram, unspecified   . Abnormal weight gain   . Allergy   . Anemia   . Anxiety   . Back pain   . Breast pain, right   . Depression   . Dyspepsia   . Edema   . Fatigue   . GERD (gastroesophageal reflux disease)   . Hypertension    only during pregnancy  . Nonspecific abnormal results of other specified function study   . Palpitations   . Routine general medical examination at a health care facility   . Seizures ([place])    couple seisure around age 48- tx with dilantin for a period then d/c  . Sleep apnea    c-pap wears    Past Surgical History:  Procedure Laterality Date  . adenoidectomy    . BRCA test     neg  . DILATION AND CURETTAGE OF UTERUS    . PARTIAL HYSTERECTOMY  2008   endometriosis  . TONSILLECTOMY    . WISDOM TOOTH EXTRACTION      Social History   Socioeconomic History  . Marital status: Married    Spouse name: Maudie Mercury  . Number of children: 2  . Years of education: Not on file  . Highest education level: Bachelor's degree (e.g., BA, AB, BS)  Occupational History  . Occupation: Primary school teacher: [employer]  Tobacco Use  . Smoking status: Never Smoker  . Smokeless tobacco: Never Used  . Tobacco comment: nonsmoker  Vaping Use  . Vaping Use: Never used  Substance and Sexual Activity  .  Alcohol use: No    Alcohol/week: 0.0 standard drinks  . Drug use: No  . Sexual activity: Yes    Birth control/protection: Surgical    Comment: hysterectomy  Other Topics Concern  . Not on file  Social History Narrative   Divorced since 9/08; 2 children  3 coffees/day       Patient is right-handed. She lives with her husband in a one level home. She drinks 4-6 cups of coffee a day, and 1-2 sodas a week. She participates in boot camp 3 x week and hot yoga 3 x a week, each for 1 hour.   Social Determinants of Health   Financial Resource Strain:   . Difficulty of Paying Living Expenses: Not on file  Food Insecurity:   . Worried About Charity fundraiser in the Last Year: Not on file  . Ran Out of Food in the Last Year: Not on file  Transportation Needs:   . Lack of Transportation (Medical): Not on file  . Lack of Transportation (Non-Medical): Not on file  Physical Activity:   . Days of Exercise per Week: Not on file  . Minutes of Exercise per Session: Not on file  Stress:   . Feeling of Stress : Not on file  Social Connections:   . Frequency of Communication with Friends and Family: Not on file  . Frequency of Social Gatherings with Friends and Family: Not on file  . Attends Religious Services: Not on file  . Active Member of Clubs or Organizations: Not on file  . Attends Archivist Meetings: Not on file  . Marital Status: Not on file    Family History  Problem Relation Age of Onset  . Hypokalemia Sister        deceased at 3; was getting regular IV K infusions; long Hx of pseudotumor cerebri  . Heart disease Father        CABG age 23  . Diabetes Father   . Ovarian cancer Mother   . Stroke Mother 76  . Cancer Mother 33       ovarian  . Diabetes Mother   . Seizures Mother   . Heart attack Other        GF - 35  . Coronary artery disease Other        family hx  . Hypertension Other        family hx  . Diabetes Other        family hx  . Breast cancer  Maternal Grandmother   . Colon cancer Neg Hx   . Esophageal cancer Neg Hx   . Pancreatic cancer Neg Hx   . Rectal cancer Neg Hx   . Stomach cancer Neg Hx         ROS  Objective:   Vitals:   12/23/19 0817  BP: 110/78  Pulse: 70  Temp: 97.6 F (36.4 C)  SpO2: 100%   Body mass index is 32.75 kg/m.  Physical Examination:  Physical Exam Vitals reviewed. Exam conducted with a chaperone present.  Constitutional:      General: She is not in acute distress.    Appearance: She is well-developed. She is obese.  HENT:     Right Ear: Tympanic membrane, ear canal and external ear normal.     Left Ear: Tympanic membrane, ear canal and external ear normal.  Eyes:     Extraocular Movements: Extraocular movements intact.     Conjunctiva/sclera: Conjunctivae normal.  Cardiovascular:     Rate and Rhythm: Normal rate and regular rhythm.     Pulses: Normal pulses.     Heart sounds: Normal heart sounds.  Pulmonary:     Effort: Pulmonary effort is normal. No respiratory distress.     Breath sounds: Normal breath sounds.  Chest:  Chest wall: No tenderness.     Breasts:        Right: Normal.        Left: Normal.  Abdominal:     General: Bowel sounds are normal.     Palpations: Abdomen is soft.  Genitourinary:    Labia:        Right: No rash or tenderness.        Left: No rash or tenderness.      Vagina: Vaginal discharge and erythema present. No tenderness or bleeding.     Adnexa: Right adnexa normal and left adnexa normal.     Comments: Cervix absent Musculoskeletal:        General: Normal range of motion.     Cervical back: Normal range of motion and neck supple.     Right lower leg: No edema.     Left lower leg: No edema.  Lymphadenopathy:     Cervical: No cervical adenopathy.     Upper Body:     Right upper body: No supraclavicular, axillary or pectoral adenopathy.     Left upper body: No supraclavicular, axillary or pectoral adenopathy.     Lower Body: No right  inguinal adenopathy. No left inguinal adenopathy.  Skin:    General: Skin is warm and dry.  Neurological:     Mental Status: She is alert and oriented to person, place, and time.     Deep Tendon Reflexes: Reflexes are normal and symmetric.  Psychiatric:        Mood and Affect: Mood normal.        Behavior: Behavior normal.        Thought Content: Thought content normal.    ASSESSMENT and PLAN: This visit occurred during the SARS-CoV-2 public health emergency.  Safety protocols were in place, including screening questions prior to the visit, additional usage of staff PPE, and extensive cleaning of exam room while observing appropriate contact time as indicated for disinfecting solutions.   Shequila was seen today for establish care.  Diagnoses and all orders for this visit:  Preventative health care -     Cancel: CBC with Differential/Platelet -     Cancel: TSH -     MM Digital Screening; Future -     Cancel: Comprehensive metabolic panel -     CBC with Differential/Platelet -     Comprehensive metabolic panel -     TSH  Vaginal discharge -     Cervicovaginal ancillary only( [place])  Breast cancer screening by mammogram -     MM Digital Screening; Future  Urinary frequency -     Cancel: Urinalysis -     Urinalysis  Reviewed labs completed by her employer through her phone: lipid panel (normal except mild elevation in LDL). Copy of results requested.    Problem List Items Addressed This Visit    None    Visit Diagnoses    Preventative health care    -  Primary   Relevant Orders   MM Digital Screening   CBC with Differential/Platelet   Comprehensive metabolic panel   TSH   Vaginal discharge       Relevant Orders   Cervicovaginal ancillary only( [place])   Breast cancer screening by mammogram       Relevant Orders   MM Digital Screening   Urinary frequency       Relevant Orders   Urinalysis      Follow up: Return in about 6 months (around 06/21/2020)  for  anxiety and depression (F2F or video).  Wilfred Lacy, NP

## 2019-12-24 LAB — URINALYSIS
Bilirubin, UA: NEGATIVE
Glucose, UA: NEGATIVE
Ketones, UA: NEGATIVE
Nitrite, UA: NEGATIVE
Protein,UA: NEGATIVE
RBC, UA: NEGATIVE
Specific Gravity, UA: 1.012 (ref 1.005–1.030)
Urobilinogen, Ur: 0.2 mg/dL (ref 0.2–1.0)
pH, UA: 6.5 (ref 5.0–7.5)

## 2019-12-24 LAB — CERVICOVAGINAL ANCILLARY ONLY
Bacterial Vaginitis (gardnerella): POSITIVE — AB
Candida Glabrata: NEGATIVE
Candida Vaginitis: NEGATIVE
Chlamydia: NEGATIVE
Comment: NEGATIVE
Comment: NEGATIVE
Comment: NEGATIVE
Comment: NEGATIVE
Comment: NEGATIVE
Comment: NORMAL
Neisseria Gonorrhea: NEGATIVE
Trichomonas: NEGATIVE

## 2019-12-24 LAB — CBC WITH DIFFERENTIAL/PLATELET
Basophils Absolute: 0 10*3/uL (ref 0.0–0.2)
Basos: 1 %
EOS (ABSOLUTE): 0.2 10*3/uL (ref 0.0–0.4)
Eos: 4 %
Hematocrit: 37.3 % (ref 34.0–46.6)
Hemoglobin: 12.2 g/dL (ref 11.1–15.9)
Immature Grans (Abs): 0 10*3/uL (ref 0.0–0.1)
Immature Granulocytes: 0 %
Lymphocytes Absolute: 1.6 10*3/uL (ref 0.7–3.1)
Lymphs: 29 %
MCH: 28.1 pg (ref 26.6–33.0)
MCHC: 32.7 g/dL (ref 31.5–35.7)
MCV: 86 fL (ref 79–97)
Monocytes Absolute: 0.6 10*3/uL (ref 0.1–0.9)
Monocytes: 10 %
Neutrophils Absolute: 3.1 10*3/uL (ref 1.4–7.0)
Neutrophils: 56 %
Platelets: 290 10*3/uL (ref 150–450)
RBC: 4.34 x10E6/uL (ref 3.77–5.28)
RDW: 12.7 % (ref 11.7–15.4)
WBC: 5.6 10*3/uL (ref 3.4–10.8)

## 2019-12-24 LAB — COMPREHENSIVE METABOLIC PANEL
ALT: 23 IU/L (ref 0–32)
AST: 19 IU/L (ref 0–40)
Albumin/Globulin Ratio: 1.4 (ref 1.2–2.2)
Albumin: 4.2 g/dL (ref 3.8–4.8)
Alkaline Phosphatase: 75 IU/L (ref 48–121)
BUN/Creatinine Ratio: 19 (ref 9–23)
BUN: 13 mg/dL (ref 6–24)
Bilirubin Total: 0.2 mg/dL (ref 0.0–1.2)
CO2: 26 mmol/L (ref 20–29)
Calcium: 9 mg/dL (ref 8.7–10.2)
Chloride: 101 mmol/L (ref 96–106)
Creatinine, Ser: 0.68 mg/dL (ref 0.57–1.00)
GFR calc Af Amer: 123 mL/min/{1.73_m2} (ref >59)
GFR calc non Af Amer: 107 mL/min/{1.73_m2} (ref >59)
Globulin, Total: 2.9 g/dL (ref 1.5–4.5)
Glucose: 97 mg/dL (ref 65–99)
Potassium: 4.3 mmol/L (ref 3.5–5.2)
Sodium: 139 mmol/L (ref 134–144)
Total Protein: 7.1 g/dL (ref 6.0–8.5)

## 2019-12-24 LAB — TSH: TSH: 1.58 u[IU]/mL (ref 0.450–4.500)

## 2019-12-24 MED ORDER — METRONIDAZOLE 0.75 % VA GEL: 1.0000 | Freq: Every day | VAGINAL | 0 refills | Status: DC

## 2019-12-24 NOTE — Addendum Note (Signed)
Addended by: Michaela Corner on: 12/24/2019 01:33 PM   Modules accepted: Orders

## 2019-12-29 ENCOUNTER — Other Ambulatory Visit: Payer: Self-pay | Admitting: Nurse Practitioner

## 2019-12-29 DIAGNOSIS — Z0000 Encounter for general adult medical examination without abnormal findings: Secondary | ICD-10-CM

## 2019-12-29 DIAGNOSIS — Z1231 Encounter for screening mammogram for malignant neoplasm of breast: Secondary | ICD-10-CM

## 2020-01-14 ENCOUNTER — Encounter: Payer: Self-pay | Admitting: Nurse Practitioner

## 2020-01-17 ENCOUNTER — Ambulatory Visit
Admission: RE | Admit: 2020-01-17 | Discharge: 2020-01-17 | Disposition: A | Discharge status: Home / Self Care | Payer: Managed Care, Other (non HMO) | Source: Ambulatory Visit | Attending: Nurse Practitioner | Admitting: Nurse Practitioner

## 2020-01-17 ENCOUNTER — Other Ambulatory Visit: Payer: Self-pay

## 2020-01-17 DIAGNOSIS — Z1231 Encounter for screening mammogram for malignant neoplasm of breast: Secondary | ICD-10-CM

## 2020-01-17 DIAGNOSIS — Z0000 Encounter for general adult medical examination without abnormal findings: Secondary | ICD-10-CM

## 2020-03-07 ENCOUNTER — Encounter: Payer: Self-pay | Admitting: Nurse Practitioner

## 2020-03-08 MED ORDER — SERTRALINE HCL 50 MG PO TABS: 50.0000 mg | ORAL_TABLET | Freq: Every day | ORAL | 3 refills | Status: DC

## 2020-03-10 ENCOUNTER — Other Ambulatory Visit: Payer: Self-pay

## 2020-03-10 DIAGNOSIS — K219 Gastro-esophageal reflux disease without esophagitis: Secondary | ICD-10-CM

## 2020-03-10 MED ORDER — PANTOPRAZOLE SODIUM 40 MG PO TBEC: 40.0000 mg | DELAYED_RELEASE_TABLET | Freq: Two times a day (BID) | ORAL | 3 refills | Status: DC

## 2020-03-10 NOTE — Telephone Encounter (Signed)
Chart supports Rx 

## 2020-04-28 ENCOUNTER — Other Ambulatory Visit: Payer: Managed Care, Other (non HMO)

## 2020-04-28 ENCOUNTER — Other Ambulatory Visit: Payer: Self-pay

## 2020-04-28 DIAGNOSIS — Z20822 Contact with and (suspected) exposure to covid-19: Secondary | ICD-10-CM

## 2020-05-02 LAB — NOVEL CORONAVIRUS, NAA: SARS-CoV-2, NAA: NOT DETECTED

## 2020-05-03 ENCOUNTER — Telehealth: Payer: Self-pay | Admitting: Nurse Practitioner

## 2020-05-03 NOTE — Telephone Encounter (Signed)
Pt said that she hasnt been feeling well and that on 04/25/20 she took a covid test and it came back positive and that she took a test on 04/28/20 and it came back negative. She never ran a fever the whole time but she had body aches, headaches, fatigued. She said yesterday she felt normal and today she has body aches all over and fatigue but no headache and that congestion feels like its starting again. She was wondering what she should do. Please advise

## 2020-05-04 NOTE — Telephone Encounter (Signed)
Schedule video appt tomorrow at 10am and block 10:45am slot. Thank you

## 2020-05-04 NOTE — Telephone Encounter (Signed)
Appt scheduled and patient notified

## 2020-05-05 ENCOUNTER — Telehealth (INDEPENDENT_AMBULATORY_CARE_PROVIDER_SITE_OTHER): Payer: Managed Care, Other (non HMO) | Admitting: Nurse Practitioner

## 2020-05-05 ENCOUNTER — Encounter: Payer: Self-pay | Admitting: Nurse Practitioner

## 2020-05-05 VITALS — Temp 97.5°F | Ht 66.0 in | Wt 197.0 lb

## 2020-05-05 DIAGNOSIS — J0140 Acute pansinusitis, unspecified: Secondary | ICD-10-CM

## 2020-05-05 DIAGNOSIS — Z7189 Other specified counseling: Secondary | ICD-10-CM | POA: Diagnosis not present

## 2020-05-05 MED ORDER — AZITHROMYCIN 250 MG PO TABS: 250.0000 mg | ORAL_TABLET | Freq: Every day | ORAL | 0 refills | Status: DC

## 2020-05-05 NOTE — Progress Notes (Signed)
Virtual Visit via Video Note  I connected with@ on 05/05/20 at 10:00 AM EST by a video enabled telemedicine application and verified that I am speaking with the correct person using two identifiers.  Location: Patient:Home Provider: Office Participants: patient and provider  I discussed the limitations of evaluation and management by telemedicine and the availability of in person appointments. I also discussed with the patient that there may be a patient responsible charge related to this service. The patient expressed understanding and agreed to proceed.  CC:Pt c/o body aches, fatigue and headaches x 10 days. Pt states she tested for COVID on 04/25/20 and results came back positive. On 04/28/20 she retested and it was negative. Pt states she started to feel better until the early part of this week.   History of Present Illness: Shelby Rios is unsure about COVID test results. Onset of URI 04/19/2020, home rapid test completed 04/23/2020 (negative), PCR test completed 04/25/2020 (positive), another PCR test done 04/28/2020 (negative). Initial symptoms resolved 05/01/2020, but returned 05/03/2020. Some improvement with tylenol. She had 2 doses of Moderna. All family members at home are vaccinated, and asymptomatic. Was in close proximity with uncle who is unvaccinated, but he  Had negative PCR test on 04/25/2020. She works from home and attends church in person. Reports she maintain 3-Ws at all time.  Observations/Objective: Physical Exam Vitals reviewed.  Pulmonary:     Effort: Pulmonary effort is normal.  Neurological:     Mental Status: She is alert and oriented to person, place, and time.    Assessment and Plan: Shelby Rios was seen today for acute visit.  Diagnoses and all orders for this visit:  Acute non-recurrent pansinusitis -     azithromycin (ZITHROMAX Z-PAK) 250 MG tablet; Take 1 tablet (250 mg total) by mouth daily. Take 2tabs on first day, then 1tab once a day till complete -      SARS CoV2 Serology(COVID19) AB(IgG,IgM),Immunoassay; Future  Counseled about COVID-19 virus infection -     SARS CoV2 Serology(COVID19) AB(IgG,IgM),Immunoassay; Future   Follow Up Instructions: See avs   I discussed the assessment and treatment plan with the patient. The patient was provided an opportunity to ask questions and all were answered. The patient agreed with the plan and demonstrated an understanding of the instructions.   The patient was advised to call back or seek an in-person evaluation if the symptoms worsen or if the condition fails to improve as anticipated.  Vassie Loll, NP

## 2020-05-09 ENCOUNTER — Other Ambulatory Visit: Payer: Managed Care, Other (non HMO)

## 2020-05-18 ENCOUNTER — Other Ambulatory Visit: Payer: Self-pay

## 2020-05-18 ENCOUNTER — Other Ambulatory Visit: Payer: Managed Care, Other (non HMO)

## 2020-05-18 DIAGNOSIS — J0140 Acute pansinusitis, unspecified: Secondary | ICD-10-CM

## 2020-05-18 DIAGNOSIS — Z7189 Other specified counseling: Secondary | ICD-10-CM

## 2020-05-18 NOTE — Addendum Note (Signed)
Addended by: Varney Biles on: 05/18/2020 04:23 PM   Modules accepted: Orders

## 2020-05-19 ENCOUNTER — Other Ambulatory Visit: Payer: Self-pay | Admitting: Nurse Practitioner

## 2020-05-19 NOTE — Addendum Note (Signed)
Addended by: Varney Biles on: 05/19/2020 09:31 AM   Modules accepted: Orders

## 2020-05-19 NOTE — Addendum Note (Signed)
Addended by: Varney Biles on: 05/19/2020 09:35 AM   Modules accepted: Orders

## 2020-05-20 LAB — SARS-COV-2 ANTIBODY, IGM: SARS-CoV-2 Antibody, IgM: NEGATIVE

## 2020-05-21 LAB — SAR COV2 SEROLOGY (COVID19)AB(IGG),IA
SARS-CoV-2 Semi-Quant IgG Ab: 210 AU/mL (ref <13.0)
SARS-CoV-2 Spike Ab Interp: POSITIVE

## 2020-05-21 LAB — SPECIMEN STATUS REPORT

## 2020-06-22 ENCOUNTER — Telehealth (INDEPENDENT_AMBULATORY_CARE_PROVIDER_SITE_OTHER): Payer: Managed Care, Other (non HMO) | Admitting: Nurse Practitioner

## 2020-06-22 ENCOUNTER — Encounter: Payer: Self-pay | Admitting: Nurse Practitioner

## 2020-06-22 VITALS — Temp 98.6°F | Ht 66.5 in | Wt 211.0 lb

## 2020-06-22 DIAGNOSIS — F325 Major depressive disorder, single episode, in full remission: Secondary | ICD-10-CM | POA: Diagnosis not present

## 2020-06-22 DIAGNOSIS — K219 Gastro-esophageal reflux disease without esophagitis: Secondary | ICD-10-CM | POA: Diagnosis not present

## 2020-06-22 MED ORDER — SERTRALINE HCL 50 MG PO TABS: 50.0000 mg | ORAL_TABLET | Freq: Every day | ORAL | 3 refills | Status: DC

## 2020-06-22 MED ORDER — PANTOPRAZOLE SODIUM 20 MG PO TBEC: 20.0000 mg | DELAYED_RELEASE_TABLET | Freq: Every day | ORAL | 1 refills | Status: DC | PRN

## 2020-06-22 NOTE — Progress Notes (Signed)
Virtual Visit via Video Note  I connected with@ on 06/22/20 at  9:30 AM EST by a video enabled telemedicine application and verified that I am speaking with the correct person using two identifiers.  Location: Patient:Home Provider: Office Participants: patient and provider  I discussed the limitations of evaluation and management by telemedicine and the availability of in person appointments. I also discussed with the patient that there may be a patient responsible charge related to this service. The patient expressed understanding and agreed to proceed.  PY:[id number] and depression f/up  History of Present Illness:  Wt Readings from Last 3 Encounters:  06/22/20 211 lb (95.7 kg)  05/05/20 197 lb (89.4 kg)  12/23/19 206 lb (93.4 kg)   Depression screen [facility] 2/9 06/22/2020 12/23/2019 12/21/2018  Decreased Interest 1 1 0  Down, Depressed, Hopeless 0 0 0  PHQ - 2 Score 1 1 0  Altered sleeping 0 0 0  Tired, decreased energy 0 2 1  Change in appetite 0 0 0  Feeling bad or failure about yourself  0 0 0  Trouble concentrating 1 1 1   Moving slowly or fidgety/restless 0 0 0  Suicidal thoughts 0 0 0  PHQ-9 Score 2 4 2   Difficult doing work/chores Not difficult at all Not difficult at all Not difficult at all   GAD 7 : Generalized Anxiety Score 06/22/2020 12/23/2019 12/21/2018 03/03/2015  Nervous, Anxious, on Edge 0 1 1 2   Control/stop worrying 0 0 0 1  Worry too much - different things 1 0 0 2  Trouble relaxing - 0 1 2  Restless 0 0 0 1  Easily annoyed or irritable 0 0 1 0  Afraid - awful might happen 1 0 0 1  Total GAD 7 Score - 1 3 9   Anxiety Difficulty Not difficult at all Not difficult at all Not difficult at all Somewhat difficult   Observations/Objective: Physical Exam Constitutional:      General: She is not in acute distress. Pulmonary:     Effort: Pulmonary effort is normal.  Neurological:     Mental Status: She is alert and oriented to person, place, and time.  Psychiatric:         Mood and Affect: Mood normal.        Behavior: Behavior normal.        Thought Content: Thought content normal.    Assessment and Plan: Nikeshia was seen today for follow-up.  Diagnoses and all orders for this visit:  Depression, major, single episode, complete remission (HCC) -     sertraline (ZOLOFT) 50 MG tablet; Take 1 tablet (50 mg total) by mouth daily. Maintain upcoming appt for additional refills  Chronic GERD -     pantoprazole (PROTONIX) 20 MG tablet; Take 1 tablet (20 mg total) by mouth daily as needed.   Follow Up Instructions: Maintain current medications F/up in 10months   I discussed the assessment and treatment plan with the patient. The patient was provided an opportunity to ask questions and all were answered. The patient agreed with the plan and demonstrated an understanding of the instructions.   The patient was advised to call back or seek an in-person evaluation if the symptoms worsen or if the condition fails to improve as anticipated.  , NP

## 2020-06-22 NOTE — Assessment & Plan Note (Signed)
Stable mood with zoloft Medication refilled.

## 2020-06-22 NOTE — Assessment & Plan Note (Signed)
Controlled Use of pantoprazole prn Decrease dose to 20mg  dailyprn

## 2020-07-03 ENCOUNTER — Encounter: Payer: Self-pay | Admitting: Pulmonary Disease

## 2020-07-03 ENCOUNTER — Other Ambulatory Visit: Payer: Self-pay

## 2020-07-03 ENCOUNTER — Ambulatory Visit (INDEPENDENT_AMBULATORY_CARE_PROVIDER_SITE_OTHER): Payer: Managed Care, Other (non HMO) | Admitting: Pulmonary Disease

## 2020-07-03 VITALS — BP 120/74 | HR 61 | Temp 97.3°F | Ht 66.5 in | Wt 216.6 lb

## 2020-07-03 DIAGNOSIS — R002 Palpitations: Secondary | ICD-10-CM

## 2020-07-03 DIAGNOSIS — E669 Obesity, unspecified: Secondary | ICD-10-CM | POA: Diagnosis not present

## 2020-07-03 DIAGNOSIS — G4733 Obstructive sleep apnea (adult) (pediatric): Secondary | ICD-10-CM

## 2020-07-03 NOTE — Progress Notes (Signed)
@Patient  ID: , female    DOB: 11-02-1975, 45 y.o.   MRN: [id number]  Chief Complaint  Patient presents with  . sleep consult    Resume cpap 2 weeks ago. Having trouble adjusting to mask.     Referring provider: [id number], NP  HPI:  45 year old female never smoker [facility] for moderate effective sleep apnea.  Reestablishing care as a sleep patient in March/2022  PMH: Depression, obesity, GERD, migraines Smoker/ Smoking History: Never smoker Maintenance: None Pt of: Needs [facility] sleep provider  07/03/2020  - Visit   45 year old female never smoker previously seen in our office by DR for obstructive sleep apnea.  Last seen in office in September/2017.  Patient had a sleep study done in June/2017 that showed an AHI of 17.  She was started on CPAP therapy.  Patient scheduled for a sleep consult today to reestablish care.  Patient CPAP compliance report listed below:  06/03/2020-07/02/2020-CPAP compliance report-15 out of last 30 days used, 10 of those days greater than 4 hours, average usage 5 hours and 42 minutes, APAP setting 5-12, AHI 2.2  Patient reports she started CPAP 2 weeks ago.  She had previously stopped.  She was using her CPAP regularly in 2017 did not notice much difference when she did or did not use it.  She reports that she has noticed a significant difference over the last 2 weeks since resuming CPAP therapy.  She reports that when she does not use her CPAP now she feels significantly worse.  She admits that she has had some weight increases since 2017.  She has a history of nasal obstruction as well as history of adenoid removal.  She was previously using a nasal mask and is now using her mom's fullface mask which she feels like is working better.  She reports that she was previously much more active prior to the COVID-19 pandemic.  She leads a fairly sedentary lifestyle now.  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  Dr. [id number] is planning on doing the surgery.  Dr. Mauri Pole suggested the patient should have an in lab sleep study to help further support insurance coverage for this oral surgery as well as to reevaluate patient's obstructive sleep apnea since it has been 5 years since her last sleep study.  And symptoms have worsened.   Questionaires / Pulmonary Flowsheets:   Epworth:  Results of the Epworth flowsheet 07/03/2020  Sitting and reading 3  Watching TV 2  Sitting, inactive in a public place (e.g. a theatre or a meeting) 2  As a passenger in a car for an hour without a break 3  Lying down to rest in the afternoon when circumstances permit 3  Sitting and talking to someone 0  Sitting quietly after a lunch without alcohol 1  In a car, while stopped for a few minutes in traffic 0  Total score 14    Tests:   FENO:  No results found for: NITRICOXIDE  PFT: No flowsheet data found.  WALK:  No flowsheet data found.  Imaging: No results found.  Lab Results:  CBC    Component Value Date/Time   WBC 5.6 12/23/2019 0919   WBC 10.4 03/10/2007 0505   RBC 4.34 12/23/2019 0919   RBC 3.80 (L) 03/10/2007 0505   HGB 12.2 12/23/2019 0919   HCT 37.3 12/23/2019 0919   PLT 290 12/23/2019 0919   MCV 86 12/23/2019 0919   MCH 28.1  12/23/2019 0919   MCHC 32.7 12/23/2019 0919   MCHC 33.9 03/10/2007 0505   RDW 12.7 12/23/2019 0919   LYMPHSABS 1.6 12/23/2019 0919   EOSABS 0.2 12/23/2019 0919   BASOSABS 0.0 12/23/2019 0919    BMET    Component Value Date/Time   NA 139 12/23/2019 0919   K 4.3 12/23/2019 0919   CL 101 12/23/2019 0919   CO2 26 12/23/2019 0919   GLUCOSE 97 12/23/2019 0919   GLUCOSE 102 (H) 03/10/2007 0505   BUN 13 12/23/2019 0919   CREATININE 0.68 12/23/2019 0919   CALCIUM 9.0 12/23/2019 0919   GFRNONAA 107 12/23/2019 0919   GFRAA 123 12/23/2019 0919    BNP No results found for: BNP  ProBNP No results found for: PROBNP  Specialty Problems       Pulmonary Problems   OSA (obstructive sleep apnea)      No Known Allergies  Immunization History  Administered Date(s) Administered  . Hepatitis B, adult 09/17/2013, 10/15/2013, 03/14/2014  . Influenza Whole 02/05/2010  . Influenza,inj,Quad PF,6+ Mos 03/04/2013, 02/10/2014, 01/12/2015, 01/04/2016, 01/13/2018, 12/21/2018  . Influenza-Unspecified 01/12/2020  . Moderna Sars-Covid-2 Vaccination 05/28/2019, 06/25/2019  . PPD Test 09/17/2013, 10/15/2013  . Tdap 11/12/2017    Past Medical History:  Diagnosis Date  . Abnormal mammogram, unspecified   . Abnormal weight gain   . Allergy   . Anemia   . Anxiety   . Back pain   . Breast pain, right   . Depression   . Dyspepsia   . Edema   . Fatigue   . GERD (gastroesophageal reflux disease)   . Hypertension    only during pregnancy  . Nonspecific abnormal results of other specified function study   . Palpitations   . Routine general medical examination at a health care facility   . Seizures (HCC)    couple seisure around age 39- tx with dilantin for a period then d/c  . Sleep apnea    c-pap wears    Tobacco History: Social History   Tobacco Use  Smoking Status Never Smoker  Smokeless Tobacco Never Used  Tobacco Comment   nonsmoker   Counseling given: Not Answered Comment: nonsmoker   Continue to not smoke  Outpatient Encounter Medications as of 07/03/2020  Medication Sig  . ALPRAZolam (XANAX) 0.25 MG tablet Take 1 tablet (0.25 mg total) by mouth 2 (two) times daily as needed for anxiety or sleep.  . Ascorbic Acid (VITAMIN C PO) Take by mouth.  . fluticasone (FLONASE) 50 MCG/ACT nasal spray SPRAY 2 SPRAYS INTO EACH NOSTRIL EVERY DAY  . pantoprazole (PROTONIX) 20 MG tablet Take 1 tablet (20 mg total) by mouth daily as needed.  . rizatriptan (MAXALT-MLT) 10 MG disintegrating tablet TAKE 1 TABLET BY MOUTH AS NEEDED FOR MIGRAINE. MAY REPEAT IN 2HRS IF NEEDED. MAX 2 TABS/24HRS  . sertraline (ZOLOFT) 50 MG tablet Take 1  tablet (50 mg total) by mouth daily. Maintain upcoming appt for additional refills  . vitamin B-12 (CYANOCOBALAMIN) 100 MCG tablet Take 100 mcg by mouth daily.  . Vitamin D, Cholecalciferol, 1000 units CAPS Take by mouth.  Marland Kitchen ZINC OXIDE PO Take by mouth.  . [DISCONTINUED] loratadine (CLARITIN) 10 MG tablet Take 10 mg by mouth daily as needed.   No facility-administered encounter medications on file as of 07/03/2020.     Review of Systems  Review of Systems  Constitutional: Negative for activity change, fatigue and fever.  HENT: Negative for sinus pressure, sinus pain and sore throat.  Upcoming jaw surgery   Respiratory: Negative for cough, shortness of breath and wheezing.   Cardiovascular: Negative for chest pain and palpitations.  Gastrointestinal: Negative for diarrhea, nausea and vomiting.  Musculoskeletal: Negative for arthralgias.  Neurological: Negative for dizziness.  Psychiatric/Behavioral: Negative for sleep disturbance. The patient is not nervous/anxious.      Physical Exam  BP 120/74 (BP Location: Left Arm, Cuff Size: Normal)   Pulse 61   Temp (!) 97.3 F (36.3 C) (Temporal)   Ht 5' 6.5" (1.689 m)   Wt 216 lb 9.6 oz (98.2 kg)   LMP 07/22/2006   SpO2 98%   BMI 34.44 kg/m   Wt Readings from Last 5 Encounters:  07/03/20 216 lb 9.6 oz (98.2 kg)  06/22/20 211 lb (95.7 kg)  05/05/20 197 lb (89.4 kg)  12/23/19 206 lb (93.4 kg)  06/04/19 200 lb (90.7 kg)    BMI Readings from Last 5 Encounters:  07/03/20 34.44 kg/m  06/22/20 33.55 kg/m  05/05/20 31.80 kg/m  12/23/19 32.75 kg/m  06/04/19 31.32 kg/m     Physical Exam Vitals and nursing note reviewed.  Constitutional:      General: She is not in acute distress.    Appearance: Normal appearance. She is obese.  HENT:     Head: Normocephalic and atraumatic.     Right Ear: Tympanic membrane, ear canal and external ear normal. There is no impacted cerumen.     Left Ear: Tympanic membrane, ear canal  and external ear normal. There is no impacted cerumen.     Nose: Nose normal. No congestion.     Mouth/Throat:     Mouth: Mucous membranes are moist.     Pharynx: Oropharynx is clear.     Comments: MP 1 Eyes:     Pupils: Pupils are equal, round, and reactive to light.  Cardiovascular:     Rate and Rhythm: Normal rate and regular rhythm.     Pulses: Normal pulses.     Heart sounds: Normal heart sounds. No murmur heard.   Pulmonary:     Effort: Pulmonary effort is normal. No respiratory distress.     Breath sounds: Normal breath sounds. No decreased air movement. No decreased breath sounds, wheezing or rales.  Musculoskeletal:     Cervical back: Normal range of motion.  Skin:    General: Skin is warm and dry.     Capillary Refill: Capillary refill takes less than 2 seconds.  Neurological:     General: No focal deficit present.     Mental Status: She is alert and oriented to person, place, and time. Mental status is at baseline.     Gait: Gait normal.  Psychiatric:        Mood and Affect: Mood normal.        Behavior: Behavior normal.        Thought Content: Thought content normal.        Judgment: Judgment normal.       Assessment & Plan:   OSA (obstructive sleep apnea) Plan: Split-night sleep study ordered Continue CPAP therapy   Obesity (BMI 30-39.9) Plan: Continue to work on reducing BMI  PALPITATIONS History of heart palpitations Continue to monitor clinically Continue CPAP therapy    Return in about 3 months (around 10/03/2020), or if symptoms worsen or fail to improve, for [facility].   Coral Ceo, NP 07/03/2020   This appointment required 35 minutes of patient care (this includes precharting, chart review, review of results,  face-to-face care, etc.).

## 2020-07-03 NOTE — Patient Instructions (Addendum)
You were seen today by Coral Ceo, NP  for:   1. OSA (obstructive sleep apnea)  - Split night study; Future  We recommend that you continue using your CPAP daily >>>Keep up the hard work using your device >>> Goal should be wearing this for the entire night that you are sleeping, at least 4 to 6 hours  Remember:  . Do not drive or operate heavy machinery if tired or drowsy.  . Please notify the supply company and office if you are unable to use your device regularly due to missing supplies or machine being broken.  . Work on maintaining a healthy weight and following your recommended nutrition plan  . Maintain proper daily exercise and movement  . Maintaining proper use of your device can also help improve management of other chronic illnesses such as: Blood pressure, blood sugars, and weight management.   BiPAP/ CPAP Cleaning:  >>>Clean weekly, with Dawn soap, and bottle brush.  Set up to air dry. >>> Wipe mask out daily with wet wipe or towelette   2. Palpitations  Continue to monitor your history of heart palpitations  3. Obesity (BMI 30-39.9)  Continue to work on reducing your BMI  We recommend today:  Orders Placed This Encounter  Procedures  . Split night study    Standing Status:   Future    Standing Expiration Date:   01/03/2021    Order Specific Question:   Where should this test be performed:    Answer:   [facility]   Orders Placed This Encounter  Procedures  . Split night study   No orders of the defined types were placed in this encounter.   Follow Up:    Return in about 3 months (around 10/03/2020), or if symptoms worsen or fail to improve, for [facility].   Notification of test results are managed in the following manner: If there are  any recommendations or changes to the  plan of care discussed in office today,  we will contact you and let you know what they are. If you do not hear from [place], then your results are  normal and you can view them through your  MyChart account , or a letter will be sent to you. Thank you again for trusting [place] with your care  - Thank you, [facility]    It is flu season:   >>> Best ways to protect herself from the flu: Receive the yearly flu vaccine, practice good hand hygiene washing with soap and also using hand sanitizer when available, eat a nutritious meals, get adequate rest, hydrate appropriately       Please contact the office if your symptoms worsen or you have concerns that you are not improving.   Thank you for choosing  [facility] for your healthcare, and for allowing [place] to partner with you on your healthcare journey. I am thankful to be able to provide care to you today.   Elisha Headland FNP-C   Sleep Apnea Sleep apnea affects breathing during sleep. It causes breathing to stop for a short time or to become shallow. It can also increase the risk of:  Heart attack.  Stroke.  Being very overweight (obese).  Diabetes.  Heart failure.  Irregular heartbeat. The goal of treatment is to help you breathe normally again. What are the causes? There are three kinds of sleep apnea:  Obstructive sleep apnea. This is caused by a blocked or collapsed airway.  Central sleep apnea. This happens when the brain does not send the right signals to the muscles that control breathing.  Mixed sleep apnea. This is a combination of obstructive and central sleep apnea. The most common cause of this condition is a collapsed or blocked airway. This can happen if:  Your throat muscles are too relaxed.  Your tongue and tonsils are too large.  You are overweight.  Your airway is too small.   What increases the risk?  Being overweight.  Smoking.  Having a small airway.  Being older.  Being female.  Drinking alcohol.  Taking medicines to calm yourself (sedatives or tranquilizers).  Having family members with the condition. What are the  signs or symptoms?  Trouble staying asleep.  Being sleepy or tired during the day.  Getting angry a lot.  Loud snoring.  Headaches in the morning.  Not being able to focus your mind (concentrate).  Forgetting things.  Less interest in sex.  Mood swings.  Personality changes.  Feelings of sadness (depression).  Waking up a lot during the night to pee (urinate).  Dry mouth.  Sore throat. How is this diagnosed?  Your medical history.  A physical exam.  A test that is done when you are sleeping (sleep study). The test is most often done in a sleep lab but may also be done at home. How is this treated?  Sleeping on your side.  Using a medicine to get rid of mucus in your nose (decongestant).  Avoiding the use of alcohol, medicines to help you relax, or certain pain medicines (narcotics).  Losing weight, if needed.  Changing your diet.  Not smoking.  Using a machine to open your airway while you sleep, such as: ? An oral appliance. This is a mouthpiece that shifts your lower jaw forward. ? A CPAP device. This device blows air through a mask when you breathe out (exhale). ? An EPAP device. This has valves that you put in each nostril. ? A BPAP device. This device blows air through a mask when you breathe in (inhale) and breathe out.  Having surgery if other treatments do not work. It is important to get treatment for sleep apnea. Without treatment, it can lead to:  High blood pressure.  Coronary artery disease.  In men, not being able to have an erection (impotence).  Reduced thinking ability.   Follow these instructions at home: Lifestyle  Make changes that your doctor recommends.  Eat a healthy diet.  Lose weight if needed.  Avoid alcohol, medicines to help you relax, and some pain medicines.  Do not use any products that contain nicotine or tobacco, such as cigarettes, e-cigarettes, and chewing tobacco. If you need help quitting, ask your  doctor. General instructions  Take over-the-counter and prescription medicines only as told by your doctor.  If you were given a machine to use while you sleep, use it only as told by your doctor.  If you are having surgery, make sure to tell your doctor you have sleep apnea. You may need to bring your device with you.  Keep all follow-up visits as told by your doctor. This is important. Contact a doctor if:  The machine that you were given to use during sleep bothers you or does not seem to be working.  You do not get better.  You get worse. Get help right away if:  Your chest hurts.  You have trouble breathing in enough air.  You have an uncomfortable feeling  in your back, arms, or stomach.  You have trouble talking.  One side of your body feels weak.  A part of your face is hanging down. These symptoms may be an emergency. Do not wait to see if the symptoms will go away. Get medical help right away. Call your local emergency services (911 in the [place] Do not drive yourself to the hospital. Summary  This condition affects breathing during sleep.  The most common cause is a collapsed or blocked airway.  The goal of treatment is to help you breathe normally while you sleep. This information is not intended to replace advice given to you by your health care provider. Make sure you discuss any questions you have with your health care provider. Document Revised: 01/23/2018 Document Reviewed: 12/02/2017 Elsevier Patient Education  2021 [employer].

## 2020-07-03 NOTE — Assessment & Plan Note (Signed)
Plan: Split-night sleep study ordered Continue CPAP therapy

## 2020-07-03 NOTE — Progress Notes (Signed)
Reviewed and agree with assessment/plan.   Coralyn Helling, MD [facility] 07/03/2020, 1:04 PM Pager:  [phone]

## 2020-07-03 NOTE — Assessment & Plan Note (Signed)
History of heart palpitations Continue to monitor clinically Continue CPAP therapy

## 2020-07-03 NOTE — Assessment & Plan Note (Signed)
Plan: °Continue to work on reducing BMI °

## 2020-08-01 ENCOUNTER — Ambulatory Visit: Payer: Managed Care, Other (non HMO) | Attending: Pulmonary Disease

## 2020-08-01 DIAGNOSIS — G4733 Obstructive sleep apnea (adult) (pediatric): Secondary | ICD-10-CM | POA: Insufficient documentation

## 2020-08-02 ENCOUNTER — Other Ambulatory Visit: Payer: Self-pay

## 2020-08-07 ENCOUNTER — Telehealth (INDEPENDENT_AMBULATORY_CARE_PROVIDER_SITE_OTHER): Payer: Managed Care, Other (non HMO) | Admitting: Pulmonary Disease

## 2020-08-07 DIAGNOSIS — G4733 Obstructive sleep apnea (adult) (pediatric): Secondary | ICD-10-CM | POA: Diagnosis not present

## 2020-08-07 NOTE — Telephone Encounter (Signed)
Very mild OSA 6/h , events only noted during supine sleep Try to avoid sleeping in supine position if possible

## 2020-08-09 NOTE — Telephone Encounter (Signed)
Called and went over results and recommendations per Dr Vassie Loll with patient. All questions answered and patient expressed full understanding of results and recommendations. Patient stated nothing further needed at this time but will call if anything changes. Confirmed upcoming scheduled office visit with Dr Craige Cotta on 09/21/2020 at 9am at the [place] office. Nothing further needed at this time.

## 2020-09-20 ENCOUNTER — Telehealth: Payer: Self-pay

## 2020-09-20 NOTE — Telephone Encounter (Signed)
Spoke with pt regarding CPAP machine, due to taking care of her uncle pt had to reschedule until 7/1.

## 2020-09-21 ENCOUNTER — Ambulatory Visit: Payer: Managed Care, Other (non HMO) | Admitting: Pulmonary Disease

## 2020-10-20 ENCOUNTER — Ambulatory Visit: Payer: Managed Care, Other (non HMO) | Admitting: Pulmonary Disease

## 2020-11-07 ENCOUNTER — Other Ambulatory Visit: Payer: Self-pay | Admitting: Nurse Practitioner

## 2020-11-07 DIAGNOSIS — K219 Gastro-esophageal reflux disease without esophagitis: Secondary | ICD-10-CM

## 2020-12-04 ENCOUNTER — Ambulatory Visit: Payer: Managed Care, Other (non HMO) | Admitting: Pulmonary Disease

## 2020-12-26 ENCOUNTER — Encounter: Payer: Managed Care, Other (non HMO) | Admitting: Nurse Practitioner

## 2020-12-26 ENCOUNTER — Other Ambulatory Visit: Payer: Self-pay | Admitting: Nurse Practitioner

## 2020-12-26 DIAGNOSIS — Z1231 Encounter for screening mammogram for malignant neoplasm of breast: Secondary | ICD-10-CM

## 2021-01-01 ENCOUNTER — Other Ambulatory Visit: Payer: Self-pay

## 2021-01-01 ENCOUNTER — Encounter: Payer: Self-pay | Admitting: Nurse Practitioner

## 2021-01-01 ENCOUNTER — Ambulatory Visit (INDEPENDENT_AMBULATORY_CARE_PROVIDER_SITE_OTHER): Payer: Managed Care, Other (non HMO) | Admitting: Nurse Practitioner

## 2021-01-01 VITALS — BP 118/68 | HR 72 | Temp 97.8°F | Ht 66.0 in | Wt 214.0 lb

## 2021-01-01 DIAGNOSIS — Z23 Encounter for immunization: Secondary | ICD-10-CM | POA: Diagnosis not present

## 2021-01-01 DIAGNOSIS — R102 Pelvic and perineal pain: Secondary | ICD-10-CM

## 2021-01-01 DIAGNOSIS — Z8249 Family history of ischemic heart disease and other diseases of the circulatory system: Secondary | ICD-10-CM | POA: Diagnosis not present

## 2021-01-01 DIAGNOSIS — E559 Vitamin D deficiency, unspecified: Secondary | ICD-10-CM | POA: Diagnosis not present

## 2021-01-01 DIAGNOSIS — E7800 Pure hypercholesterolemia, unspecified: Secondary | ICD-10-CM

## 2021-01-01 DIAGNOSIS — Z0001 Encounter for general adult medical examination with abnormal findings: Secondary | ICD-10-CM | POA: Diagnosis not present

## 2021-01-01 DIAGNOSIS — Z1211 Encounter for screening for malignant neoplasm of colon: Secondary | ICD-10-CM

## 2021-01-01 DIAGNOSIS — Z8742 Personal history of other diseases of the female genital tract: Secondary | ICD-10-CM | POA: Diagnosis not present

## 2021-01-01 DIAGNOSIS — Z8041 Family history of malignant neoplasm of ovary: Secondary | ICD-10-CM | POA: Insufficient documentation

## 2021-01-01 NOTE — Assessment & Plan Note (Addendum)
Onset about 52months ago, intermittent pelvic pain with intercourse. Also complains of ABD bloating and stool frequency (no diarrhea), but this improved with dietary modification (vegan diet without gluten). S/p hysterectomy due to endometriosis, ovaries still present. FHx of ovarian cancer-mother at age 45 She declined STD screen today.  Ordered pelvic US

## 2021-01-01 NOTE — Assessment & Plan Note (Signed)
Stable mood with zoloft 

## 2021-01-01 NOTE — Progress Notes (Signed)
Subjective:    Patient ID: Shelby Rios, female    DOB: 06-15-1975, 45 y.o.   MRN: [id number]  Patient presents today for CPE and eval of chronic conditions  HPI Pelvic pain Onset about 64month ago, intermittent pelvic pain with intercourse. Also complains of ABD bloating and stool frequency (no diarrhea), but this improved with dietary modification (vegan diet without gluten). S/p hysterectomy due to endometriosis, ovaries still present. FHx of ovarian cancer-mother at age [age]She declined STD screen today.  Ordered pelvic [place] Depression, major, single episode, complete remission ([place] Stable mood with zoloft  Mammogram: has upcoming appt 01/2021 Vision:up to date Dental:up to date Diet:vegan Exercise:none Weight:  Wt Readings from Last 3 Encounters:  01/01/21 214 lb (97.1 kg)  07/03/20 216 lb 9.6 oz (98.2 kg)  06/22/20 211 lb (95.7 kg)    Depression/Suicide: [facility] 01/01/2021 06/22/2020 12/23/2019 12/21/2018 11/12/2017 11/06/2016 11/06/2015  Decreased Interest 0 1 1 0 0 0 0  Down, Depressed, Hopeless 0 0 0 0 0 0 0  PHQ - 2 Score 0 1 1 0 0 0 0  Altered sleeping 1 0 0 0 - - -  Tired, decreased energy 1 0 2 1 - - -  Change in appetite 0 0 0 0 - - -  Feeling bad or failure about yourself  0 0 0 0 - - -  Trouble concentrating _0 - - -  Moving slowly or fidgety/restless 0 0 0 0 - - -  Suicidal thoughts 0 0 0 0 - - -  PHQ-9 Score _1 - - -  Difficult doing work/chores Not difficult at all Not difficult at all Not difficult at all Not difficult at all - - -   Immunizations: (TDAP, Hep C screen, Pneumovax, Influenza, zoster)  Health Maintenance  Topic Date Due   COVID-19 Vaccine (3 - Booster for Moderna series) 11/25/2019   Colon Cancer Screening  Never done   Hepatitis C Screening: USPSTF Recommendation to screen - Ages 18-79 yo.  01/01/2022*   Tetanus Vaccine  11/13/2027   Flu Shot  Completed   HIV Screening  Completed   Pneumococcal Vaccination   Aged Out   HPV Vaccine  Aged Out  *Topic was postponed. The date shown is not the original due date.   Fall Risk: Fall Risk  06/04/2019 12/21/2018 06/12/2018 11/12/2017  Falls in the past year? 0 0 0 No  Number falls in past yr: 0 - - -  Injury with Fall? 0 - - -  Follow up - Falls evaluation completed Falls evaluation completed -   Medications and allergies reviewed with patient and updated if appropriate.  Patient Active Problem List   Diagnosis Date Noted   Family history of premature CAD 01/01/2021   History of endometriosis 01/01/2021   Family history of ovarian cancer 01/01/2021   Pelvic pain 01/01/2021   Migraines 12/21/2018   Chronic GERD 12/20/2018   S/P hysterectomy 10/02/2016   OSA (obstructive sleep apnea) 11/06/2015   Vitamin D deficiency 11/02/2014   Vitamin B12 deficiency 11/02/2014   PALPITATIONS 08/09/2009   Obesity (BMI 30-39.9) 01/19/2009   Depression, major, single episode, complete remission ([place] 06/03/2007    Current Outpatient Medications on File Prior to Visit  Medication Sig Dispense Refill   acyclovir (ZOVIRAX) 800 MG tablet Take 800 mg by mouth 3 (three) times daily.     ALPRAZolam (XANAX) 0.25 MG tablet Take 1 tablet (0.25 mg total)  by mouth 2 (two) times daily as needed for anxiety or sleep. 60 tablet 0   Ascorbic Acid (VITAMIN C PO) Take by mouth.     fluticasone (FLONASE) 50 MCG/ACT nasal spray SPRAY 2 SPRAYS INTO EACH NOSTRIL EVERY DAY 16 g 0   pantoprazole (PROTONIX) 20 MG tablet TAKE 1 TABLET BY MOUTH  DAILY AS NEEDED 90 tablet 0   rizatriptan (MAXALT-MLT) 10 MG disintegrating tablet TAKE 1 TABLET BY MOUTH AS NEEDED FOR MIGRAINE. MAY REPEAT IN 2HRS IF NEEDED. MAX 2 TABS/24HRS 9 tablet 3   sertraline (ZOLOFT) 50 MG tablet Take 1 tablet (50 mg total) by mouth daily. Maintain upcoming appt for additional refills 90 tablet 3   vitamin B-12 (CYANOCOBALAMIN) 100 MCG tablet Take 100 mcg by mouth daily.     Vitamin D, Cholecalciferol, 1000 units CAPS  Take by mouth.     ZINC OXIDE PO Take by mouth.     No current facility-administered medications on file prior to visit.    Past Medical History:  Diagnosis Date   Abnormal mammogram, unspecified    Abnormal weight gain    Allergy    Anemia    Anxiety    Back pain    Breast pain, right    Depression    Dyspepsia    Edema    Fatigue    GERD (gastroesophageal reflux disease)    Hypertension    only during pregnancy   Nonspecific abnormal results of other specified function study    Palpitations    Routine general medical examination at a health care facility    Seizures [facility])    couple seisure around age 13- tx with dilantin for a period then d/c   Sleep apnea    c-pap wears    Past Surgical History:  Procedure Laterality Date   ABDOMINAL HYSTERECTOMY     adenoidectomy     BRCA test     neg   DILATION AND CURETTAGE OF UTERUS     PARTIAL HYSTERECTOMY  04/22/2006   endometriosis   TONSILLECTOMY     WISDOM TOOTH EXTRACTION      Social History   Socioeconomic History   Marital status: Married    Spouse name: Maudie Mercury   Number of children: 2   Years of education: Not on file   Highest education level: Bachelor's degree (e.g., BA, AB, BS)  Occupational History   Occupation: Primary school teacher: [employer]  Tobacco Use   Smoking status: Never   Smokeless tobacco: Never   Tobacco comments:    nonsmoker  Vaping Use   Vaping Use: Never used  Substance and Sexual Activity   Alcohol use: No   Drug use: No   Sexual activity: Yes    Birth control/protection: Surgical    Comment: hysterectomy  Other Topics Concern   Not on file  Social History Narrative   Divorced since 9/08; 2 children   3 coffees/day       Patient is right-handed. She lives with her husband in a one level home. She drinks 4-6 cups of coffee a day, and 1-2 sodas a week. She participates in boot camp 3 x week and hot yoga 3 x a week, each for 1 hour.   Social Determinants of Health    Financial Resource Strain: Not on file  Food Insecurity: Not on file  Transportation Needs: Not on file  Physical Activity: Not on file  Stress: Not on file  Social Connections: Not on  file    Family History  Problem Relation Age of Onset   Ovarian cancer Mother    Stroke Mother 83   Cancer Mother 64       ovarian   Diabetes Mother    Seizures Mother    Depression Mother    Hypertension Mother    Obesity Mother    Heart disease Father        CABG age 57   Diabetes Father    Depression Father    Early death Father    Hypertension Father    Obesity Father    Hypokalemia Sister        deceased at 67; was getting regular IV K infusions; long Hx of pseudotumor cerebri   Early death Sister    Breast cancer Maternal Grandmother    Heart attack Other        GF - 35   Coronary artery disease Other        family hx   Hypertension Other        family hx   Diabetes Other        family hx   Colon cancer Neg Hx    Esophageal cancer Neg Hx    Pancreatic cancer Neg Hx    Rectal cancer Neg Hx    Stomach cancer Neg Hx        Review of Systems  Constitutional:  Negative for fever, malaise/fatigue and weight loss.  HENT:  Negative for congestion and sore throat.   Eyes:        Negative for visual changes  Respiratory:  Negative for cough and shortness of breath.   Cardiovascular:  Negative for chest pain, palpitations and leg swelling.  Gastrointestinal:  Negative for blood in stool, constipation, diarrhea and heartburn.  Genitourinary:  Negative for dysuria, frequency and urgency.  Musculoskeletal:  Negative for falls, joint pain and myalgias.  Skin:  Negative for rash.  Neurological:  Negative for dizziness, sensory change and headaches.  Endo/Heme/Allergies:  Does not bruise/bleed easily.  Psychiatric/Behavioral:  Negative for depression, substance abuse and suicidal ideas. The patient is not nervous/anxious and does not have insomnia.    Objective:   Vitals:    01/01/21 1335  BP: 118/68  Pulse: 72  Temp: 97.8 F (36.6 C)  SpO2: 98%   Body mass index is 34.54 kg/m.   Physical Examination:  Physical Exam Vitals reviewed. Exam conducted with a chaperone present.  Constitutional:      General: She is not in acute distress.    Appearance: She is obese.  HENT:     Right Ear: Tympanic membrane, ear canal and external ear normal.     Left Ear: Tympanic membrane, ear canal and external ear normal.  Eyes:     Extraocular Movements: Extraocular movements intact.     Conjunctiva/sclera: Conjunctivae normal.  Cardiovascular:     Rate and Rhythm: Normal rate and regular rhythm.     Pulses: Normal pulses.     Heart sounds: Normal heart sounds.  Pulmonary:     Effort: Pulmonary effort is normal. No respiratory distress.     Breath sounds: Normal breath sounds.  Abdominal:     General: Bowel sounds are normal. There is no distension.     Palpations: Abdomen is soft.     Hernia: There is no hernia in the left inguinal area or right inguinal area.  Genitourinary:    General: Normal vulva.     Labia:  Right: No rash or tenderness.        Left: No rash or tenderness.      Urethra: Prolapse present.     Vagina: Normal. No vaginal discharge.     Uterus: Absent.      Adnexa: Right adnexa normal and left adnexa normal.     Comments: Cervix is absent Musculoskeletal:        General: Normal range of motion.     Cervical back: Normal range of motion and neck supple.     Right lower leg: No edema.     Left lower leg: No edema.  Lymphadenopathy:     Cervical: No cervical adenopathy.     Lower Body: No right inguinal adenopathy. No left inguinal adenopathy.  Skin:    General: Skin is warm and dry.  Neurological:     Mental Status: She is alert and oriented to person, place, and time.  Psychiatric:        Mood and Affect: Mood normal.        Behavior: Behavior normal.        Thought Content: Thought content normal.   ASSESSMENT and  PLAN: This visit occurred during the SARS-CoV-2 public health emergency.  Safety protocols were in place, including screening questions prior to the visit, additional usage of staff PPE, and extensive cleaning of exam room while observing appropriate contact time as indicated for disinfecting solutions.   Jolene was seen today for annual exam.  Diagnoses and all orders for this visit:  Encounter for preventative adult health care exam with abnormal findings -     Cancel: Comprehensive metabolic panel -     Cancel: CBC with Differential/Platelet -     Cancel: TSH -     Ambulatory referral to Gastroenterology -     CBC with Differential/Platelet -     Comprehensive metabolic panel -     TSH  Vitamin D deficiency -     Cancel: VITAMIN D 25 Hydroxy (Vit-D Deficiency, Fractures) -     VITAMIN D 25 Hydroxy (Vit-D Deficiency, Fractures)  Hypercholesteremia -     Cancel: Lipid panel -     Cancel: CRP High sensitivity -     CRP High sensitivity -     Lipid panel  Family history of premature CAD -     Cancel: Lipid panel -     Cancel: CRP High sensitivity -     CRP High sensitivity -     Lipid panel  History of endometriosis -     US Pelvis Complete; Future  Pelvic pain -     US Pelvis Complete; Future  Colon cancer screening -     Ambulatory referral to Gastroenterology  Need for influenza vaccination -     Flu Vaccine QUAD 6+ mos PF IM (Fluarix Quad PF)  Family history of ovarian cancer     Problem List Items Addressed This Visit       Other   Family history of ovarian cancer   Family history of premature CAD   Relevant Orders   CRP High sensitivity   Lipid panel   History of endometriosis   Relevant Orders   US Pelvis Complete   Pelvic pain    Onset about 58month ago, intermittent pelvic pain with intercourse. Also complains of ABD bloating and stool frequency (no diarrhea), but this improved with dietary modification (vegan diet without gluten). S/p  hysterectomy due to endometriosis, ovaries still present. FHx of ovarian cancer-mother at  age 66 She declined STD screen today.  Ordered pelvic US      Relevant Orders   US Pelvis Complete   Vitamin D deficiency   Relevant Orders   VITAMIN D 25 Hydroxy (Vit-D Deficiency, Fractures)   Other Visit Diagnoses     Encounter for preventative adult health care exam with abnormal findings    -  Primary   Relevant Orders   Ambulatory referral to Gastroenterology   CBC with Differential/Platelet   Comprehensive metabolic panel   TSH   Hypercholesteremia       Relevant Orders   CRP High sensitivity   Lipid panel   Colon cancer screening       Relevant Orders   Ambulatory referral to Gastroenterology   Need for influenza vaccination       Relevant Orders   Flu Vaccine QUAD 6+ mos PF IM (Fluarix Quad PF) (Completed)       Follow up: Return in about 1 year (around 01/01/2022) for CPE (fasting).  Wilfred Lacy, NP

## 2021-01-01 NOTE — Patient Instructions (Signed)
Go to lab for blood draw. You will be contacted to schedule appt with GI for colonoscopy and appt for pelvic US.  Start daily exercise  Preventive Care 67-45 Years Old, Female Preventive care refers to lifestyle choices and visits with your health care provider that can promote health and wellness. This includes: A yearly physical exam. This is also called an annual wellness visit. Regular dental and eye exams. Immunizations. Screening for certain conditions. Healthy lifestyle choices, such as: Eating a healthy diet. Getting regular exercise. Not using drugs or products that contain nicotine and tobacco. Limiting alcohol use. What can I expect for my preventive care visit? Physical exam Your health care provider will check your: Height and weight. These may be used to calculate your BMI (body mass index). BMI is a measurement that tells if you are at a healthy weight. Heart rate and blood pressure. Body temperature. Skin for abnormal spots. Counseling Your health care provider may ask you questions about your: Past medical problems. Family's medical history. Alcohol, tobacco, and drug use. Emotional well-being. Home life and relationship well-being. Sexual activity. Diet, exercise, and sleep habits. Work and work Statistician. Access to firearms. Method of birth control. Menstrual cycle. Pregnancy history. What immunizations do I need? Vaccines are usually given at various ages, according to a schedule. Your health care provider will recommend vaccines for you based on your age, medical history, and lifestyle or other factors, such as travel or where you work. What tests do I need? Blood tests Lipid and cholesterol levels. These may be checked every 5 years, or more often if you are over 77 years old. Hepatitis C test. Hepatitis B test. Screening Lung cancer screening. You may have this screening every year starting at age 48 if you have a 30-pack-year history of smoking  and currently smoke or have quit within the past 15 years. Colorectal cancer screening. All adults should have this screening starting at age 78 and continuing until age 50. Your health care provider may recommend screening at age 53 if you are at increased risk. You will have tests every 1-10 years, depending on your results and the type of screening test. Diabetes screening. This is done by checking your blood sugar (glucose) after you have not eaten for a while (fasting). You may have this done every 1-3 years. Mammogram. This may be done every 1-2 years. Talk with your health care provider about when you should start having regular mammograms. This may depend on whether you have a family history of breast cancer. BRCA-related cancer screening. This may be done if you have a family history of breast, ovarian, tubal, or peritoneal cancers. Pelvic exam and Pap test. This may be done every 3 years starting at age 42. Starting at age 31, this may be done every 5 years if you have a Pap test in combination with an HPV test. Other tests STD (sexually transmitted disease) testing, if you are at risk. Bone density scan. This is done to screen for osteoporosis. You may have this scan if you are at high risk for osteoporosis. Talk with your health care provider about your test results, treatment options, and if necessary, the need for more tests. Follow these instructions at home: Eating and drinking  Eat a diet that includes fresh fruits and vegetables, whole grains, lean protein, and low-fat dairy products. Take vitamin and mineral supplements as recommended by your health care provider. Do not drink alcohol if: Your health care provider tells you not to drink.  You are pregnant, may be pregnant, or are planning to become pregnant. If you drink alcohol: Limit how much you have to 0-1 drink a day. Be aware of how much alcohol is in your drink. In the [place] one drink equals one 12 oz bottle of  beer (355 mL), one 5 oz glass of wine (148 mL), or one 1 oz glass of hard liquor (44 mL). Lifestyle Take daily care of your teeth and gums. Brush your teeth every morning and night with fluoride toothpaste. Floss one time each day. Stay active. Exercise for at least 30 minutes 5 or more days each week. Do not use any products that contain nicotine or tobacco, such as cigarettes, e-cigarettes, and chewing tobacco. If you need help quitting, ask your health care provider. Do not use drugs. If you are sexually active, practice safe sex. Use a condom or other form of protection to prevent STIs (sexually transmitted infections). If you do not wish to become pregnant, use a form of birth control. If you plan to become pregnant, see your health care provider for a prepregnancy visit. If told by your health care provider, take low-dose aspirin daily starting at age 39. Find healthy ways to cope with stress, such as: Meditation, yoga, or listening to music. Journaling. Talking to a trusted person. Spending time with friends and family. Safety Always wear your seat belt while driving or riding in a vehicle. Do not drive: If you have been drinking alcohol. Do not ride with someone who has been drinking. When you are tired or distracted. While texting. Wear a helmet and other protective equipment during sports activities. If you have firearms in your house, make sure you follow all gun safety procedures. What's next? Visit your health care provider once a year for an annual wellness visit. Ask your health care provider how often you should have your eyes and teeth checked. Stay up to date on all vaccines. This information is not intended to replace advice given to you by your health care provider. Make sure you discuss any questions you have with your health care provider. Document Revised: 06/16/2020 Document Reviewed: 12/18/2017 Elsevier Patient Education  2022 [place] A vegan  diet excludes all foods that come from animals, including foods that are made from meat, fish, poultry, dairy, and eggs. A vegan diet may be followed for ethical or health reasons. What are tips for following this plan? While following this diet, it is important to find plant sources or supplements that contain the same nutrients you would find in the foods that come from animals. Talk with your health care provider or dietitian about whether you should be taking nutritional supplements. What do I need to know about the vegan diet? This diet includes all foods that come from plants. Some people choose not to eat sea vegetables, such as seaweed. This diet excludes all foods that come from animals. A vegan diet lacks certain nutrients that are commonly found in animal products. These nutrients include: Protein. Vitamin B12. Vitamin D. Iron. Omega-3 fatty acids. Calcium. Zinc. What foods can I eat? Fruits Any. Vegetables Any (except sea vegetables, such as kelp and seaweed, if you choose not to eat them). Grains Any. Meats and other proteins Beans, such as black beans or kidney beans. Other legumes, such as lentils and split peas. Soy products. Nuts, such as almonds, [place] nuts, and pecans. Seeds, such as sunflower seeds. Tofu. Tempeh. Hummus. Dairy Any dairy product that is made with milk  that comes from soy, almonds, rice, hemp, coconut, or any other type of nut. Fats and oils All vegetable-based oils, such as olive, canola, coconut, corn, safflower, peanut, and sesame oils. All vegan butters. Beverages Juice. Carbonated soft drinks. Tea. Sweets and desserts Any dessert that is labeled "vegan." Ice cream that is made with milk from soy, almond, rice, hemp, coconut, or other nuts and does not have other animal ingredients (such as chocolate chips that are made from cow milk). Vitamin B12 Breakfast cereals and prepared products that have added vitamin B12. Nutritional yeast. Vitamin  D Orange juice. Fortified mushrooms. Cereals with added vitamin D. Iron Dark leafy greens. Nuts. Beans. Grain products that have added iron, such as cereals. Tofu. Tempeh. Soybeans. Quinoa. Plant-based iron is absorbed better when it is eaten with a food that has vitamin C. Omega-3 fatty acids Walnuts. Foods with added omega-3 fatty acids, such as juices. Flax seeds. Hemp seeds. Canola oil and soybean oil. Tofu. Calcium Dark leafy greens, such as kale, bok choy, Chinese cabbage, collard greens, and mustard greens. Broccoli. Okra. Soy products with added calcium. Calcium-fortified breakfast cereals. Calcium-fortified fruit juices. Figs. Zinc Wheat germ, cereals, and breads that have added zinc. Baked beans. Legumes, such as cashews, chickpeas, kidney beans, and green peas. Almonds and nut butters. Tofu and other soy products. Seasonings and condiments Nutritional yeast. Salt. Pepper. Fresh herbs. Soy sauce. Vegetable broth. The items listed above may not be a complete list of foods and beverages you can eat. Contact a dietitian for more options. What foods should I avoid? Fruits No fruits need to be avoided. All fruits are included in this diet. Vegetables Sea vegetables, such as kelp and seaweed (if you choose not to eat them). Grains No grains need to be avoided. All grains are included in this diet. Meats and other proteins Any animal meats. Poultry. Eggs. Fish. Seafood. Dairy Milk, cheese, yogurt, or ice cream that is made from cow milk, goat milk, or sheep milk. Fats and oils Butter. Lard. Beverages Cow milk. Goat milk. Sheep milk. Drinks that are sweetened with honey. Sweets and desserts Any desserts that are made with eggs or animal milk. Honey. Cheesecake. Seasonings and condiments Honey. Any condiments made with eggs or animal milk. The items listed above may not be a complete list of foods and beverages to avoid. Contact a dietitian for more information. Summary A vegan  diet excludes any foods or drinks that come from an animal. A vegan diet may be followed for ethical or health reasons. When following this diet, you can become deficient in certain vitamins and minerals. These include vitamin B12, iron, calcium, and zinc. Talk with your health care provider or dietitian to make sure you get these important nutrients in your diet. This information is not intended to replace advice given to you by your health care provider. Make sure you discuss any questions you have with your health care provider. Document Revised: 05/15/2017 Document Reviewed: 05/15/2017 Elsevier Patient Education  2022 [employer].

## 2021-01-03 LAB — COMPREHENSIVE METABOLIC PANEL
ALT: 19 IU/L (ref 0–32)
AST: 18 IU/L (ref 0–40)
Albumin/Globulin Ratio: 1.5 (ref 1.2–2.2)
Albumin: 4.1 g/dL (ref 3.8–4.8)
Alkaline Phosphatase: 80 IU/L (ref 44–121)
BUN/Creatinine Ratio: 13 (ref 9–23)
BUN: 9 mg/dL (ref 6–24)
Bilirubin Total: <0.2 mg/dL (ref 0.0–1.2)
CO2: 25 mmol/L (ref 20–29)
Calcium: 9.3 mg/dL (ref 8.7–10.2)
Chloride: 101 mmol/L (ref 96–106)
Creatinine, Ser: 0.7 mg/dL (ref 0.57–1.00)
Globulin, Total: 2.8 g/dL (ref 1.5–4.5)
Glucose: 96 mg/dL (ref 65–99)
Potassium: 4.2 mmol/L (ref 3.5–5.2)
Sodium: 138 mmol/L (ref 134–144)
Total Protein: 6.9 g/dL (ref 6.0–8.5)
eGFR: 109 mL/min/{1.73_m2} (ref >59)

## 2021-01-03 LAB — CBC WITH DIFFERENTIAL/PLATELET
Basophils Absolute: 0 10*3/uL (ref 0.0–0.2)
Basos: 0 %
EOS (ABSOLUTE): 0.3 10*3/uL (ref 0.0–0.4)
Eos: 3 %
Hematocrit: 39.7 % (ref 34.0–46.6)
Hemoglobin: 12.6 g/dL (ref 11.1–15.9)
Immature Grans (Abs): 0 10*3/uL (ref 0.0–0.1)
Immature Granulocytes: 0 %
Lymphocytes Absolute: 1.9 10*3/uL (ref 0.7–3.1)
Lymphs: 24 %
MCH: 28.1 pg (ref 26.6–33.0)
MCHC: 31.7 g/dL (ref 31.5–35.7)
MCV: 88 fL (ref 79–97)
Monocytes Absolute: 0.8 10*3/uL (ref 0.1–0.9)
Monocytes: 10 %
Neutrophils Absolute: 4.7 10*3/uL (ref 1.4–7.0)
Neutrophils: 63 %
Platelets: 308 10*3/uL (ref 150–450)
RBC: 4.49 x10E6/uL (ref 3.77–5.28)
RDW: 12.9 % (ref 11.7–15.4)
WBC: 7.6 10*3/uL (ref 3.4–10.8)

## 2021-01-03 LAB — HIGH SENSITIVITY CRP: CRP, High Sensitivity: 0.57 mg/L (ref 0.00–3.00)

## 2021-01-03 LAB — LIPID PANEL
Chol/HDL Ratio: 3.7 ratio (ref 0.0–4.4)
Cholesterol, Total: 183 mg/dL (ref 100–199)
HDL: 50 mg/dL (ref >39)
LDL Chol Calc (NIH): 114 mg/dL — ABNORMAL HIGH (ref 0–99)
Triglycerides: 107 mg/dL (ref 0–149)
VLDL Cholesterol Cal: 19 mg/dL (ref 5–40)

## 2021-01-03 LAB — TSH: TSH: 1.02 u[IU]/mL (ref 0.450–4.500)

## 2021-01-03 LAB — VITAMIN D 25 HYDROXY (VIT D DEFICIENCY, FRACTURES): Vit D, 25-Hydroxy: 19.4 ng/mL — ABNORMAL LOW (ref 30.0–100.0)

## 2021-01-05 MED ORDER — VITAMIN D (ERGOCALCIFEROL) 1.25 MG (50000 UNIT) PO CAPS: 50000.0000 [IU] | ORAL_CAPSULE | ORAL | 0 refills | Status: DC

## 2021-01-05 NOTE — Addendum Note (Signed)
Addended by: Alysia Penna L on: 01/05/2021 04:05 PM   Modules accepted: Orders

## 2021-01-08 ENCOUNTER — Encounter: Payer: Self-pay | Admitting: Gastroenterology

## 2021-01-08 ENCOUNTER — Ambulatory Visit
Admission: RE | Admit: 2021-01-08 | Discharge: 2021-01-08 | Disposition: A | Discharge status: Home / Self Care | Payer: Managed Care, Other (non HMO) | Source: Ambulatory Visit | Attending: Nurse Practitioner | Admitting: Nurse Practitioner

## 2021-01-08 DIAGNOSIS — Z8742 Personal history of other diseases of the female genital tract: Secondary | ICD-10-CM

## 2021-01-08 DIAGNOSIS — R102 Pelvic and perineal pain: Secondary | ICD-10-CM

## 2021-01-29 ENCOUNTER — Ambulatory Visit
Admission: RE | Admit: 2021-01-29 | Discharge: 2021-01-29 | Disposition: A | Discharge status: Home / Self Care | Payer: Managed Care, Other (non HMO) | Source: Ambulatory Visit | Attending: Nurse Practitioner | Admitting: Nurse Practitioner

## 2021-01-29 ENCOUNTER — Other Ambulatory Visit: Payer: Self-pay

## 2021-01-29 DIAGNOSIS — Z1231 Encounter for screening mammogram for malignant neoplasm of breast: Secondary | ICD-10-CM

## 2021-02-01 ENCOUNTER — Other Ambulatory Visit: Payer: Self-pay | Admitting: Nurse Practitioner

## 2021-02-01 DIAGNOSIS — K219 Gastro-esophageal reflux disease without esophagitis: Secondary | ICD-10-CM

## 2021-02-06 ENCOUNTER — Ambulatory Visit (INDEPENDENT_AMBULATORY_CARE_PROVIDER_SITE_OTHER): Payer: Managed Care, Other (non HMO) | Admitting: Gastroenterology

## 2021-02-06 ENCOUNTER — Encounter: Payer: Self-pay | Admitting: Gastroenterology

## 2021-02-06 VITALS — BP 124/78 | HR 70 | Ht 66.0 in | Wt 218.0 lb

## 2021-02-06 DIAGNOSIS — Z1211 Encounter for screening for malignant neoplasm of colon: Secondary | ICD-10-CM

## 2021-02-06 DIAGNOSIS — R194 Change in bowel habit: Secondary | ICD-10-CM

## 2021-02-06 DIAGNOSIS — R1030 Lower abdominal pain, unspecified: Secondary | ICD-10-CM

## 2021-02-06 MED ORDER — PLENVU 140 G PO SOLR: 140.0000 g | ORAL | 0 refills | Status: DC

## 2021-02-06 NOTE — Progress Notes (Signed)
[facility] Consult Note:  History: Shelby Rios 02/06/2021  Referring provider: Flossie Buffy, NP  Reason for consult/chief complaint: Colon Cancer Screening (Lower abdominal pain, has ruled out gyn reasons. No nausea/vomiting/fever. She has made diet changes and that's helped. Has 2 bowel movements daily.)   Subjective  HPI: Recent primary care note reviewed and includes the following: "Pelvic pain Onset about 36month ago, intermittent pelvic pain with intercourse. Also complains of ABD bloating and stool frequency (no diarrhea), but this improved with dietary modification (vegan diet without gluten). S/p hysterectomy due to endometriosis, ovaries still present. FHx of ovarian cancer-mother at age 45  Pelvic ultrasound ordered and referral to GI for colon cancer screening.  LAalyssatells me that for about the last 3 to 6 months she has had a lower abdominal bandlike intermittent cramping.  For many years she has tended to have 3-6 loose BMs per day, but over the last few months with some diet changes (reducing/limiting gluten dairy and some other things), BMs are now down to 2 to 3/day and form is improved.  She denies rectal bleeding, but the lower abdominal pain was making her feel fatigued.  Denies cervical discharge, but was having dyspareunia, which still occurs intermittently.  Has had her gynecologic care with primary care, including Pap smears.  LMichaellawas seen here in October 2017 for epigastric pain that persisted despite PPI therapy and clearance of H. pylori treated by primary care.  Upper endoscopy normal at that time.  Right upper quadrant ultrasound normal as well.  ROS:  Review of Systems  Constitutional:  Negative for appetite change and unexpected weight change.  HENT:  Negative for mouth sores and voice change.   Eyes:  Negative for pain and redness.  Respiratory:  Negative for cough and shortness of breath.   Cardiovascular:   Negative for chest pain and palpitations.  Genitourinary:  Negative for dysuria and hematuria.  Musculoskeletal:  Negative for arthralgias and myalgias.  Skin:  Negative for pallor and rash.  Neurological:  Negative for weakness and headaches.  Hematological:  Negative for adenopathy.  Psychiatric/Behavioral:         Stable anxiety    Past Medical History: Past Medical History:  Diagnosis Date   Abnormal mammogram, unspecified    Abnormal weight gain    Allergy    Anemia    Anxiety    Back pain    Breast pain, right    Depression    Dyspepsia    Edema    Fatigue    GERD (gastroesophageal reflux disease)    H. pylori infection    Hypertension    only during pregnancy   Nonspecific abnormal results of other specified function study    Obesity    Palpitations    Routine general medical examination at a health care facility    Seizures ([facility]    couple seisure around age 45 tx with dilantin for a period then d/c   Sleep apnea    c-pap wears     Past Surgical History: Past Surgical History:  Procedure Laterality Date   adenoidectomy     BRCA test     neg   DILATION AND CURETTAGE OF UTERUS     PARTIAL HYSTERECTOMY  04/22/2006   endometriosis, done vaginally, she has her ovaries   TONSILLECTOMY     WISDOM TOOTH EXTRACTION       Family History: Family History  Problem Relation Age of Onset   Ovarian cancer  Mother    Stroke Mother 42   Diabetes Mother    Seizures Mother    Depression Mother    Hypertension Mother    Obesity Mother    Colon polyps Mother    Irritable bowel syndrome Mother    Heart disease Father        CABG age 41   Diabetes Father    Depression Father    Early death Father    Hypertension Father    Obesity Father    Hypokalemia Sister        deceased at 28; was getting regular IV K infusions; long Hx of pseudotumor cerebri   Early death Sister    Breast cancer Maternal Grandmother    Heart attack Other        GF - 35   Coronary  artery disease Other        family hx   Hypertension Other        family hx   Diabetes Other        family hx   Colon cancer Neg Hx    Esophageal cancer Neg Hx    Pancreatic cancer Neg Hx    Rectal cancer Neg Hx    Stomach cancer Neg Hx     Social History: Social History   Socioeconomic History   Marital status: Married    Spouse name: Kim   Number of children: 2   Years of education: Not on file   Highest education level: Bachelor's degree (e.g., BA, AB, BS)  Occupational History   Occupation: financial analyst    Employer: [employer]  Tobacco Use   Smoking status: Never   Smokeless tobacco: Never   Tobacco comments:    nonsmoker  Vaping Use   Vaping Use: Never used  Substance and Sexual Activity   Alcohol use: No   Drug use: No   Sexual activity: Yes    Birth control/protection: Surgical    Comment: hysterectomy  Other Topics Concern   Not on file  Social History Narrative   Divorced since 9/08; 2 children   3 coffees/day       Patient is right-handed. She lives with her husband in a one level home. She drinks 4-6 cups of coffee a day, and 1-2 sodas a week. She participates in boot camp 3 x week and hot yoga 3 x a week, each for 1 hour.   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     ALPRAZolam (XANAX) 0.25 MG tablet Take 1 tablet (0.25 mg total) by mouth 2 (two) times daily as needed for anxiety or sleep. 60 tablet 0   Ascorbic Acid (VITAMIN C PO) Take by mouth.     fluticasone (FLONASE) 50 MCG/ACT nasal spray SPRAY 2 SPRAYS INTO  EACH NOSTRIL EVERY DAY 16 g 0   pantoprazole (PROTONIX) 20 MG tablet TAKE 1 TABLET BY MOUTH  DAILY AS NEEDED 90 tablet 0   PEG-KCl-NaCl-NaSulf-Na Asc-C (PLENVU) 140 g SOLR Take 140 g by mouth as directed. Manufacturer's coupon Universal coupon code:BIN: 019158;   GROUP: [id number]; PCN: [id number]; ID: [id number]; PAY NO MORE $50 1 each 0   rizatriptan (MAXALT-MLT) 10 MG disintegrating tablet TAKE 1 TABLET BY MOUTH AS NEEDED FOR MIGRAINE. MAY REPEAT IN 2HRS IF NEEDED. MAX 2 TABS/24HRS 9 tablet 3   sertraline (ZOLOFT) 50 MG tablet Take 1 tablet (50 mg total) by mouth daily. Maintain upcoming appt for additional refills 90 tablet 3   vitamin B-12 (CYANOCOBALAMIN) 100 MCG tablet Take 100 mcg by mouth daily.     Vitamin D, Ergocalciferol, (DRISDOL) 1.25 MG (50000 UNIT) CAPS capsule Take 1 capsule (50,000 Units total) by mouth every 7 (seven) days. 12 capsule 0   ZINC OXIDE PO Take by mouth.     No current facility-administered medications for this visit.      ___________________________________________________________________ Objective   Exam:  BP 124/78   Pulse 70   Ht 5' 6" (1.676 m)   Wt 218 lb (98.9 kg)   LMP 07/22/2006   BMI 35.19 kg/m  Wt Readings from Last 3 Encounters:  02/06/21 218 lb (98.9 kg)  01/01/21 214 lb (97.1 kg)  07/03/20 216 lb 9.6 oz (98.2 kg)    General: Well-appearing Eyes: sclera anicteric, no redness ENT: oral mucosa moist without lesions, no cervical or supraclavicular lymphadenopathy CV: RRR without murmur, S1/S2, no JVD, no peripheral edema Resp: clear to auscultation bilaterally, normal RR and effort noted GI: soft, no tenderness, with active bowel sounds. No guarding or palpable organomegaly noted. Skin; warm and dry, no rash or jaundice noted Neuro: awake, alert and oriented x 3. Normal gross motor function and fluent speech  Labs:    Radiologic Studies:  CLINICAL DATA:  Pelvic pain with intercourse, dyspareunia, history of endometriosis    EXAM: TRANSABDOMINAL AND TRANSVAGINAL ULTRASOUND OF PELVIS   TECHNIQUE: Both transabdominal and transvaginal ultrasound examinations of the pelvis were performed. Transabdominal technique was performed for global imaging of the pelvis including uterus, ovaries, adnexal regions, and pelvic cul-de-sac. It was necessary to proceed with endovaginal exam following the transabdominal exam to visualize the adnexa.   COMPARISON:  10/07/2016   FINDINGS: Uterus   Surgically absent   Endometrium   Surgically absent   Right ovary   Measurements: 2.5 x 1.5 x 2.1 cm = volume: 10.4 mL. Normal morphology without mass   Left ovary   Measurements: 2.0 x 1.5 x 1.6 cm = volume: 2.6 mL. Normal morphology without mass   Other findings   Soft tissue at vaginal cuff likely unremarkable retained cervix. No free pelvic fluid or adnexal masses.   IMPRESSION: Post hysterectomy with unremarkable ovaries.   No pelvic sonographic abnormalities identified.     Electronically Signed   By: Lavonia Dana M.D.   On: 01/08/2021 16:40   Assessment: Encounter Diagnoses  Name Primary?   Lower abdominal pain Yes   Special screening for malignant neoplasms, colon    Altered bowel habits     Possibly IBS with some maldigestion triggers that she has lately identified.  Additional food guidelines given regarding that. Some elements still sound gynecologic in nature, particularly the dyspareunia.  Plan:  Screening colonoscopy.  Additional biopsies will be taken to rule out microscopic colitis. After that, she might consider a trial of antispasmodic medicine depending on how much the symptoms bother her. Consider gynecologic evaluation after that.  Thank you for the courtesy of this consult.  Please call me with any questions or concerns.  Nelida Meuse III  CC: Referring provider noted above

## 2021-02-06 NOTE — Patient Instructions (Addendum)
If you are age 45 or older, your body mass index should be between 23-30. Your Body mass index is 35.19 kg/m. If this is out of the aforementioned range listed, please consider follow up with your Primary Care Provider.  If you are age 95 or younger, your body mass index should be between 19-25. Your Body mass index is 35.19 kg/m. If this is out of the aformentioned range listed, please consider follow up with your Primary Care Provider.   __________________________________________________________  The [facility] providers would like to encourage you to use [facility] to communicate with providers for non-urgent requests or questions.  Due to long hold times on the telephone, sending your provider a message by [facility] may be a faster and more efficient way to get a response.  Please allow 48 business hours for a response.  Please remember that this is for non-urgent requests.    You have been scheduled for a colonoscopy. Please follow written instructions given to you at your visit today.  Please pick up your prep supplies at the pharmacy within the next 1-3 days. If you use inhalers (even only as needed), please bring them with you on the day of your procedure.  _______________________________________________________  Food Guidelines for those with chronic digestive trouble:  Many people have difficulty digesting certain foods, causing a variety of distressing and embarrassing symptoms such as abdominal pain, bloating and gas.  These foods may need to be avoided or consumed in small amounts.  Here are some tips that might be helpful for you.  1.   Lactose intolerance is the difficulty or complete inability to digest lactose, the natural sugar in milk and anything made from milk.  This condition is harmless, common, and can begin any time during life.  Some people can digest a modest amount of lactose while others cannot tolerate any.  Also, not all dairy products contain equal amounts of lactose.   For example, hard cheeses such as parmesan have less lactose than soft cheeses such as cheddar.  Yogurt has less lactose than milk or cheese.  Many packaged foods (even many brands of bread) have milk, so read ingredient lists carefully.  It is difficult to test for lactose intolerance, so just try avoiding lactose as much as possible for a week and see what happens with your symptoms.  If you seem to be lactose intolerant, the best plan is to avoid it (but make sure you get calcium from another source).  The next best thing is to use lactase enzyme supplements, available over the counter everywhere.  Just know that many lactose intolerant people need to take several tablets with each serving of dairy to avoid symptoms.  Lastly, a lot of restaurant food is made with milk or butter.  Many are things you might not suspect, such as mashed potatoes, rice and pasta (cooked with butter) and "grilled" items.  If you are lactose intolerant, it never hurts to ask your server what has milk or butter.  2.   Fiber is an important part of your diet, but not all fiber is well-tolerated.  Insoluble fiber such as bran is often consumed by normal gut bacteria and converted into gas.  Soluble fiber such as oats, squash, carrots and green beans are typically tolerated better.  3.   Some types of carbohydrates can be poorly digested.  Examples include: fructose (apples, cherries, pears, raisins and other dried fruits), fructans (onions, zucchini, large amounts of wheat), sorbitol/mannitol/xylitol and sucralose/Splenda (common artificial sweeteners),  and raffinose (lentils, broccoli, cabbage, asparagus, brussel sprouts, many types of beans).  Do a Programmer, multimedia for [employer] and you will find helpful information. Beano, a dietary supplement, will often help with raffinose-containing foods.  As with lactase tablets, you may need several per serving.  4.   Whenever possible, avoid processed food&meats and chemical additives.  High  fructose corn syrup, a common sweetener, may be difficult to digest.  Eggs and soy (comes from the soybean, and added to many foods now) are other common bloating/gassy foods.  5.  Regarding gluten:  gluten is a protein mainly found in wheat, but also rye and barley.  There is a condition called celiac sprue, which is an inflammatory reaction in the small intestine causing a variety of digestive symptoms.  Blood testing is highly reliable to look for this condition, and sometimes upper endoscopy with small bowel biopsies may be necessary to make the diagnosis.  Many patients who test negative for celiac sprue report improvement in their digestive symptoms when they switch to a gluten-free diet.  However, in these "non-celiac gluten sensitive" patients, the true role of gluten in their symptoms is unclear.  Reducing carbohydrates in general may decrease the gas and bloating caused when gut bacteria consume carbs. Also, some of these patients may actually be intolerant of the baker's yeast in bread products rather than the gluten.  Flatbread and other reduced yeast breads might therefore be tolerated.  There is no specific testing available for most food intolerances, which are discovered mainly by dietary elimination.  Please do not embark on a gluten free diet unless directed by your doctor, as it is highly restrictive, and may lead to nutritional deficiencies if not carefully monitored.  Lastly, beware of internet claims offering "personalized" tests for food intolerances.  Such testing has no reliable scientific evidence to support its reliability and correlation to symptoms.    6.  The best advice is old advice, especially for those with chronic digestive trouble - try to eat "clean".  Balanced diet, avoid processed food, plenty of fruits and vegetables, cut down the sugar, minimal alcohol, avoid tobacco. Make time to care for yourself, get enough sleep, exercise when you can, reduce stress.  Your guts will  thank you for it.   - Dr. [facility]  ____________________________________________________________

## 2021-03-02 ENCOUNTER — Encounter: Payer: Self-pay | Admitting: Gastroenterology

## 2021-03-09 ENCOUNTER — Other Ambulatory Visit: Payer: Self-pay

## 2021-03-09 ENCOUNTER — Encounter: Payer: Self-pay | Admitting: Gastroenterology

## 2021-03-09 ENCOUNTER — Ambulatory Visit (AMBULATORY_SURGERY_CENTER): Payer: Managed Care, Other (non HMO) | Admitting: Gastroenterology

## 2021-03-09 VITALS — BP 119/75 | HR 59 | Temp 97.1°F | Resp 14 | Ht 66.0 in | Wt 218.0 lb

## 2021-03-09 DIAGNOSIS — R197 Diarrhea, unspecified: Secondary | ICD-10-CM

## 2021-03-09 DIAGNOSIS — Z1211 Encounter for screening for malignant neoplasm of colon: Secondary | ICD-10-CM

## 2021-03-09 MED ORDER — SODIUM CHLORIDE 0.9 % IV SOLN: 500.0000 mL | INTRAVENOUS | Status: DC

## 2021-03-09 NOTE — Progress Notes (Signed)
Called to room to assist during endoscopic procedure.  Patient ID and intended procedure confirmed with present staff. Received instructions for my participation in the procedure from the performing physician.  

## 2021-03-09 NOTE — Progress Notes (Signed)
History and Physical:  This patient presents for endoscopic testing for: Encounter Diagnosis  Name Primary?   Special screening for malignant neoplasms, colon Yes    Clinical details in 02/06/21 office note  ROS: Patient denies chest pain or cough   Past Medical History: Past Medical History:  Diagnosis Date   Abnormal mammogram, unspecified    Abnormal weight gain    Allergy    Anemia    Anxiety    Back pain    Breast pain, right    Depression    Dyspepsia    Edema    Fatigue    GERD (gastroesophageal reflux disease)    H. pylori infection    Hypertension    only during pregnancy   Nonspecific abnormal results of other specified function study    Obesity    Palpitations    Routine general medical examination at a health care facility    Seizures [facility])    couple seisure around age 41- tx with dilantin for a period then d/c   Sleep apnea    c-pap wears     Past Surgical History: Past Surgical History:  Procedure Laterality Date   adenoidectomy     BRCA test     neg   DILATION AND CURETTAGE OF UTERUS     PARTIAL HYSTERECTOMY  04/22/2006   endometriosis, done vaginally, she has her ovaries   TONSILLECTOMY     WISDOM TOOTH EXTRACTION      Allergies: No Known Allergies  Outpatient Meds: Current Outpatient Medications  Medication Sig Dispense Refill   Ascorbic Acid (VITAMIN C PO) Take by mouth.     PEG-KCl-NaCl-NaSulf-Na Asc-C (PLENVU) 140 g SOLR Take 140 g by mouth as directed. Manufacturer's coupon Universal coupon code:BIN: P2366821; GROUP: [id number]; PCN: [id number]; ID: [id number]; PAY NO MORE $50 1 each 0   sertraline (ZOLOFT) 50 MG tablet Take 1 tablet (50 mg total) by mouth daily. Maintain upcoming appt for additional refills 90 tablet 3   vitamin B-12 (CYANOCOBALAMIN) 100 MCG tablet Take 100 mcg by mouth daily.     Vitamin D, Ergocalciferol, (DRISDOL) 1.25 MG (50000 UNIT) CAPS capsule Take 1 capsule (50,000 Units total) by mouth every 7 (seven) days. 12  capsule 0   ZINC OXIDE PO Take by mouth.     acyclovir (ZOVIRAX) 800 MG tablet Take 800 mg by mouth 3 (three) times daily.     ALPRAZolam (XANAX) 0.25 MG tablet Take 1 tablet (0.25 mg total) by mouth 2 (two) times daily as needed for anxiety or sleep. 60 tablet 0   fluticasone (FLONASE) 50 MCG/ACT nasal spray SPRAY 2 SPRAYS INTO EACH NOSTRIL EVERY DAY 16 g 0   gatifloxacin (ZYMAXID) 0.5 % SOLN 1 drop 3 (three) times daily. (Patient not taking: Reported on 03/09/2021)     LOTEMAX 0.5 % ophthalmic suspension 1 drop 3 (three) times daily. (Patient not taking: Reported on 03/09/2021)     pantoprazole (PROTONIX) 20 MG tablet TAKE 1 TABLET BY MOUTH  DAILY AS NEEDED 90 tablet 0   rizatriptan (MAXALT-MLT) 10 MG disintegrating tablet TAKE 1 TABLET BY MOUTH AS NEEDED FOR MIGRAINE. MAY REPEAT IN 2HRS IF NEEDED. MAX 2 TABS/24HRS 9 tablet 3   Current Facility-Administered Medications  Medication Dose Route Frequency Provider Last Rate Last Admin   0.9 %  sodium chloride infusion  500 mL Intravenous Continuous Danis, Kirke Corin, MD          ___________________________________________________________________ Objective   Exam:  BP 123/80  Pulse 63   Temp (!) 97.1 F (36.2 C) (Temporal)   Resp 14   Ht 5' 6" (1.676 m)   Wt 218 lb (98.9 kg)   LMP 07/22/2006   SpO2 100%   BMI 35.19 kg/m   CV: RRR without murmur, S1/S2 Resp: clear to auscultation bilaterally, normal RR and effort noted GI: soft, no tenderness, with active bowel sounds.   Assessment: Encounter Diagnosis  Name Primary?   Special screening for malignant neoplasms, colon Yes     Plan: Colonoscopy (Bx to r/o microscopic coilitis)   The patient is appropriate for an endoscopic procedure in the ambulatory setting.   - Wilfrid Lund, MD

## 2021-03-09 NOTE — Patient Instructions (Signed)
YOU HAD AN ENDOSCOPIC PROCEDURE TODAY AT THE [facility]:   Refer to the procedure report that was given to you for any specific questions about what was found during the examination.  If the procedure report does not answer your questions, please call your gastroenterologist to clarify.  If you requested that your care partner not be given the details of your procedure findings, then the procedure report has been included in a sealed envelope for you to review at your convenience later.  YOU SHOULD EXPECT: Some feelings of bloating in the abdomen. Passage of more gas than usual.  Walking can help get rid of the air that was put into your GI tract during the procedure and reduce the bloating. If you had a lower endoscopy (such as a colonoscopy or flexible sigmoidoscopy) you may notice spotting of blood in your stool or on the toilet paper. If you underwent a bowel prep for your procedure, you may not have a normal bowel movement for a few days.  Please Note:  You might notice some irritation and congestion in your nose or some drainage.  This is from the oxygen used during your procedure.  There is no need for concern and it should clear up in a day or so.  SYMPTOMS TO REPORT IMMEDIATELY:   Following lower endoscopy (colonoscopy or flexible sigmoidoscopy):  Excessive amounts of blood in the stool  Significant tenderness or worsening of abdominal pains  Swelling of the abdomen that is new, acute  Fever of 100F or higher  For urgent or emergent issues, a gastroenterologist can be reached at any hour by calling [phone]. Do not use MyChart messaging for urgent concerns.    DIET:  We do recommend a small meal at first, but then you may proceed to your regular diet.  Drink plenty of fluids but you should avoid alcoholic beverages for 24 hours.  ACTIVITY:  You should plan to take it easy for the rest of today and you should NOT DRIVE or use heavy machinery until tomorrow (because  of the sedation medicines used during the test).    FOLLOW UP: Our staff will call the number listed on your records 48-72 hours following your procedure to check on you and address any questions or concerns that you may have regarding the information given to you following your procedure. If we do not reach you, we will leave a message.  We will attempt to reach you two times.  During this call, we will ask if you have developed any symptoms of COVID 19. If you develop any symptoms (ie: fever, flu-like symptoms, shortness of breath, cough etc.) before then, please call [phone].  If you test positive for Covid 19 in the 2 weeks post procedure, please call and report this information to us.    If any biopsies were taken you will be contacted by phone or by letter within the next 1-3 weeks.  Please call us at [phone] if you have not heard about the biopsies in 3 weeks.    SIGNATURES/CONFIDENTIALITY: You and/or your care partner have signed paperwork which will be entered into your electronic medical record.  These signatures attest to the fact that that the information above on your After Visit Summary has been reviewed and is understood.  Full responsibility of the confidentiality of this discharge information lies with you and/or your care-partner. 

## 2021-03-09 NOTE — Op Note (Signed)
[facility] Patient Name: Grindle Cantarero Procedure Date: 03/09/2021 2:20 PM MRN: [id number] Endoscopist: Mallie Mussel L. Loletha Carrow , MD Age: 45 Referring MD:  Date of Birth: [birth year] Gender: Female Account #: [id number] Procedure:                Colonoscopy Indications:              Screening for colorectal malignant neoplasm, This                            is the patient's first colonoscopy, Incidental                            diarrhea noted Medicines:                Monitored Anesthesia Care Procedure:                Pre-Anesthesia Assessment:                           - Prior to the procedure, a History and Physical                            was performed, and patient medications and                            allergies were reviewed. The patient's tolerance of                            previous anesthesia was also reviewed. The risks                            and benefits of the procedure and the sedation                            options and risks were discussed with the patient.                            All questions were answered, and informed consent                            was obtained. Prior Anticoagulants: The patient has                            taken no previous anticoagulant or antiplatelet                            agents. ASA Grade Assessment: II - A patient with                            mild systemic disease. After reviewing the risks                            and benefits, the patient was deemed in  satisfactory condition to undergo the procedure.                           After obtaining informed consent, the colonoscope                            was passed under direct vision. Throughout the                            procedure, the patient's blood pressure, pulse, and                            oxygen saturations were monitored continuously. The                            CF [id number] was introduced through the anus                             and advanced to the the terminal ileum, with                            identification of the appendiceal orifice and IC                            valve. The colonoscopy was performed without                            difficulty. The patient tolerated the procedure                            well. The quality of the bowel preparation was                            excellent. The terminal ileum, ileocecal valve,                            appendiceal orifice, and rectum were photographed. Scope In: 2:33:13 PM Scope Out: 2:44:30 PM Scope Withdrawal Time: 0 hours 8 minutes 42 seconds  Total Procedure Duration: 0 hours 11 minutes 17 seconds  Findings:                 The perianal and digital rectal examinations were                            normal.                           The terminal ileum appeared normal.                           Normal mucosa was found in the entire colon.                            Biopsies for histology were taken with a cold  forceps from the right colon and left colon for                            evaluation of microscopic colitis.                           There is no endoscopic evidence of polyps in the                            entire colon. Complications:            No immediate complications. Estimated Blood Loss:     Estimated blood loss was minimal. Impression:               - The examined portion of the ileum was normal.                           - Normal mucosa in the entire examined colon.                            Biopsied. Recommendation:           - Patient has a contact number available for                            emergencies. The signs and symptoms of potential                            delayed complications were discussed with the                            patient. Return to normal activities tomorrow.                            Written discharge instructions were provided to the                             patient.                           - Resume previous diet.                           - Continue present medications.                           - Await pathology results.                           - Repeat colonoscopy in 10 years for screening                            purposes. Amethyst Gainer L. Myrtie Neither, MD 03/09/2021 2:51:12 PM This report has been signed electronically.

## 2021-03-09 NOTE — Progress Notes (Signed)
Pt's states no medical or surgical changes since previsit or office visit. 

## 2021-03-13 ENCOUNTER — Telehealth: Payer: Self-pay

## 2021-03-13 NOTE — Telephone Encounter (Signed)
  Follow up Call-  Call back number 03/09/2021  Post procedure Call Back phone  # [phone]  Permission to leave phone message Yes  Some recent data might be hidden     Patient questions:  Do you have a fever, pain , or abdominal swelling? No. Pain Score  0 *  Have you tolerated food without any problems? Yes.    Have you been able to return to your normal activities? Yes.    Do you have any questions about your discharge instructions: Diet   No. Medications  No. Follow up visit  No.  Do you have questions or concerns about your Care? No.  Actions: * If pain score is 4 or above: No action needed, pain <4.

## 2021-03-20 ENCOUNTER — Encounter: Payer: Self-pay | Admitting: Gastroenterology

## 2021-05-14 ENCOUNTER — Telehealth (INDEPENDENT_AMBULATORY_CARE_PROVIDER_SITE_OTHER): Payer: Managed Care, Other (non HMO) | Admitting: Nurse Practitioner

## 2021-05-14 ENCOUNTER — Encounter: Payer: Self-pay | Admitting: Nurse Practitioner

## 2021-05-14 VITALS — BP 107/85 | Temp 98.9°F | Ht 66.0 in | Wt 211.0 lb

## 2021-05-14 DIAGNOSIS — J0140 Acute pansinusitis, unspecified: Secondary | ICD-10-CM

## 2021-05-14 MED ORDER — DOXYCYCLINE HYCLATE 100 MG PO TABS: 100.0000 mg | ORAL_TABLET | Freq: Two times a day (BID) | ORAL | 0 refills | Status: AC

## 2021-05-14 NOTE — Progress Notes (Signed)
Virtual Visit via Video Note  I connected withNAME@ on 05/14/21 at 11:00 AM EST by a video enabled telemedicine application and verified that I am speaking with the correct person using two identifiers.  Location: Patient:Home Provider: Office Participants: patient and provider  I discussed the limitations of evaluation and management by telemedicine and the availability of in person appointments. I also discussed with the patient that there may be a patient responsible charge related to this service. The patient expressed understanding and agreed to proceed.  CC:Pt c/o nasal congestion, body aches, headaches, nausea, and sore throat x 1.5 weeks. Pt has took otc medication (Mucinex DM and nasal spray) with no relief. Pt states today she feels worse than she did in the beginning.  Pt took at home COVID test on 05/09/21 and again on today and both test were negative.   History of Present Illness: URI  This is a new problem. The current episode started 1 to 4 weeks ago. The problem has been gradually worsening. There has been no fever. Associated symptoms include congestion, ear pain, headaches, joint pain, a plugged ear sensation, rhinorrhea, sinus pain and a sore throat. Pertinent negatives include no abdominal pain, chest pain, coughing, diarrhea, dysuria, joint swelling, neck pain, swollen glands, vomiting or wheezing. Associated symptoms comments: Nausea due to lack of food. She has tried acetaminophen and increased fluids for the symptoms. The treatment provided no relief.    no recent travel Sick contact: college student-daughter.  Observations/Objective: Physical Exam Constitutional:      General: She is not in acute distress. Pulmonary:     Effort: Pulmonary effort is normal.  Neurological:     Mental Status: She is alert and oriented to person, place, and time.    Assessment and Plan: Shelby Rios was seen today for acute visit.  Diagnoses and all orders for this visit:  Acute  non-recurrent pansinusitis -     doxycycline (VIBRA-TABS) 100 MG tablet; Take 1 tablet (100 mg total) by mouth 2 (two) times daily for 7 days.   Follow Up Instructions: Flonase use: apply 2sprays of flonase in each nare.  Encourage adequate oral hydration. Use over-the-counter  "cold" medicines  such as "Tylenol cold" , "Advil cold",  "Mucinex" or" Mucinex D"  for cough and congestion.  Avoid decongestants if you have high blood pressure.  You can use plain "Tylenol" or "Advi"l for fever, chills and achyness. Call office if no improvement in 1week.  I discussed the assessment and treatment plan with the patient. The patient was provided an opportunity to ask questions and all were answered. The patient agreed with the plan and demonstrated an understanding of the instructions.   The patient was advised to call back or seek an in-person evaluation if the symptoms worsen or if the condition fails to improve as anticipated.  Alysia Penna, NP

## 2021-05-14 NOTE — Patient Instructions (Signed)
Flonase use: apply 2sprays of flonase in each nare.  Encourage adequate oral hydration. Use over-the-counter  "cold" medicines  such as "Tylenol cold" , "Advil cold",  "Mucinex" or" Mucinex D"  for cough and congestion.  Avoid decongestants if you have high blood pressure.  You can use plain "Tylenol" or "Advi"l for fever, chills and achyness. Call office if no improvement in 1week.

## 2021-05-17 ENCOUNTER — Ambulatory Visit (INDEPENDENT_AMBULATORY_CARE_PROVIDER_SITE_OTHER): Payer: Managed Care, Other (non HMO) | Admitting: Nurse Practitioner

## 2021-05-17 ENCOUNTER — Encounter: Payer: Self-pay | Admitting: Nurse Practitioner

## 2021-05-17 ENCOUNTER — Other Ambulatory Visit: Payer: Self-pay

## 2021-05-17 VITALS — BP 130/90 | HR 69 | Temp 97.6°F | Ht 66.0 in | Wt 213.0 lb

## 2021-05-17 DIAGNOSIS — J0110 Acute frontal sinusitis, unspecified: Secondary | ICD-10-CM | POA: Diagnosis not present

## 2021-05-17 LAB — POCT INFLUENZA A/B
Influenza A, POC: NEGATIVE
Influenza B, POC: NEGATIVE

## 2021-05-17 MED ORDER — PREDNISONE 10 MG PO TABS: ORAL_TABLET | ORAL | 0 refills | Status: DC

## 2021-05-17 NOTE — Progress Notes (Signed)
Acute Office Visit  Subjective:    Patient ID: Shelby Rios, female    DOB: Nov 09, 1975, 46 y.o.   MRN: [id number]  Chief Complaint  Patient presents with   URI    Pt c/o sinus pressure w/ pain, congestion in both ears, chest congestion x2-3 weeks. Covid test neg.     HPI Patient is in today for ongoing sinus pressure, cough, and  ear pain. Symptoms started a few weeks ago with a cough. Then on last Monday, she developed sore throat, body aches, and ear fullness.   UPPER RESPIRATORY TRACT INFECTION  Worst symptom: Fever: no, however has had chills and sweating Cough: yes - improving Shortness of breath: no Wheezing: no Chest pain: no Chest tightness: no Chest congestion: no Nasal congestion:  comes and goes Runny nose: no Post nasal drip: no Sneezing: no Sore throat: yes Swollen glands: no Sinus pressure: yes Headache: yes Face pain: no Toothache: no Ear pain: no bilateral Ear pressure: yes "right Eyes red/itching:no Eye drainage/crusting: no  Vomiting: no - endorses nausea Rash: no Fatigue: yes Sick contacts: yes - daughter when she came home for christmas  Strep contacts: no  Context: fluctuating Recurrent sinusitis: no Relief with OTC cold/cough medications: yes  Treatments attempted: mucinex, doxycycline, tessalon perles, advil, saline nasal spray,vitamin C   Past Medical History:  Diagnosis Date   Abnormal mammogram, unspecified    Abnormal weight gain    Allergy    Anemia    Anxiety    Back pain    Breast pain, right    Depression    Dyspepsia    Edema    Fatigue    GERD (gastroesophageal reflux disease)    H. pylori infection    Hypertension    only during pregnancy   Nonspecific abnormal results of other specified function study    Obesity    Palpitations    Routine general medical examination at a health care facility    Seizures [facility])    couple seisure around age 89- tx with dilantin for a period then d/c   Sleep apnea    c-pap  wears    Past Surgical History:  Procedure Laterality Date   adenoidectomy     BRCA test     neg   DILATION AND CURETTAGE OF UTERUS     PARTIAL HYSTERECTOMY  04/22/2006   endometriosis, done vaginally, she has her ovaries   TONSILLECTOMY     WISDOM TOOTH EXTRACTION      Family History  Problem Relation Age of Onset   Ovarian cancer Mother    Stroke Mother 55   Diabetes Mother    Seizures Mother    Depression Mother    Hypertension Mother    Obesity Mother    Colon polyps Mother    Irritable bowel syndrome Mother    Heart disease Father        CABG age 86   Diabetes Father    Depression Father    Early death Father    Hypertension Father    Obesity Father    Hypokalemia Sister        deceased at 75; was getting regular IV K infusions; long Hx of pseudotumor cerebri   Early death Sister    Breast cancer Maternal Grandmother    Heart attack Other        GF - 35   Coronary artery disease Other        family hx   Hypertension  Other        family hx   Diabetes Other        family hx   Colon cancer Neg Hx    Esophageal cancer Neg Hx    Pancreatic cancer Neg Hx    Rectal cancer Neg Hx    Stomach cancer Neg Hx     Social History   Socioeconomic History   Marital status: Married    Spouse name: Maudie Mercury   Number of children: 2   Years of education: Not on file   Highest education level: Bachelor's degree (e.g., BA, AB, BS)  Occupational History   Occupation: Primary school teacher: [employer]  Tobacco Use   Smoking status: Never   Smokeless tobacco: Never   Tobacco comments:    nonsmoker  Vaping Use   Vaping Use: Never used  Substance and Sexual Activity   Alcohol use: No   Drug use: No   Sexual activity: Yes    Birth control/protection: Surgical    Comment: hysterectomy  Other Topics Concern   Not on file  Social History Narrative   Divorced since 9/08; 2 children   3 coffees/day       Patient is right-handed. She lives with her husband in a  one level home. She drinks 4-6 cups of coffee a day, and 1-2 sodas a week. She participates in boot camp 3 x week and hot yoga 3 x a week, each for 1 hour.   Social Determinants of Health   Financial Resource Strain: Not on file  Food Insecurity: Not on file  Transportation Needs: Not on file  Physical Activity: Not on file  Stress: Not on file  Social Connections: Not on file  Intimate Partner Violence: Not on file    Outpatient Medications Prior to Visit  Medication Sig Dispense Refill   acyclovir (ZOVIRAX) 800 MG tablet Take 800 mg by mouth 3 (three) times daily.     ALPRAZolam (XANAX) 0.25 MG tablet Take 1 tablet (0.25 mg total) by mouth 2 (two) times daily as needed for anxiety or sleep. 60 tablet 0   Ascorbic Acid (VITAMIN C PO) Take by mouth.     doxycycline (VIBRA-TABS) 100 MG tablet Take 1 tablet (100 mg total) by mouth 2 (two) times daily for 7 days. 14 tablet 0   pantoprazole (PROTONIX) 20 MG tablet TAKE 1 TABLET BY MOUTH  DAILY AS NEEDED 90 tablet 3   rizatriptan (MAXALT-MLT) 10 MG disintegrating tablet TAKE 1 TABLET BY MOUTH AS NEEDED FOR MIGRAINE. MAY REPEAT IN 2HRS IF NEEDED. MAX 2 TABS/24HRS 9 tablet 3   sertraline (ZOLOFT) 50 MG tablet Take 1 tablet (50 mg total) by mouth daily. Maintain upcoming appt for additional refills 90 tablet 3   vitamin B-12 (CYANOCOBALAMIN) 100 MCG tablet Take 100 mcg by mouth daily.     Vitamin D, Ergocalciferol, (DRISDOL) 1.25 MG (50000 UNIT) CAPS capsule Take 1 capsule (50,000 Units total) by mouth every 7 (seven) days. 12 capsule 0   ZINC OXIDE PO Take by mouth.     fluticasone (FLONASE) 50 MCG/ACT nasal spray SPRAY 2 SPRAYS INTO EACH NOSTRIL EVERY DAY 16 g 0   No facility-administered medications prior to visit.    No Known Allergies  Review of Systems  See pertinent positives and negatives per HPI.    Objective:    Physical Exam Vitals and nursing note reviewed.  Constitutional:      General: She is not in acute distress.  Appearance: Normal appearance.  HENT:     Head: Normocephalic.     Right Ear: Ear canal and external ear normal. A middle ear effusion is present. Tympanic membrane is not erythematous.     Left Ear: Tympanic membrane, ear canal and external ear normal.  Eyes:     Conjunctiva/sclera: Conjunctivae normal.  Cardiovascular:     Rate and Rhythm: Normal rate and regular rhythm.     Pulses: Normal pulses.     Heart sounds: Normal heart sounds.  Pulmonary:     Effort: Pulmonary effort is normal.     Breath sounds: Normal breath sounds.  Musculoskeletal:     Cervical back: Normal range of motion. No tenderness.  Lymphadenopathy:     Cervical: No cervical adenopathy.  Skin:    General: Skin is warm.  Neurological:     General: No focal deficit present.     Mental Status: She is alert and oriented to person, place, and time.  Psychiatric:        Mood and Affect: Mood normal.        Behavior: Behavior normal.        Thought Content: Thought content normal.        Judgment: Judgment normal.    BP 130/90    Pulse 69    Temp 97.6 F (36.4 C) (Temporal)    Ht 5' 6" (1.676 m)    Wt 213 lb (96.6 kg)    LMP 07/22/2006    SpO2 97%    BMI 34.38 kg/m  Wt Readings from Last 3 Encounters:  05/17/21 213 lb (96.6 kg)  05/14/21 211 lb (95.7 kg)  03/09/21 218 lb (98.9 kg)    Health Maintenance Due  Topic Date Due   COVID-19 Vaccine (3 - Booster for Moderna series) 08/20/2019    There are no preventive care reminders to display for this patient.   Lab Results  Component Value Date   TSH 1.020 01/01/2021   Lab Results  Component Value Date   WBC 7.6 01/01/2021   HGB 12.6 01/01/2021   HCT 39.7 01/01/2021   MCV 88 01/01/2021   PLT 308 01/01/2021   Lab Results  Component Value Date   NA 138 01/01/2021   K 4.2 01/01/2021   CO2 25 01/01/2021   GLUCOSE 96 01/01/2021   BUN 9 01/01/2021   CREATININE 0.70 01/01/2021   BILITOT <0.2 01/01/2021   ALKPHOS 80 01/01/2021   AST 18 01/01/2021    ALT 19 01/01/2021   PROT 6.9 01/01/2021   ALBUMIN 4.1 01/01/2021   CALCIUM 9.3 01/01/2021   EGFR 109 01/01/2021   Lab Results  Component Value Date   CHOL 183 01/01/2021   Lab Results  Component Value Date   HDL 50 01/01/2021   Lab Results  Component Value Date   LDLCALC 114 (H) 01/01/2021   Lab Results  Component Value Date   TRIG 107 01/01/2021   Lab Results  Component Value Date   CHOLHDL 3.7 01/01/2021   Lab Results  Component Value Date   HGBA1C 5.0 06/24/2016       Assessment & Plan:   Problem List Items Addressed This Visit   None Visit Diagnoses     Acute non-recurrent frontal sinusitis    -  Primary   Continue doxycycline and OTC meds. Will add prednisone taper to help with eustachian tube. Check flu per pt request. F/U if not improving   Relevant Medications   predniSONE (DELTASONE) 10 MG tablet  Other Relevant Orders   POCT Influenza A/B (Completed)        Meds ordered this encounter  Medications   predniSONE (DELTASONE) 10 MG tablet    Sig: Take 6 tablets today, then 5 tablets tomorrow, then decrease by 1 tablet every day until gone    Dispense:  21 tablet    Refill:  0     Charyl Dancer, NP

## 2021-05-17 NOTE — Patient Instructions (Signed)
It was great to see you!  Start prednisone taper in the morning with food.   Continue antibiotic [place] prescribed and over the counter medications you are currently taking.   Drink plenty of fluids and get rest.   Follow-up if your symptoms are not improving or with any concerns.   Take care,  Rodman Pickle, NP

## 2021-05-25 ENCOUNTER — Ambulatory Visit: Payer: Managed Care, Other (non HMO) | Admitting: Nurse Practitioner

## 2021-06-29 ENCOUNTER — Other Ambulatory Visit: Payer: Self-pay | Admitting: Nurse Practitioner

## 2021-06-29 DIAGNOSIS — F325 Major depressive disorder, single episode, in full remission: Secondary | ICD-10-CM

## 2021-07-12 ENCOUNTER — Encounter: Payer: Self-pay | Admitting: Nurse Practitioner

## 2021-07-16 ENCOUNTER — Ambulatory Visit (INDEPENDENT_AMBULATORY_CARE_PROVIDER_SITE_OTHER): Payer: Managed Care, Other (non HMO) | Admitting: Nurse Practitioner

## 2021-07-16 ENCOUNTER — Encounter: Payer: Self-pay | Admitting: Nurse Practitioner

## 2021-07-16 DIAGNOSIS — E559 Vitamin D deficiency, unspecified: Secondary | ICD-10-CM | POA: Diagnosis not present

## 2021-07-16 DIAGNOSIS — E538 Deficiency of other specified B group vitamins: Secondary | ICD-10-CM

## 2021-07-16 DIAGNOSIS — J40 Bronchitis, not specified as acute or chronic: Secondary | ICD-10-CM

## 2021-07-16 DIAGNOSIS — R5383 Other fatigue: Secondary | ICD-10-CM | POA: Diagnosis not present

## 2021-07-16 LAB — POCT INFLUENZA A/B
Influenza A, POC: NEGATIVE
Influenza B, POC: NEGATIVE

## 2021-07-16 MED ORDER — HYDROCOD POLI-CHLORPHE POLI ER 10-8 MG/5ML PO SUER: 5.0000 mL | Freq: Two times a day (BID) | ORAL | 0 refills | Status: DC | PRN

## 2021-07-16 MED ORDER — PREDNISONE 10 MG PO TABS: ORAL_TABLET | ORAL | 0 refills | Status: DC

## 2021-07-16 NOTE — Assessment & Plan Note (Signed)
History of vitamin B12 deficiency, she is taking a supplement daily.  We will check vitamin B12 levels today with ongoing fatigue. ?

## 2021-07-16 NOTE — Assessment & Plan Note (Signed)
Chronic, ongoing.  We will check vitamin D levels today as she is having acute fatigue. ?

## 2021-07-16 NOTE — Patient Instructions (Signed)
It was great to see you! ? ?Start prednisone taper 6 tablets today, 5 tablets tomorrow then decrease by 1 every day until gone. Start tussionex as needed for cough. This can make you sleepy, do not drive. Drink plenty of fluids.  ? ?Let's follow-up if your symptoms don't improve or worsen.  ? ?Take care, ? ?Vance Peper, NP ? ?

## 2021-07-16 NOTE — Progress Notes (Signed)
? ?Acute Office Visit ? ?Subjective:  ? ? Patient ID: Shelby Rios, female    DOB: 1975-10-23, 46 y.o.   MRN: [id number] ? ?Chief Complaint  ?Patient presents with  ? URI  ?  Pt c/o productive cough, chest congestion, low-grade fever, head/body aches, and fatigue x2-3 days. Neg at-home Covid test 3/26  ? ? ?HPI ?Patient is in today for severe fatigue and tiredness for the last 3 weeks. She has been needing daily naps. She has not changed medications recently. She is taking a vitamin B12, vitamin D, and multivitamin daily. Then on Friday, she developed a cough and body aches. She denies depression, overwhelming sadness, and anxiety. Home covid-19 test negative.  ? ?UPPER RESPIRATORY TRACT INFECTION ? ?Fever:  subjective, chills ?Cough: yes - productive yellow ?Shortness of breath: yes ?Wheezing: no ?Chest pain: yes, with cough ?Chest tightness: yes ?Chest congestion: yes ?Nasal congestion: no ?Runny nose: no ?Post nasal drip: yes ?Sneezing: yes ?Sore throat: no ?Swollen glands: no ?Sinus pressure: no ?Headache: yes ?Face pain: no ?Toothache: no ?Ear pain: yes bilateral ?Ear pressure: no bilateral ?Eyes red/itching:no ?Eye drainage/crusting: no  ?Vomiting: no ?Rash: no ?Fatigue: yes ?Body Aches: Yes ?Joint aches: Yes ?Sick contacts: no ?Strep contacts: no  ?Context: fluctuating ?Recurrent sinusitis: no ?Relief with OTC cold/cough medications: no  ?Treatments attempted: increase fluids, tylenol cold and flu, daily allergy pill, hot tea ?  ?Past Medical History:  ?Diagnosis Date  ? Abnormal mammogram, unspecified   ? Abnormal weight gain   ? Allergy   ? Anemia   ? Anxiety   ? Back pain   ? Breast pain, right   ? Depression   ? Dyspepsia   ? Edema   ? Fatigue   ? GERD (gastroesophageal reflux disease)   ? H. pylori infection   ? Hypertension   ? only during pregnancy  ? Nonspecific abnormal results of other specified function study   ? Obesity   ? Palpitations   ? Routine general medical examination at a health care  facility   ? Seizures ([place])   ? couple seisure around age 22- tx with dilantin for a period then d/c  ? Sleep apnea   ? c-pap wears  ? ? ?Past Surgical History:  ?Procedure Laterality Date  ? adenoidectomy    ? BRCA test    ? neg  ? DILATION AND CURETTAGE OF UTERUS    ? PARTIAL HYSTERECTOMY  04/22/2006  ? endometriosis, done vaginally, she has her ovaries  ? TONSILLECTOMY    ? WISDOM TOOTH EXTRACTION    ? ? ?Family History  ?Problem Relation Age of Onset  ? Ovarian cancer Mother   ? Stroke Mother 18  ? Diabetes Mother   ? Seizures Mother   ? Depression Mother   ? Hypertension Mother   ? Obesity Mother   ? Colon polyps Mother   ? Irritable bowel syndrome Mother   ? Heart disease Father   ?     CABG age 61  ? Diabetes Father   ? Depression Father   ? Early death Father   ? Hypertension Father   ? Obesity Father   ? Hypokalemia Sister   ?     deceased at 33; was getting regular IV K infusions; long Hx of pseudotumor cerebri  ? Early death Sister   ? Breast cancer Maternal Grandmother   ? Heart attack Other   ?     GF - 35  ? Coronary artery disease  Other   ?     family hx  ? Hypertension Other   ?     family hx  ? Diabetes Other   ?     family hx  ? Colon cancer Neg Hx   ? Esophageal cancer Neg Hx   ? Pancreatic cancer Neg Hx   ? Rectal cancer Neg Hx   ? Stomach cancer Neg Hx   ? ? ?Social History  ? ?Socioeconomic History  ? Marital status: Married  ?  Spouse name: Maudie Mercury  ? Number of children: 2  ? Years of education: Not on file  ? Highest education level: Bachelor's degree (e.g., BA, AB, BS)  ?Occupational History  ? Occupation: Designer, jewellery  ?  Employer: [employer]  ?Tobacco Use  ? Smoking status: Never  ? Smokeless tobacco: Never  ? Tobacco comments:  ?  nonsmoker  ?Vaping Use  ? Vaping Use: Never used  ?Substance and Sexual Activity  ? Alcohol use: No  ? Drug use: No  ? Sexual activity: Yes  ?  Birth control/protection: Surgical  ?  Comment: hysterectomy  ?Other Topics Concern  ? Not on file  ?Social History  Narrative  ? Divorced since 9/08; 2 children  ? 3 coffees/day   ?   ? Patient is right-handed. She lives with her husband in a one level home. She drinks 4-6 cups of coffee a day, and 1-2 sodas a week. She participates in boot camp 3 x week and hot yoga 3 x a week, each for 1 hour.  ? ?Social Determinants of Health  ? ?Financial Resource Strain: Not on file  ?Food Insecurity: Not on file  ?Transportation Needs: Not on file  ?Physical Activity: Not on file  ?Stress: Not on file  ?Social Connections: Not on file  ?Intimate Partner Violence: Not on file  ? ? ?Outpatient Medications Prior to Visit  ?Medication Sig Dispense Refill  ? acyclovir (ZOVIRAX) 800 MG tablet Take 800 mg by mouth 3 (three) times daily.    ? ALPRAZolam (XANAX) 0.25 MG tablet Take 1 tablet (0.25 mg total) by mouth 2 (two) times daily as needed for anxiety or sleep. 60 tablet 0  ? Ascorbic Acid (VITAMIN C PO) Take by mouth.    ? pantoprazole (PROTONIX) 20 MG tablet TAKE 1 TABLET BY MOUTH  DAILY AS NEEDED 90 tablet 3  ? rizatriptan (MAXALT-MLT) 10 MG disintegrating tablet TAKE 1 TABLET BY MOUTH AS NEEDED FOR MIGRAINE. MAY REPEAT IN 2HRS IF NEEDED. MAX 2 TABS/24HRS 9 tablet 3  ? sertraline (ZOLOFT) 50 MG tablet Take 1 tablet (50 mg total) by mouth daily. 90 tablet 3  ? vitamin B-12 (CYANOCOBALAMIN) 100 MCG tablet Take 100 mcg by mouth daily.    ? Vitamin D, Ergocalciferol, (DRISDOL) 1.25 MG (50000 UNIT) CAPS capsule Take 1 capsule (50,000 Units total) by mouth every 7 (seven) days. 12 capsule 0  ? ZINC OXIDE PO Take by mouth.    ? predniSONE (DELTASONE) 10 MG tablet Take 6 tablets today, then 5 tablets tomorrow, then decrease by 1 tablet every day until gone 21 tablet 0  ? ?No facility-administered medications prior to visit.  ? ? ?No Known Allergies ? ?Review of Systems ?See pertinent positives and negatives per HPI. ?   ?Objective:  ?  ?Physical Exam ?Vitals and nursing note reviewed.  ?Constitutional:   ?   General: She is not in acute distress. ?    Appearance: Normal appearance.  ?HENT:  ?  Head: Normocephalic.  ?   Right Ear: Tympanic membrane, ear canal and external ear normal.  ?   Left Ear: Tympanic membrane, ear canal and external ear normal.  ?Eyes:  ?   Conjunctiva/sclera: Conjunctivae normal.  ?Cardiovascular:  ?   Rate and Rhythm: Normal rate and regular rhythm.  ?   Pulses: Normal pulses.  ?   Heart sounds: Normal heart sounds.  ?Pulmonary:  ?   Effort: Pulmonary effort is normal.  ?   Breath sounds: Normal breath sounds.  ?Musculoskeletal:  ?   Cervical back: Normal range of motion. Tenderness (slight) present.  ?Lymphadenopathy:  ?   Cervical: No cervical adenopathy.  ?Skin: ?   General: Skin is warm.  ?Neurological:  ?   General: No focal deficit present.  ?   Mental Status: She is alert and oriented to person, place, and time.  ?Psychiatric:     ?   Mood and Affect: Mood normal.     ?   Behavior: Behavior normal.     ?   Thought Content: Thought content normal.     ?   Judgment: Judgment normal.  ? ? ?LMP 07/22/2006  ?Wt Readings from Last 3 Encounters:  ?05/17/21 213 lb (96.6 kg)  ?05/14/21 211 lb (95.7 kg)  ?03/09/21 218 lb (98.9 kg)  ? ? ?Health Maintenance Due  ?Topic Date Due  ? COVID-19 Vaccine (3 - Booster for Moderna series) 08/20/2019  ? ? ?There are no preventive care reminders to display for this patient. ? ? ?Lab Results  ?Component Value Date  ? TSH 1.020 01/01/2021  ? ?Lab Results  ?Component Value Date  ? WBC 7.6 01/01/2021  ? HGB 12.6 01/01/2021  ? HCT 39.7 01/01/2021  ? MCV 88 01/01/2021  ? PLT 308 01/01/2021  ? ?Lab Results  ?Component Value Date  ? NA 138 01/01/2021  ? K 4.2 01/01/2021  ? CO2 25 01/01/2021  ? GLUCOSE 96 01/01/2021  ? BUN 9 01/01/2021  ? CREATININE 0.70 01/01/2021  ? BILITOT <0.2 01/01/2021  ? ALKPHOS 80 01/01/2021  ? AST 18 01/01/2021  ? ALT 19 01/01/2021  ? PROT 6.9 01/01/2021  ? ALBUMIN 4.1 01/01/2021  ? CALCIUM 9.3 01/01/2021  ? EGFR 109 01/01/2021  ? ?Lab Results  ?Component Value Date  ? CHOL 183  01/01/2021  ? ?Lab Results  ?Component Value Date  ? HDL 50 01/01/2021  ? ?Lab Results  ?Component Value Date  ? LDLCALC 114 (H) 01/01/2021  ? ?Lab Results  ?Component Value Date  ? TRIG 107 01/01/2021  ? ?Lab Resu

## 2021-07-18 ENCOUNTER — Encounter: Payer: Self-pay | Admitting: Nurse Practitioner

## 2021-07-18 LAB — ROCKY MTN SPOTTED FVR ABS PNL(IGG+IGM)
RMSF IgG: NEGATIVE
RMSF IgM: 0.31 index (ref 0.00–0.89)

## 2021-07-18 LAB — TSH: TSH: 2.09 u[IU]/mL (ref 0.450–4.500)

## 2021-07-18 LAB — BASIC METABOLIC PANEL
BUN/Creatinine Ratio: 6 — ABNORMAL LOW (ref 9–23)
BUN: 5 mg/dL — ABNORMAL LOW (ref 6–24)
CO2: 24 mmol/L (ref 20–29)
Calcium: 9.1 mg/dL (ref 8.7–10.2)
Chloride: 98 mmol/L (ref 96–106)
Creatinine, Ser: 0.79 mg/dL (ref 0.57–1.00)
Glucose: 148 mg/dL — ABNORMAL HIGH (ref 70–99)
Potassium: 4.1 mmol/L (ref 3.5–5.2)
Sodium: 137 mmol/L (ref 134–144)
eGFR: 94 mL/min/{1.73_m2} (ref >59)

## 2021-07-18 LAB — VITAMIN D 25 HYDROXY (VIT D DEFICIENCY, FRACTURES): Vit D, 25-Hydroxy: 26.2 ng/mL — ABNORMAL LOW (ref 30.0–100.0)

## 2021-07-18 LAB — CBC WITH DIFFERENTIAL/PLATELET

## 2021-07-18 LAB — VITAMIN B12: Vitamin B-12: 773 pg/mL (ref 232–1245)

## 2021-07-18 LAB — LYME DISEASE SEROLOGY W/REFLEX: Lyme Total Antibody EIA: NEGATIVE

## 2021-07-20 ENCOUNTER — Other Ambulatory Visit (INDEPENDENT_AMBULATORY_CARE_PROVIDER_SITE_OTHER): Payer: Managed Care, Other (non HMO)

## 2021-07-20 DIAGNOSIS — R5383 Other fatigue: Secondary | ICD-10-CM

## 2021-07-20 NOTE — Telephone Encounter (Signed)
Pt will be by today to have CBC redrawn ?

## 2021-07-20 NOTE — Progress Notes (Signed)
Per the orders of  Vance Peper NP pt is here for labs-- Repeat CBC with differential/Platelets, collected, pt tolerated draw well. ? ?

## 2021-07-21 LAB — SPECIMEN STATUS

## 2021-07-25 LAB — CBC WITH DIFFERENTIAL/PLATELET
Basophils Absolute: 0 10*3/uL (ref 0.0–0.2)
Basos: 0 %
EOS (ABSOLUTE): 0.1 10*3/uL (ref 0.0–0.4)
Eos: 1 %
Hematocrit: 39.4 % (ref 34.0–46.6)
Hemoglobin: 12.8 g/dL (ref 11.1–15.9)
Immature Grans (Abs): 0.1 10*3/uL (ref 0.0–0.1)
Immature Granulocytes: 1 %
Lymphocytes Absolute: 3.3 10*3/uL — ABNORMAL HIGH (ref 0.7–3.1)
Lymphs: 34 %
MCH: 27.6 pg (ref 26.6–33.0)
MCHC: 32.5 g/dL (ref 31.5–35.7)
MCV: 85 fL (ref 79–97)
Monocytes Absolute: 0.9 10*3/uL (ref 0.1–0.9)
Monocytes: 10 %
Neutrophils Absolute: 5.3 10*3/uL (ref 1.4–7.0)
Neutrophils: 54 %
Platelets: 332 10*3/uL (ref 150–450)
RBC: 4.64 x10E6/uL (ref 3.77–5.28)
RDW: 13.1 % (ref 11.7–15.4)
WBC: 9.6 10*3/uL (ref 3.4–10.8)

## 2021-07-25 LAB — SPECIMEN STATUS REPORT

## 2021-08-02 ENCOUNTER — Other Ambulatory Visit (INDEPENDENT_AMBULATORY_CARE_PROVIDER_SITE_OTHER): Payer: Managed Care, Other (non HMO)

## 2021-08-02 ENCOUNTER — Encounter: Payer: Self-pay | Admitting: Nurse Practitioner

## 2021-08-02 ENCOUNTER — Telehealth (INDEPENDENT_AMBULATORY_CARE_PROVIDER_SITE_OTHER): Payer: Managed Care, Other (non HMO) | Admitting: Nurse Practitioner

## 2021-08-02 VITALS — Temp 97.6°F | Ht 67.0 in | Wt 216.0 lb

## 2021-08-02 DIAGNOSIS — R5383 Other fatigue: Secondary | ICD-10-CM

## 2021-08-02 DIAGNOSIS — F325 Major depressive disorder, single episode, in full remission: Secondary | ICD-10-CM | POA: Diagnosis not present

## 2021-08-02 DIAGNOSIS — E559 Vitamin D deficiency, unspecified: Secondary | ICD-10-CM | POA: Diagnosis not present

## 2021-08-02 MED ORDER — BUPROPION HCL ER (XL) 150 MG PO TB24: 150.0000 mg | ORAL_TABLET | Freq: Every day | ORAL | 5 refills | Status: DC

## 2021-08-02 MED ORDER — VITAMIN D (ERGOCALCIFEROL) 1.25 MG (50000 UNIT) PO CAPS: 50000.0000 [IU] | ORAL_CAPSULE | ORAL | 0 refills | Status: DC

## 2021-08-02 NOTE — Assessment & Plan Note (Signed)
Hx of depression: ?Stable mood, denies any abnormal stressors ?Use of zoloft 50mg  x 65yrs ?Use of paxil in past, d/c due to side effects. ? ?Switch zoloft to wellbutrin to see if fatigue improves ?F/up in 32month ?

## 2021-08-02 NOTE — Assessment & Plan Note (Signed)
Repeat vit. D °

## 2021-08-02 NOTE — Assessment & Plan Note (Addendum)
Ms. Mcdonald reports fatigue x 87months, worse in last 16month, no fever, no muscle weakness, no joint swelling or redness or stiffness, no swollen lymph nodes, no sleep disturbance (bedtime 9pm, wakes up 5:30am). Exercise: walking daily, 77mile each day. ?Took oral abx and 5days prednisone dose pack 2weeks ago due to acute bronchitis ?Fhx of fibromyalgia (sister, maternal aunts). No Fhx of autoimmune disorder. ? ?Labs completed during last appt with my colleague: lyme dx, RMSFD, cbc, TSH, CMP, and B12 (normal) ?Check CK, ANA, ESR. ?

## 2021-08-02 NOTE — Progress Notes (Signed)
Virtual Visit via Video Note ? ?I connected withNAME@ on 08/02/21 at  8:40 AM EDT by a video enabled telemedicine application and verified that I am speaking with the correct person using two identifiers. ? ?Location: ?Patient:Home ?Provider: Office ?Participants: patient and provider ? ?I discussed the limitations of evaluation and management by telemedicine and the availability of in person appointments. ?I also discussed with the patient that there may be a patient responsible charge related to this service. The patient expressed understanding and agreed to proceed. ? ?CC:persistent fatigue ? ?History of Present Illness: ?Depression, major, single episode, complete remission (HCC) ?Hx of depression: ?Stable mood, denies any abnormal stressors ?Use of zoloft 50mg x 10yrs ?Use of paxil in past, d/c due to side effects. ? ?Switch zoloft to wellbutrin to see if fatigue improves ?F/up in 1month ? ?Vitamin D deficiency ?Repeat vit D ? ?Other fatigue ?Shelby Rios reports fatigue x 3months, worse in last 1month, no fever, no muscle weakness, no joint swelling or redness or stiffness, no swollen lymph nodes, no sleep disturbance (bedtime 9pm, wakes up 5:30am). Exercise: walking daily, 1mile each day. ?Took oral abx and 5days prednisone dose pack 2weeks ago due to acute bronchitis ?Fhx of fibromyalgia (sister, maternal aunts). No Fhx of autoimmune disorder. ? ?Labs completed during last appt with my colleague: lyme dx, RMSFD, cbc, TSH, CMP, and B12 (normal) ?Check CK, ANA, ESR.  ?S/p hysterectomy. ? ?  08/02/2021  ?  8:16 AM 01/01/2021  ?  2:23 PM 06/22/2020  ?  9:37 AM  ?Depression screen PHQ 2/9  ?Decreased Interest 0 0 1  ?Down, Depressed, Hopeless 0 0 0  ?PHQ - 2 Score 0 0 1  ?Altered sleeping 1 1 0  ?Tired, decreased energy 3 1 0  ?Change in appetite 0 0 0  ?Feeling bad or failure about yourself  0 0 0  ?Trouble concentrating 1 1 1  ?Moving slowly or fidgety/restless 0 0 0  ?Suicidal thoughts 0 0 0  ?PHQ-9 Score 5 3 2   ?Difficult doing work/chores Not difficult at all Not difficult at all Not difficult at all  ?  ?Observations/Objective: ?Physical Exam ?Vitals reviewed.  ?Constitutional:   ?   General: She is not in acute distress. ?Neurological:  ?   Mental Status: She is alert and oriented to person, place, and time.  ?  ?Assessment and Plan: ?Shelby Rios was seen today for acute visit. ? ?Diagnoses and all orders for this visit: ? ?Other fatigue ?-     Sedimentation rate; Future ?-     ANA w/Reflex; Future ?-     C-reactive protein; Future ?-     CK; Future ?-     CK ?-     C-reactive protein ?-     ANA w/Reflex ?-     Sedimentation rate ? ?Vitamin D deficiency ?-     Vitamin D, Ergocalciferol, (DRISDOL) 1.25 MG (50000 UNIT) CAPS capsule; Take 1 capsule (50,000 Units total) by mouth every 7 (seven) days. ? ?Depression, major, single episode, complete remission (HCC) ?-     buPROPion (WELLBUTRIN XL) 150 MG 24 hr tablet; Take 1 tablet (150 mg total) by mouth daily. ? ? ?Follow Up Instructions: ?See instructions above ?  ?I discussed the assessment and treatment plan with the patient. The patient was provided an opportunity to ask questions and all were answered. The patient agreed with the plan and demonstrated an understanding of the instructions. ?  ?The patient was advised to call back or   seek an in-person evaluation if the symptoms worsen or if the condition fails to improve as anticipated. ? ?Shelby Nche, NP  ?

## 2021-08-03 ENCOUNTER — Encounter: Payer: Self-pay | Admitting: Nurse Practitioner

## 2021-08-03 LAB — SPECIMEN STATUS

## 2021-08-03 LAB — ANA W/REFLEX: Anti Nuclear Antibody (ANA): NEGATIVE

## 2021-08-03 LAB — SEDIMENTATION RATE: Sed Rate: 56 mm/hr — ABNORMAL HIGH (ref 0–32)

## 2021-08-03 LAB — C-REACTIVE PROTEIN: CRP: 1 mg/L (ref 0–10)

## 2021-08-03 LAB — CK: Total CK: 45 U/L (ref 32–182)

## 2021-08-24 ENCOUNTER — Other Ambulatory Visit: Payer: Self-pay | Admitting: Nurse Practitioner

## 2021-08-24 DIAGNOSIS — F325 Major depressive disorder, single episode, in full remission: Secondary | ICD-10-CM

## 2021-09-05 ENCOUNTER — Encounter: Payer: Self-pay | Admitting: Nurse Practitioner

## 2021-09-05 ENCOUNTER — Telehealth (INDEPENDENT_AMBULATORY_CARE_PROVIDER_SITE_OTHER): Payer: Managed Care, Other (non HMO) | Admitting: Nurse Practitioner

## 2021-09-05 DIAGNOSIS — F325 Major depressive disorder, single episode, in full remission: Secondary | ICD-10-CM | POA: Diagnosis not present

## 2021-09-05 MED ORDER — BUPROPION HCL ER (XL) 150 MG PO TB24: 150.0000 mg | ORAL_TABLET | Freq: Every day | ORAL | 3 refills | Status: DC

## 2021-09-05 NOTE — Progress Notes (Signed)
Virtual Visit via Video Note ? ?I connected withNAME@ on 09/05/21 at  8:20 AM EDT by a video enabled telemedicine application and verified that I am speaking with the correct person using two identifiers. ? ?Location: ?Patient:Home ?Provider: Office ?Participants: patient and provider ? ?I discussed the limitations of evaluation and management by telemedicine and the availability of in person appointments. ?I also discussed with the patient that there may be a patient responsible charge related to this service. The patient expressed understanding and agreed to proceed. ? ?CC: anxiety and depression f/up ? ?History of Present Illness: ? Depression, major, single episode, complete remission (Shelby Rios) ?Improved and stable mood with wellbutrin ?Resolved fatigue ? ?Maintain med dose ?F/up in 46months  ? ?  09/05/2021  ?  8:19 AM 08/02/2021  ?  8:16 AM 01/01/2021  ?  2:23 PM  ?Depression screen PHQ 2/9  ?Decreased Interest 0 0 0  ?Down, Depressed, Hopeless 0 0 0  ?PHQ - 2 Score 0 0 0  ?Altered sleeping 0 1 1  ?Tired, decreased energy 0 3 1  ?Change in appetite 0 0 0  ?Feeling bad or failure about yourself  0 0 0  ?Trouble concentrating 0 1 1  ?Moving slowly or fidgety/restless 0 0 0  ?Suicidal thoughts 0 0 0  ?PHQ-9 Score 0 5 3  ?Difficult doing work/chores Not difficult at all Not difficult at all Not difficult at all  ?   ?Observations/Objective: ?Physical Exam ?Neurological:  ?   Mental Status: She is alert and oriented to person, place, and time.  ?Psychiatric:     ?   Mood and Affect: Mood normal.     ?   Behavior: Behavior normal.     ?   Thought Content: Thought content normal.  ?  ?Assessment and Plan: ?Shelby Rios was seen today for office visit. ? ?Diagnoses and all orders for this visit: ? ?Depression, major, single episode, complete remission (HCC) ?-     buPROPion (WELLBUTRIN XL) 150 MG 24 hr tablet; Take 1 tablet (150 mg total) by mouth daily. ? ? ?Follow Up Instructions: ?See instructions above ?  ?I discussed the  assessment and treatment plan with the patient. The patient was provided an opportunity to ask questions and all were answered. The patient agreed with the plan and demonstrated an understanding of the instructions. ?  ?The patient was advised to call back or seek an in-person evaluation if the symptoms worsen or if the condition fails to improve as anticipated. ? ?Wilfred Lacy, NP  ?

## 2021-09-05 NOTE — Assessment & Plan Note (Signed)
Improved and stable mood with wellbutrin ?Resolved fatigue ? ?Maintain med dose ?F/up in 55months ?

## 2021-09-07 ENCOUNTER — Encounter: Payer: Self-pay | Admitting: Nurse Practitioner

## 2021-09-07 DIAGNOSIS — E669 Obesity, unspecified: Secondary | ICD-10-CM

## 2021-09-07 MED ORDER — WEGOVY 0.25 MG/0.5ML ~~LOC~~ SOAJ: 0.2500 mg | SUBCUTANEOUS | 0 refills | Status: DC

## 2021-09-24 ENCOUNTER — Other Ambulatory Visit: Payer: Self-pay | Admitting: Nurse Practitioner

## 2021-09-24 DIAGNOSIS — E559 Vitamin D deficiency, unspecified: Secondary | ICD-10-CM

## 2021-09-28 ENCOUNTER — Telehealth: Payer: Managed Care, Other (non HMO) | Admitting: Nurse Practitioner

## 2021-10-02 ENCOUNTER — Encounter: Payer: Self-pay | Admitting: Nurse Practitioner

## 2021-10-02 ENCOUNTER — Telehealth (INDEPENDENT_AMBULATORY_CARE_PROVIDER_SITE_OTHER): Payer: Managed Care, Other (non HMO) | Admitting: Nurse Practitioner

## 2021-10-02 VITALS — BP 115/72 | Wt 209.4 lb

## 2021-10-02 DIAGNOSIS — Z7689 Persons encountering health services in other specified circumstances: Secondary | ICD-10-CM | POA: Diagnosis not present

## 2021-10-02 DIAGNOSIS — Z6832 Body mass index (BMI) 32.0-32.9, adult: Secondary | ICD-10-CM | POA: Diagnosis not present

## 2021-10-02 DIAGNOSIS — E6609 Other obesity due to excess calories: Secondary | ICD-10-CM

## 2021-10-02 MED ORDER — WEGOVY 0.5 MG/0.5ML ~~LOC~~ SOAJ: 0.5000 mg | SUBCUTANEOUS | 0 refills | Status: DC

## 2021-10-02 MED ORDER — WEGOVY 1 MG/0.5ML ~~LOC~~ SOAJ: 1.0000 mg | SUBCUTANEOUS | 0 refills | Status: DC

## 2021-10-02 NOTE — Assessment & Plan Note (Signed)
Lost 8lbs in last 84month with wegovy 0.25mg  Denies any adverse side effects Exercise: 3-4x/week crossfit Diet: low carb, low fat, avoids sugary snacks Wt Readings from Last 3 Encounters:  10/02/21 209 lb 6.4 oz (95 kg)  08/02/21 216 lb (98 kg)  05/17/21 213 lb (96.6 kg)   Increase wegovy dose to 0.5mg  weekly x4, then 1mg  weekly x4 F/up in 43months or sooner if needed

## 2021-10-02 NOTE — Progress Notes (Signed)
Virtual Visit via Video Note  I connected withNAME@ on 10/02/21 at  8:00 AM EDT by a video enabled telemedicine application and verified that I am speaking with the correct person using two identifiers.  Location: Patient:Home Provider: Office Participants: patient and provider  I discussed the limitations of evaluation and management by telemedicine and the availability of in person appointments. I also discussed with the patient that there may be a patient responsible charge related to this service. The patient expressed understanding and agreed to proceed.  TM:LYYTKP management  History of Present Illness:  Obesity Lost 8lbs in last 27month with wegovy 0.25mg  Denies any adverse side effects Exercise: 3-4x/week crossfit Diet: low carb, low fat, avoids sugary snacks Wt Readings from Last 3 Encounters:  10/02/21 209 lb 6.4 oz (95 kg)  08/02/21 216 lb (98 kg)  05/17/21 213 lb (96.6 kg)   Increase wegovy dose to 0.5mg  weekly x4, then 1mg  weekly x4 F/up in 57months or sooner if needed    Observations/Objective: Alert and oriented x4  Assessment and Plan: Kamilla was seen today for follow-up.  Diagnoses and all orders for this visit:  Class 1 obesity due to excess calories with serious comorbidity and body mass index (BMI) of 32.0 to 32.9 in adult -     Semaglutide-Weight Management (WEGOVY) 0.5 MG/0.5ML SOAJ; Inject 0.5 mg into the skin once a week. -     Semaglutide-Weight Management (WEGOVY) 1 MG/0.5ML SOAJ; Inject 1 mg into the skin once a week.  Encounter for weight management -     Semaglutide-Weight Management (WEGOVY) 0.5 MG/0.5ML SOAJ; Inject 0.5 mg into the skin once a week. -     Semaglutide-Weight Management (WEGOVY) 1 MG/0.5ML SOAJ; Inject 1 mg into the skin once a week.   Follow Up Instructions: See instructions above   I discussed the assessment and treatment plan with the patient. The patient was provided an opportunity to ask questions and all were answered.  The patient agreed with the plan and demonstrated an understanding of the instructions.   The patient was advised to call back or seek an in-person evaluation if the symptoms worsen or if the condition fails to improve as anticipated.  Vernona Rieger, NP

## 2021-10-10 NOTE — Telephone Encounter (Signed)
Called & spoke w/ pt. Adv that she can call around to a few pharmacies to see which one has them in stock & we can send that Rx to the one she finds.

## 2021-10-19 ENCOUNTER — Encounter: Payer: Self-pay | Admitting: Nurse Practitioner

## 2021-10-19 DIAGNOSIS — F325 Major depressive disorder, single episode, in full remission: Secondary | ICD-10-CM

## 2021-10-19 MED ORDER — BUPROPION HCL ER (XL) 300 MG PO TB24: 300.0000 mg | ORAL_TABLET | Freq: Every day | ORAL | 5 refills | Status: DC

## 2021-11-08 ENCOUNTER — Other Ambulatory Visit: Payer: Self-pay | Admitting: Nurse Practitioner

## 2021-11-08 DIAGNOSIS — E559 Vitamin D deficiency, unspecified: Secondary | ICD-10-CM

## 2021-11-08 NOTE — Telephone Encounter (Signed)
Chart supports Rx Last OV: 06/2021 Next OV: 11/2021  

## 2021-11-10 ENCOUNTER — Other Ambulatory Visit: Payer: Self-pay | Admitting: Nurse Practitioner

## 2021-11-10 DIAGNOSIS — F325 Major depressive disorder, single episode, in full remission: Secondary | ICD-10-CM

## 2021-11-12 NOTE — Telephone Encounter (Signed)
Chart supports Rx Last OV: 09/2021 Next OV: 11/2021  

## 2021-12-03 ENCOUNTER — Ambulatory Visit: Payer: Managed Care, Other (non HMO) | Admitting: Nurse Practitioner

## 2021-12-11 ENCOUNTER — Other Ambulatory Visit: Payer: Self-pay | Admitting: Nurse Practitioner

## 2021-12-11 DIAGNOSIS — F325 Major depressive disorder, single episode, in full remission: Secondary | ICD-10-CM

## 2021-12-31 ENCOUNTER — Other Ambulatory Visit: Payer: Self-pay | Admitting: Nurse Practitioner

## 2021-12-31 DIAGNOSIS — Z1231 Encounter for screening mammogram for malignant neoplasm of breast: Secondary | ICD-10-CM

## 2022-01-03 ENCOUNTER — Ambulatory Visit (INDEPENDENT_AMBULATORY_CARE_PROVIDER_SITE_OTHER): Payer: Managed Care, Other (non HMO) | Admitting: Nurse Practitioner

## 2022-01-03 ENCOUNTER — Encounter: Payer: Self-pay | Admitting: Nurse Practitioner

## 2022-01-03 VITALS — BP 134/82 | HR 72 | Temp 97.7°F | Ht 67.0 in | Wt 215.6 lb

## 2022-01-03 DIAGNOSIS — E559 Vitamin D deficiency, unspecified: Secondary | ICD-10-CM

## 2022-01-03 DIAGNOSIS — I10 Essential (primary) hypertension: Secondary | ICD-10-CM | POA: Insufficient documentation

## 2022-01-03 DIAGNOSIS — R030 Elevated blood-pressure reading, without diagnosis of hypertension: Secondary | ICD-10-CM

## 2022-01-03 DIAGNOSIS — F325 Major depressive disorder, single episode, in full remission: Secondary | ICD-10-CM

## 2022-01-03 DIAGNOSIS — Z0001 Encounter for general adult medical examination with abnormal findings: Secondary | ICD-10-CM

## 2022-01-03 DIAGNOSIS — E7800 Pure hypercholesterolemia, unspecified: Secondary | ICD-10-CM

## 2022-01-03 DIAGNOSIS — Z23 Encounter for immunization: Secondary | ICD-10-CM

## 2022-01-03 DIAGNOSIS — Z8249 Family history of ischemic heart disease and other diseases of the circulatory system: Secondary | ICD-10-CM

## 2022-01-03 DIAGNOSIS — R739 Hyperglycemia, unspecified: Secondary | ICD-10-CM | POA: Diagnosis not present

## 2022-01-03 NOTE — Assessment & Plan Note (Signed)
Reports elevated BP during biometric exam: 168/92. BP reading at home 130s/80s Reports she is compliant with CPAP machine Denies any headache or dizziness or Le edema.  Monitor BP at home every other day Send BP readings via mychart in 1week. Maintain DASH diet

## 2022-01-03 NOTE — Assessment & Plan Note (Signed)
Repeat lipid panel and lipo A Agreed to cardiology referral.

## 2022-01-03 NOTE — Assessment & Plan Note (Signed)
Reports she is compliant with CPAP use

## 2022-01-03 NOTE — Assessment & Plan Note (Signed)
Stable with wellbutrin Refill sent

## 2022-01-03 NOTE — Progress Notes (Signed)
Complete physical exam  Patient: Shelby Rios   DOB: [birth year]   46 y.o. Female  MRN: [id number] Visit Date: 01/03/2022  Subjective:    Chief Complaint  Patient presents with   Annual Exam    CPE Pt fasting  BP concerns  Flu shot given today    PAYSEN GOZA is a 46 y.o. female who presents today for a complete physical exam. She reports consuming a general diet.  No exercise regimen  She generally feels well. She reports sleeping fairly well. She does have additional problems to discuss today.  Vision:Yes Dental:Yes STD Screen:No  Most recent fall risk assessment:    01/03/2022    8:49 AM  Fall Risk   Falls in the past year? 0  Number falls in past yr: 0  Injury with Fall? 0   Most recent depression screenings:    01/03/2022    9:36 AM 09/05/2021    8:19 AM  PHQ 2/9 Scores  PHQ - 2 Score 2 0  PHQ- 9 Score 3 0   HPI  Depression, major, single episode, complete remission (HCC) Stable with wellbutrin Refill sent  Vitamin D deficiency Repeat vit. D  Family history of premature CAD Repeat lipid panel and lipo A Agreed to cardiology referral.  OSA (obstructive sleep apnea) Reports she is compliant with CPAP use  Elevated BP without diagnosis of hypertension Reports elevated BP during biometric exam: 168/92. BP reading at home 130s/80s Reports she is compliant with CPAP machine Denies any headache or dizziness or Le edema.  Monitor BP at home every other day Send BP readings via mychart in 1week. Maintain DASH diet   Past Medical History:  Diagnosis Date   Abnormal mammogram, unspecified    Abnormal weight gain    Allergy    Anemia    Anxiety    Back pain    Breast pain, right    Depression    Dyspepsia    Edema    Fatigue    GERD (gastroesophageal reflux disease)    H. pylori infection    Hypertension    only during pregnancy   Nonspecific abnormal results of other specified function study    Obesity    Palpitations    Routine general  medical examination at a health care facility    Seizures [facility])    couple seisure around age 36- tx with dilantin for a period then d/c   Sleep apnea    c-pap wears   Vitamin B12 deficiency 11/02/2014   Past Surgical History:  Procedure Laterality Date   adenoidectomy     BRCA test     neg   DILATION AND CURETTAGE OF UTERUS     PARTIAL HYSTERECTOMY  04/22/2006   endometriosis, done vaginally, she has her ovaries   TONSILLECTOMY     WISDOM TOOTH EXTRACTION     Social History   Socioeconomic History   Marital status: Married    Spouse name: Maudie Mercury   Number of children: 2   Years of education: Not on file   Highest education level: Bachelor's degree (e.g., BA, AB, BS)  Occupational History   Occupation: Primary school teacher: [employer]  Tobacco Use   Smoking status: Never   Smokeless tobacco: Never   Tobacco comments:    nonsmoker  Vaping Use   Vaping Use: Never used  Substance and Sexual Activity   Alcohol use: No   Drug use: No   Sexual activity: Yes  Birth control/protection: Surgical    Comment: hysterectomy  Other Topics Concern   Not on file  Social History Narrative   Divorced since 9/08; 2 children   3 coffees/day       Patient is right-handed. She lives with her husband in a one level home. She drinks 4-6 cups of coffee a day, and 1-2 sodas a week. She participates in boot camp 3 x week and hot yoga 3 x a week, each for 1 hour.   Social Determinants of Health   Financial Resource Strain: Not on file  Food Insecurity: Not on file  Transportation Needs: Not on file  Physical Activity: Not on file  Stress: Not on file  Social Connections: Not on file  Intimate Partner Violence: Not on file   Family Status  Relation Name Status   Mother Angelena Form Alive   Father Charlyne Petrin Deceased       CABG at 38    Sister Roselind Messier Deceased at age 31       complications from hypokalemia- was getting regular IV K infusions   MGM  (Not Specified)    Other  (Not Specified)   Neg Hx  (Not Specified)   Family History  Problem Relation Age of Onset   Ovarian cancer Mother    Stroke Mother 32   Diabetes Mother    Seizures Mother    Depression Mother    Hypertension Mother    Obesity Mother    Colon polyps Mother    Irritable bowel syndrome Mother    Heart disease Father        CABG age 90   Diabetes Father    Depression Father    Early death Father    Hypertension Father    Obesity Father    Hypokalemia Sister        deceased at 12; was getting regular IV K infusions; long Hx of pseudotumor cerebri   Early death Sister    Breast cancer Maternal Grandmother    Heart attack Other        GF - 35   Coronary artery disease Other        family hx   Hypertension Other        family hx   Diabetes Other        family hx   Colon cancer Neg Hx    Esophageal cancer Neg Hx    Pancreatic cancer Neg Hx    Rectal cancer Neg Hx    Stomach cancer Neg Hx    No Known Allergies  Patient Care Team: Mayleen Borrero, Charlene Brooke, NP as PCP - General (Internal Medicine) Pieter Partridge, DO as Consulting Physician (Neurology)   Medications: Outpatient Medications Prior to Visit  Medication Sig   acyclovir (ZOVIRAX) 800 MG tablet Take 800 mg by mouth 3 (three) times daily.   buPROPion (WELLBUTRIN XL) 300 MG 24 hr tablet TAKE 1 TABLET BY MOUTH EVERY DAY   multivitamin-iron-minerals-folic acid (CENTRUM) chewable tablet Chew 1 tablet by mouth daily.   neomycin-polymyxin b-dexamethasone (MAXITROL) 3.5-10000-0.1 SUSP 1 drop 4 (four) times daily.   pantoprazole (PROTONIX) 20 MG tablet TAKE 1 TABLET BY MOUTH  DAILY AS NEEDED   vitamin B-12 (CYANOCOBALAMIN) 100 MCG tablet Take 100 mcg by mouth daily.   Vitamin D, Ergocalciferol, (DRISDOL) 1.25 MG (50000 UNIT) CAPS capsule TAKE 1 CAPSULE (50,000 UNITS TOTAL) BY MOUTH EVERY 7 (SEVEN) DAYS   ZINC OXIDE PO Take by mouth.   [DISCONTINUED] ALPRAZolam Duanne Moron)  0.25 MG tablet Take 1 tablet (0.25 mg total) by mouth 2  (two) times daily as needed for anxiety or sleep. (Patient not taking: Reported on 01/03/2022)   [DISCONTINUED] Ascorbic Acid (VITAMIN C PO) Take by mouth. (Patient not taking: Reported on 01/03/2022)   [DISCONTINUED] rizatriptan (MAXALT-MLT) 10 MG disintegrating tablet TAKE 1 TABLET BY MOUTH AS NEEDED FOR MIGRAINE. MAY REPEAT IN 2HRS IF NEEDED. MAX 2 TABS/24HRS (Patient not taking: Reported on 01/03/2022)   [DISCONTINUED] Semaglutide-Weight Management (WEGOVY) 0.5 MG/0.5ML SOAJ Inject 0.5 mg into the skin once a week. (Patient not taking: Reported on 01/03/2022)   [DISCONTINUED] Semaglutide-Weight Management (WEGOVY) 1 MG/0.5ML SOAJ Inject 1 mg into the skin once a week. (Patient not taking: Reported on 01/03/2022)   No facility-administered medications prior to visit.    Review of Systems  Constitutional:  Negative for fever.  HENT:  Negative for congestion and sore throat.   Eyes:        Negative for visual changes  Respiratory:  Negative for cough and shortness of breath.   Cardiovascular:  Negative for chest pain, palpitations and leg swelling.  Gastrointestinal:  Negative for blood in stool, constipation and diarrhea.  Genitourinary:  Negative for dysuria, frequency and urgency.  Musculoskeletal:  Negative for myalgias.  Skin:  Negative for rash.  Neurological:  Negative for dizziness and headaches.  Hematological:  Does not bruise/bleed easily.  Psychiatric/Behavioral:  Negative for suicidal ideas. The patient is not nervous/anxious.        Objective:  BP 134/82   Pulse 72   Temp 97.7 F (36.5 C) (Temporal)   Ht $R'5\' 7"'nl$  (1.702 m)   Wt 215 lb 9.6 oz (97.8 kg)   LMP 07/22/2006   SpO2 97%   BMI 33.77 kg/m     BP Readings from Last 3 Encounters:  01/03/22 134/82  10/02/21 115/72  05/17/21 130/90   Wt Readings from Last 3 Encounters:  01/03/22 215 lb 9.6 oz (97.8 kg)  10/02/21 209 lb 6.4 oz (95 kg)  08/02/21 216 lb (98 kg)   Physical Exam Vitals reviewed.   Constitutional:      General: She is not in acute distress.    Appearance: She is obese.  HENT:     Right Ear: Tympanic membrane, ear canal and external ear normal.     Left Ear: Tympanic membrane, ear canal and external ear normal.     Nose: Nose normal.  Eyes:     General: No scleral icterus.    Extraocular Movements: Extraocular movements intact.     Conjunctiva/sclera: Conjunctivae normal.  Cardiovascular:     Rate and Rhythm: Normal rate and regular rhythm.     Pulses: Normal pulses.     Heart sounds: Normal heart sounds.  Pulmonary:     Effort: Pulmonary effort is normal. No respiratory distress.     Breath sounds: Normal breath sounds.  Abdominal:     General: Bowel sounds are normal. There is no distension.     Palpations: Abdomen is soft.  Musculoskeletal:        General: Normal range of motion.     Cervical back: Normal range of motion and neck supple.  Lymphadenopathy:     Cervical: No cervical adenopathy.  Skin:    General: Skin is warm and dry.  Neurological:     Mental Status: She is alert and oriented to person, place, and time.  Psychiatric:        Mood and Affect: Mood normal.  Behavior: Behavior normal.        Thought Content: Thought content normal.     No results found for any visits on 01/03/22.    Assessment & Plan:    Routine Health Maintenance and Physical Exam  Immunization History  Administered Date(s) Administered   Hepatitis B, adult 09/17/2013, 10/15/2013, 03/14/2014   Influenza Whole 02/05/2010   Influenza,inj,Quad PF,6+ Mos 03/04/2013, 02/10/2014, 01/12/2015, 01/04/2016, 01/13/2018, 12/21/2018, 01/01/2021, 01/03/2022   Influenza-Unspecified 01/12/2020   Moderna Sars-Covid-2 Vaccination 05/28/2019, 06/25/2019   PPD Test 09/17/2013, 10/15/2013   Tdap 11/12/2017   Health Maintenance  Topic Date Due   COVID-19 Vaccine (3 - Moderna series) 01/19/2022 (Originally 08/20/2019)   Hepatitis C Screening  01/04/2023 (Originally  09/23/1993)   TETANUS/TDAP  11/13/2027   COLONOSCOPY (Pts 45-53yrs Insurance coverage will need to be confirmed)  03/10/2031   INFLUENZA VACCINE  Completed   HIV Screening  Completed   HPV VACCINES  Aged Out   Discussed health benefits of physical activity, and encouraged her to engage in regular exercise appropriate for her age and condition.  Problem List Items Addressed This Visit       Other   Depression, major, single episode, complete remission ([place])    Stable with wellbutrin Refill sent      Elevated BP without diagnosis of hypertension    Reports elevated BP during biometric exam: 168/92. BP reading at home 130s/80s Reports she is compliant with CPAP machine Denies any headache or dizziness or Le edema.  Monitor BP at home every other day Send BP readings via mychart in 1week. Maintain DASH diet      Family history of premature CAD    Repeat lipid panel and lipo A Agreed to cardiology referral.      Relevant Orders   Ambulatory referral to Cardiology   Hypercholesteremia   Relevant Orders   Lipid panel   Ambulatory referral to Cardiology   Lipoprotein A (LPA)   Vitamin D deficiency    Repeat vit. D      Relevant Orders   Vitamin D (25 hydroxy)   Other Visit Diagnoses     Encounter for preventative adult health care exam with abnormal findings    -  Primary   Relevant Orders   Comprehensive metabolic panel   Hyperglycemia       Relevant Orders   Hemoglobin A1c   Need for influenza vaccination       Relevant Orders   Flu Vaccine QUAD 6+ mos PF IM (Fluarix Quad PF) (Completed)      Return in about 1 year (around 01/04/2023) for CPE (fasting).     Wilfred Lacy, NP

## 2022-01-03 NOTE — Patient Instructions (Signed)
Go to lab Monitor BP at home every other day Send BP readings via mychart in 1week. Maintain DASH diet and daily exercise.  Preventive Care 109-46 Years Old, Female Preventive care refers to lifestyle choices and visits with your health care provider that can promote health and wellness. Preventive care visits are also called wellness exams. What can I expect for my preventive care visit? Counseling Your health care provider may ask you questions about your: Medical history, including: Past medical problems. Family medical history. Pregnancy history. Current health, including: Menstrual cycle. Method of birth control. Emotional well-being. Home life and relationship well-being. Sexual activity and sexual health. Lifestyle, including: Alcohol, nicotine or tobacco, and drug use. Access to firearms. Diet, exercise, and sleep habits. Work and work Statistician. Sunscreen use. Safety issues such as seatbelt and bike helmet use. Physical exam Your health care provider will check your: Height and weight. These may be used to calculate your BMI (body mass index). BMI is a measurement that tells if you are at a healthy weight. Waist circumference. This measures the distance around your waistline. This measurement also tells if you are at a healthy weight and may help predict your risk of certain diseases, such as type 2 diabetes and high blood pressure. Heart rate and blood pressure. Body temperature. Skin for abnormal spots. What immunizations do I need?  Vaccines are usually given at various ages, according to a schedule. Your health care provider will recommend vaccines for you based on your age, medical history, and lifestyle or other factors, such as travel or where you work. What tests do I need? Screening Your health care provider may recommend screening tests for certain conditions. This may include: Lipid and cholesterol levels. Diabetes screening. This is done by checking your  blood sugar (glucose) after you have not eaten for a while (fasting). Pelvic exam and Pap test. Hepatitis B test. Hepatitis C test. HIV (human immunodeficiency virus) test. STI (sexually transmitted infection) testing, if you are at risk. Lung cancer screening. Colorectal cancer screening. Mammogram. Talk with your health care provider about when you should start having regular mammograms. This may depend on whether you have a family history of breast cancer. BRCA-related cancer screening. This may be done if you have a family history of breast, ovarian, tubal, or peritoneal cancers. Bone density scan. This is done to screen for osteoporosis. Talk with your health care provider about your test results, treatment options, and if necessary, the need for more tests. Follow these instructions at home: Eating and drinking  Eat a diet that includes fresh fruits and vegetables, whole grains, lean protein, and low-fat dairy products. Take vitamin and mineral supplements as recommended by your health care provider. Do not drink alcohol if: Your health care provider tells you not to drink. You are pregnant, may be pregnant, or are planning to become pregnant. If you drink alcohol: Limit how much you have to 0-1 drink a day. Know how much alcohol is in your drink. In the [place] one drink equals one 12 oz bottle of beer (355 mL), one 5 oz glass of wine (148 mL), or one 1 oz glass of hard liquor (44 mL). Lifestyle Brush your teeth every morning and night with fluoride toothpaste. Floss one time each day. Exercise for at least 30 minutes 5 or more days each week. Do not use any products that contain nicotine or tobacco. These products include cigarettes, chewing tobacco, and vaping devices, such as e-cigarettes. If you need help quitting, ask your  health care provider. Do not use drugs. If you are sexually active, practice safe sex. Use a condom or other form of protection to prevent STIs. If you do  not wish to become pregnant, use a form of birth control. If you plan to become pregnant, see your health care provider for a prepregnancy visit. Take aspirin only as told by your health care provider. Make sure that you understand how much to take and what form to take. Work with your health care provider to find out whether it is safe and beneficial for you to take aspirin daily. Find healthy ways to manage stress, such as: Meditation, yoga, or listening to music. Journaling. Talking to a trusted person. Spending time with friends and family. Minimize exposure to UV radiation to reduce your risk of skin cancer. Safety Always wear your seat belt while driving or riding in a vehicle. Do not drive: If you have been drinking alcohol. Do not ride with someone who has been drinking. When you are tired or distracted. While texting. If you have been using any mind-altering substances or drugs. Wear a helmet and other protective equipment during sports activities. If you have firearms in your house, make sure you follow all gun safety procedures. Seek help if you have been physically or sexually abused. What's next? Visit your health care provider once a year for an annual wellness visit. Ask your health care provider how often you should have your eyes and teeth checked. Stay up to date on all vaccines. This information is not intended to replace advice given to you by your health care provider. Make sure you discuss any questions you have with your health care provider. Document Revised: 10/04/2020 Document Reviewed: 10/04/2020 Elsevier Patient Education  [employer].

## 2022-01-03 NOTE — Assessment & Plan Note (Signed)
Repeat vit. D °

## 2022-01-04 LAB — SPECIMEN STATUS

## 2022-01-05 LAB — COMPREHENSIVE METABOLIC PANEL
ALT: 26 IU/L (ref 0–32)
AST: 17 IU/L (ref 0–40)
Albumin/Globulin Ratio: 1.4 (ref 1.2–2.2)
Albumin: 4.1 g/dL (ref 3.9–4.9)
Alkaline Phosphatase: 91 IU/L (ref 44–121)
BUN/Creatinine Ratio: 13 (ref 9–23)
BUN: 10 mg/dL (ref 6–24)
Bilirubin Total: 0.3 mg/dL (ref 0.0–1.2)
CO2: 21 mmol/L (ref 20–29)
Calcium: 9 mg/dL (ref 8.7–10.2)
Chloride: 101 mmol/L (ref 96–106)
Creatinine, Ser: 0.79 mg/dL (ref 0.57–1.00)
Globulin, Total: 3 g/dL (ref 1.5–4.5)
Glucose: 100 mg/dL — ABNORMAL HIGH (ref 70–99)
Potassium: 4 mmol/L (ref 3.5–5.2)
Sodium: 138 mmol/L (ref 134–144)
Total Protein: 7.1 g/dL (ref 6.0–8.5)
eGFR: 93 mL/min/{1.73_m2} (ref >59)

## 2022-01-05 LAB — LIPID PANEL
Chol/HDL Ratio: 3.5 ratio (ref 0.0–4.4)
Cholesterol, Total: 180 mg/dL (ref 100–199)
HDL: 52 mg/dL (ref >39)
LDL Chol Calc (NIH): 119 mg/dL — ABNORMAL HIGH (ref 0–99)
Triglycerides: 46 mg/dL (ref 0–149)
VLDL Cholesterol Cal: 9 mg/dL (ref 5–40)

## 2022-01-05 LAB — HEMOGLOBIN A1C
Est. average glucose Bld gHb Est-mCnc: 100 mg/dL
Hgb A1c MFr Bld: 5.1 % (ref 4.8–5.6)

## 2022-01-05 LAB — LIPOPROTEIN A (LPA): Lipoprotein (a): 23 nmol/L (ref <75.0)

## 2022-01-05 LAB — VITAMIN D 25 HYDROXY (VIT D DEFICIENCY, FRACTURES): Vit D, 25-Hydroxy: 30.3 ng/mL (ref 30.0–100.0)

## 2022-01-16 ENCOUNTER — Other Ambulatory Visit: Payer: Self-pay | Admitting: Nurse Practitioner

## 2022-01-16 DIAGNOSIS — F325 Major depressive disorder, single episode, in full remission: Secondary | ICD-10-CM

## 2022-01-21 ENCOUNTER — Ambulatory Visit: Payer: Managed Care, Other (non HMO) | Admitting: Cardiology

## 2022-01-21 ENCOUNTER — Encounter: Payer: Self-pay | Admitting: Cardiology

## 2022-01-21 VITALS — BP 144/84 | HR 75 | Temp 98.1°F | Resp 16 | Ht 67.0 in | Wt 216.4 lb

## 2022-01-21 DIAGNOSIS — Z8249 Family history of ischemic heart disease and other diseases of the circulatory system: Secondary | ICD-10-CM

## 2022-01-21 DIAGNOSIS — G4733 Obstructive sleep apnea (adult) (pediatric): Secondary | ICD-10-CM

## 2022-01-21 DIAGNOSIS — I10 Essential (primary) hypertension: Secondary | ICD-10-CM

## 2022-01-21 DIAGNOSIS — R0609 Other forms of dyspnea: Secondary | ICD-10-CM

## 2022-01-21 NOTE — Progress Notes (Signed)
ID:  Shelby Rios, DOB [birth year], MRN [id number]  PCP:  Flossie Buffy, NP  Cardiologist:  Rex Kras, DO, [facility] (established care 01/21/2022) Former Cardiology Providers: Dr. Ida Rogue and   REASON FOR CONSULT: Evaluation for heart disease given the family history of premature CAD  REQUESTING PHYSICIAN:  Nche, Charlene Brooke, [address] [place],  [place] [address]  Chief Complaint  Patient presents with   Family history of premature CAD   New Patient (Initial Visit)    HPI  Shelby Rios is a 46 y.o. Caucasian  female who presents to the clinic for evaluation of CAD given family history of premature disease at the request of Nche, Charlene Brooke, NP. Her past medical history and cardiovascular risk factors include: Family history of premature CAD, hypertension, OSA, obesity.   Patient presents today for evaluation for heart disease given her family history of CAD.  She denies anginal discomfort or heart failure symptoms.  When she overexerts herself she does experience dyspnea on exertion but that is usually short-lived and self-limited.  Father had CABG at the age of 69 and other confounding factors included smoking, diabetes, PAD.  He died at the age of 63 due to complications of sepsis.  Mother had CVA around the age of 108 with no significant residual deficits according to the patient.  Sister died at the age of 75 likely secondary to potassium hemostasis as she would require frequent treatments for Chiari malformation.  And paternal side has significant amount of CAD at a young age (less than 98 years of age).  Patient does not exercise on a regular basis.  Recently had her annual well visit and September 2023 results reviewed and noted below for further reference.  Home blood pressures consistently around 138/92.  FUNCTIONAL STATUS: No structured exercise program or daily routine.   ALLERGIES: No Known Allergies  MEDICATION LIST PRIOR TO VISIT: Current  Meds  Medication Sig   acyclovir (ZOVIRAX) 800 MG tablet Take 800 mg by mouth as needed.   buPROPion (WELLBUTRIN XL) 300 MG 24 hr tablet TAKE 1 TABLET BY MOUTH EVERY DAY   ibuprofen (ADVIL) 200 MG tablet Take 200 mg by mouth as needed.   multivitamin-iron-minerals-folic acid (CENTRUM) chewable tablet Chew 1 tablet by mouth daily.   pantoprazole (PROTONIX) 20 MG tablet TAKE 1 TABLET BY MOUTH  DAILY AS NEEDED   vitamin B-12 (CYANOCOBALAMIN) 100 MCG tablet Take 100 mcg by mouth daily.   Vitamin D, Ergocalciferol, (DRISDOL) 1.25 MG (50000 UNIT) CAPS capsule TAKE 1 CAPSULE (50,000 UNITS TOTAL) BY MOUTH EVERY 7 (SEVEN) DAYS   ZINC OXIDE PO Take by mouth.     PAST MEDICAL HISTORY: Past Medical History:  Diagnosis Date   Abnormal mammogram, unspecified    Abnormal weight gain    Allergy    Anemia    Anxiety    Back pain    Breast pain, right    Depression    Dyspepsia    Edema    Fatigue    GERD (gastroesophageal reflux disease)    H. pylori infection    Hypertension    only during pregnancy   Nonspecific abnormal results of other specified function study    Obesity    Palpitations    Routine general medical examination at a health care facility    Seizures [facility])    couple seisure around age 43- tx with dilantin for a period then d/c   Sleep apnea    c-pap  wears   Vitamin B12 deficiency 11/02/2014    PAST SURGICAL HISTORY: Past Surgical History:  Procedure Laterality Date   adenoidectomy     BRCA test     neg   DILATION AND CURETTAGE OF UTERUS     PARTIAL HYSTERECTOMY  04/22/2006   endometriosis, done vaginally, she has her ovaries   TONSILLECTOMY     WISDOM TOOTH EXTRACTION      FAMILY HISTORY: The patient family history includes Breast cancer in her maternal grandmother; Colon polyps in her mother; Coronary artery disease in an other family member; Depression in her father and mother; Diabetes in her father, mother, and another family member; Early death in her father  and sister; Heart attack in an other family member; Heart disease in her father; Hypertension in her father, mother, and another family member; Hypokalemia in her sister; Irritable bowel syndrome in her mother; Obesity in her father and mother; Ovarian cancer in her mother; Seizures in her mother; Stroke (age of onset: 46) in her mother.  SOCIAL HISTORY:  The patient  reports that she has never smoked. She has never used smokeless tobacco. She reports that she does not drink alcohol and does not use drugs.  REVIEW OF SYSTEMS: Review of Systems  Cardiovascular:  Positive for dyspnea on exertion (with over exertion). Negative for chest pain, claudication, irregular heartbeat, leg swelling, near-syncope, orthopnea, palpitations, paroxysmal nocturnal dyspnea and syncope.  Respiratory:  Negative for shortness of breath.   Hematologic/Lymphatic: Negative for bleeding problem.  Musculoskeletal:  Negative for muscle cramps and myalgias.  Neurological:  Negative for dizziness and light-headedness.    PHYSICAL EXAM:    01/21/2022    1:00 PM 01/03/2022    9:12 AM 01/03/2022    8:49 AM  Vitals with BMI  Height _0   _1   Weight 216 lbs 6 oz  215 lbs 10 oz  BMI 30.94  07.68  Systolic 088 110 315  Diastolic 84 82 82  Pulse 75  72    Physical Exam  Constitutional: No distress.  Age appropriate, hemodynamically stable.   Neck: No JVD present.  Cardiovascular: Normal rate, regular rhythm, S1 normal, S2 normal, intact distal pulses and normal pulses. Exam reveals no gallop, no S3 and no S4.  No murmur heard. Pulses:      Dorsalis pedis pulses are 2+ on the right side and 2+ on the left side.       Posterior tibial pulses are 2+ on the right side and 2+ on the left side.  Pulmonary/Chest: Effort normal and breath sounds normal. No stridor. She has no wheezes. She has no rales.  Abdominal: Soft. Bowel sounds are normal. She exhibits no distension. There is no abdominal tenderness.   Musculoskeletal:        General: No edema.     Cervical back: Neck supple.  Neurological: She is alert and oriented to person, place, and time. She has intact cranial nerves (2-12).  Skin: Skin is warm and moist.   CARDIAC DATABASE: EKG: 01/21/2022: Sinus rhythm, 68 bpm, incomplete right bundle branch block, without underlying injury pattern.  Echocardiogram: No results found for this or any previous visit from the past 1095 days.    Stress Testing: No results found for this or any previous visit from the past 1095 days.   Heart Catheterization: None  LABORATORY DATA:    Latest Ref Rng & Units 07/20/2021    2:54 PM 07/16/2021    2:03 PM 01/01/2021  2:30 PM  CBC  WBC 3.4 - 10.8 x10E3/uL 9.6  CANCELED  7.6   Hemoglobin 11.1 - 15.9 g/dL 12.8   12.6   Hematocrit 34.0 - 46.6 % 39.4   39.7   Platelets 150 - 450 x10E3/uL 332   308        Latest Ref Rng & Units 01/03/2022    9:45 AM 07/16/2021    2:03 PM 01/01/2021    2:30 PM  CMP  Glucose 70 - 99 mg/dL 100  148  96   BUN 6 - 24 mg/dL _0 Creatinine 0.57 - 1.00 mg/dL 0.79  0.79  0.70   Sodium 134 - 144 mmol/L 138  137  138   Potassium 3.5 - 5.2 mmol/L 4.0  4.1  4.2   Chloride 96 - 106 mmol/L 101  98  101   CO2 20 - 29 mmol/L _1 Calcium 8.7 - 10.2 mg/dL 9.0  9.1  9.3   Total Protein 6.0 - 8.5 g/dL 7.1   6.9   Total Bilirubin 0.0 - 1.2 mg/dL 0.3   <0.2   Alkaline Phos 44 - 121 IU/L 91   80   AST 0 - 40 IU/L 17   18   ALT 0 - 32 IU/L 26   19     Lipid Panel     Component Value Date/Time   CHOL 180 01/03/2022 0945   TRIG 46 01/03/2022 0945   HDL 52 01/03/2022 0945   CHOLHDL 3.5 01/03/2022 0945   LDLCALC 119 (H) 01/03/2022 0945   LABVLDL 9 01/03/2022 0945    No components found for: "NTPROBNP" No results for input(s): "PROBNP" in the last 8760 hours. Recent Labs    07/16/21 1403  TSH 2.090    BMP Recent Labs    07/16/21 1403 01/03/22 0945  NA 137 138  K 4.1 4.0  CL 98 101  CO2 24 21   GLUCOSE 148* 100*  BUN 5* 10  CREATININE 0.79 0.79  CALCIUM 9.1 9.0    HEMOGLOBIN A1C Lab Results  Component Value Date   HGBA1C 5.1 01/03/2022    IMPRESSION:    ICD-10-CM   1. Dyspnea on exertion  R06.09 PCV ECHOCARDIOGRAM COMPLETE    CT CARDIAC SCORING ([facility] LOCATIONS ONLY)    PCV CARDIAC STRESS TEST    2. Family history of premature CAD  Z82.49 EKG 12-Lead    PCV ECHOCARDIOGRAM COMPLETE    CT CARDIAC SCORING ([facility] LOCATIONS ONLY)    PCV CARDIAC STRESS TEST    3. OSA on CPAP  G47.33     4. Benign hypertension  I10        RECOMMENDATIONS: ILANA PREZIOSO is a 46 y.o. Caucasian female whose past medical history and cardiac risk factors include: Family history of premature CAD, hypertension, OSA, obesity.   Dyspnea on exertion Likely secondary to uncontrolled hypertension as the symptoms are more pronounced with over exertional activities. Echo Shelby be ordered to evaluate for structural heart disease and left ventricular systolic function. Exercise treadmill stress test to evaluate for functional capacity, exercise-induced arrhythmia/ischemia. Coronary calcium score for further risk stratification. Patient's home blood pressures are consistently greater than 130 mmHg.   Recommended pharmacological therapy but patient appears to be apprehensive. We Shelby discuss it further at the next office visit and she may discuss with her PCP as well.  Family history of premature CAD Work-up as discussed above. We have  sent the importance of improving her modifiable cardiovascular risk factors including lipid management, blood pressure management, weight loss.  OSA on CPAP Reemphasized the importance of CPAP compliance.  Benign hypertension Per patient, home blood pressures are consistently around 138/92. Recommended pharmacological therapy but appears agitated. She Shelby focus on reducing salt on a regular basis and increasing physical activity and reevaluate 3 months.  Her LDL  levels are currently 119 mg/dL and estimated 10-year risk of ASCVD is 0.9%.  Tentative plan is is to implement lifestyle changes and improving her modifiable cardiovascular risk factors and to reevaluate in 3 months.  Recommendations may change depending on her coronary calcium score.  Further recommendations to follow.  Data Reviewed: I have independently reviewed external notes provided by the referring provider as part of this office visit.   I have independently reviewed EKG, labs as part of medical decision making. I have ordered the following tests:  Orders Placed This Encounter  Procedures   CT CARDIAC SCORING ([facility] LOCATIONS ONLY)    Standing Status:   Future    Standing Expiration Date:   01/22/2023    Order Specific Question:   Preferred imaging location?    Answer:   [facility]    Order Specific Question:   Release to patient    Answer:   Immediate    Order Specific Question:   Is patient pregnant?    Answer:   No   PCV CARDIAC STRESS TEST    Standing Status:   Future    Standing Expiration Date:   01/22/2023   EKG 12-Lead   PCV ECHOCARDIOGRAM COMPLETE    Standing Status:   Future    Standing Expiration Date:   01/22/2023  I have made no medications changes at today's encounter as noted above.  FINAL MEDICATION LIST END OF ENCOUNTER: No orders of the defined types were placed in this encounter.   Medications Discontinued During This Encounter  Medication Reason   neomycin-polymyxin b-dexamethasone (MAXITROL) 3.5-10000-0.1 SUSP      Current Outpatient Medications:    acyclovir (ZOVIRAX) 800 MG tablet, Take 800 mg by mouth as needed., Disp: , Rfl:    buPROPion (WELLBUTRIN XL) 300 MG 24 hr tablet, TAKE 1 TABLET BY MOUTH EVERY DAY, Disp: 90 tablet, Rfl: 3   ibuprofen (ADVIL) 200 MG tablet, Take 200 mg by mouth as needed., Disp: , Rfl:    multivitamin-iron-minerals-folic acid (CENTRUM) chewable tablet, Chew 1 tablet by mouth daily., Disp: , Rfl:    pantoprazole (PROTONIX) 20 MG  tablet, TAKE 1 TABLET BY MOUTH  DAILY AS NEEDED, Disp: 90 tablet, Rfl: 3   vitamin B-12 (CYANOCOBALAMIN) 100 MCG tablet, Take 100 mcg by mouth daily., Disp: , Rfl:    Vitamin D, Ergocalciferol, (DRISDOL) 1.25 MG (50000 UNIT) CAPS capsule, TAKE 1 CAPSULE (50,000 UNITS TOTAL) BY MOUTH EVERY 7 (SEVEN) DAYS, Disp: 4 capsule, Rfl: 2   ZINC OXIDE PO, Take by mouth., Disp: , Rfl:   Orders Placed This Encounter  Procedures   CT CARDIAC SCORING ([facility] LOCATIONS ONLY)   PCV CARDIAC STRESS TEST   EKG 12-Lead   PCV ECHOCARDIOGRAM COMPLETE    There are no Patient Instructions on file for this visit.   --Continue cardiac medications as reconciled in final medication list. --Return in about 6 weeks (around 03/04/2022) for Follow up, Dyspnea, Review test results. or sooner if needed. --Continue follow-up with your primary care physician regarding the management of your other chronic comorbid conditions.  Patient's questions and concerns were  addressed to her satisfaction. She voices understanding of the instructions provided during this encounter.   This note was created using a voice recognition software as a result there may be grammatical errors inadvertently enclosed that do not reflect the nature of this encounter. Every attempt is made to correct such errors.  Rex Kras, [place], [facility]  Pager: [phone] Office: [phone]

## 2022-01-23 ENCOUNTER — Ambulatory Visit: Payer: Managed Care, Other (non HMO)

## 2022-01-23 DIAGNOSIS — Z8249 Family history of ischemic heart disease and other diseases of the circulatory system: Secondary | ICD-10-CM

## 2022-01-23 DIAGNOSIS — R0609 Other forms of dyspnea: Secondary | ICD-10-CM

## 2022-01-24 ENCOUNTER — Other Ambulatory Visit: Payer: Self-pay | Admitting: Nurse Practitioner

## 2022-01-24 DIAGNOSIS — K219 Gastro-esophageal reflux disease without esophagitis: Secondary | ICD-10-CM

## 2022-01-25 NOTE — Telephone Encounter (Signed)
Chart supports Rx Last OV: 12/2021 Next OV: 12/2022

## 2022-01-30 ENCOUNTER — Ambulatory Visit: Payer: Managed Care, Other (non HMO)

## 2022-01-31 ENCOUNTER — Ambulatory Visit: Payer: Managed Care, Other (non HMO)

## 2022-01-31 DIAGNOSIS — Z8249 Family history of ischemic heart disease and other diseases of the circulatory system: Secondary | ICD-10-CM

## 2022-01-31 DIAGNOSIS — R0609 Other forms of dyspnea: Secondary | ICD-10-CM

## 2022-02-01 ENCOUNTER — Ambulatory Visit
Admission: RE | Admit: 2022-02-01 | Discharge: 2022-02-01 | Disposition: A | Discharge status: Home / Self Care | Payer: No Typology Code available for payment source | Source: Ambulatory Visit | Attending: Cardiology | Admitting: Cardiology

## 2022-02-01 DIAGNOSIS — R0609 Other forms of dyspnea: Secondary | ICD-10-CM

## 2022-02-01 DIAGNOSIS — Z8249 Family history of ischemic heart disease and other diseases of the circulatory system: Secondary | ICD-10-CM

## 2022-02-11 ENCOUNTER — Ambulatory Visit
Admission: RE | Admit: 2022-02-11 | Discharge: 2022-02-11 | Disposition: A | Discharge status: Home / Self Care | Payer: Managed Care, Other (non HMO) | Source: Ambulatory Visit | Attending: Nurse Practitioner | Admitting: Nurse Practitioner

## 2022-02-11 DIAGNOSIS — Z1231 Encounter for screening mammogram for malignant neoplasm of breast: Secondary | ICD-10-CM

## 2022-03-04 ENCOUNTER — Ambulatory Visit: Payer: Managed Care, Other (non HMO) | Admitting: Cardiology

## 2022-03-04 ENCOUNTER — Ambulatory Visit: Payer: Managed Care, Other (non HMO)

## 2022-03-04 VITALS — BP 135/81 | HR 68 | Resp 16 | Ht 67.0 in | Wt 216.0 lb

## 2022-03-04 DIAGNOSIS — R0609 Other forms of dyspnea: Secondary | ICD-10-CM

## 2022-03-04 DIAGNOSIS — E7800 Pure hypercholesterolemia, unspecified: Secondary | ICD-10-CM

## 2022-03-04 DIAGNOSIS — I10 Essential (primary) hypertension: Secondary | ICD-10-CM

## 2022-03-04 DIAGNOSIS — G4733 Obstructive sleep apnea (adult) (pediatric): Secondary | ICD-10-CM

## 2022-03-04 DIAGNOSIS — M79661 Pain in right lower leg: Secondary | ICD-10-CM

## 2022-03-04 MED ORDER — LOSARTAN POTASSIUM 25 MG PO TABS: 25.0000 mg | ORAL_TABLET | Freq: Every day | ORAL | 3 refills | Status: DC

## 2022-03-04 NOTE — Progress Notes (Signed)
ID:  Shelby Rios, DOB January 19, 1976, MRN [id number]  PCP:  Flossie Buffy, NP  Cardiologist: Rex Kras, DO, [facility] (established care 01/21/2022) Former Cardiology Providers: Dr. Ida Rogue and   REASON FOR CONSULT: Evaluation for heart disease given the family history of premature CAD  REQUESTING PHYSICIAN:  Nche, Charlene Brooke, NP [address] [place],  [place] [address]  Chief Complaint  Patient presents with   Shortness of Breath   Follow-up    HPI  Shelby Rios is a 46 y.o. Caucasian  female who presents to the clinic for evaluation of CAD given family history of premature disease at the request of Nche, Charlene Brooke, NP. Her past medical history and cardiovascular risk factors include: Family history of premature CAD, hypertension, OSA, obesity.   Patient presents today for follow-up after cardiac testing. She denies anginal discomfort or heart failure symptoms.  When she overexerts herself she does experience dyspnea on exertion but that is usually short-lived and self-limited.  Patient does not exercise on a regular basis but does plan to start exercising daily.  Home blood pressures consistently around 138/92.  She does endorse lower leg pain that has been ongoing for approximately 1 year.  She describes the pain as an ache mostly present at rest and at night.  She is not having pain with walking or exertion.  Denies numbness or tingling in lower extremities.  Father had CABG at the age of 46 and other confounding factors included smoking, diabetes, PAD.  He died at the age of 30 due to complications of sepsis.  Mother had CVA around the age of 31 with no significant residual deficits according to the patient.  Sister died at the age of 24 likely secondary to potassium hemostasis as she would require frequent treatments for Chiari malformation.  And paternal side has significant amount of CAD at a young age (less than 78 years of age).  FUNCTIONAL STATUS: No  structured exercise program or daily routine.   ALLERGIES: No Known Allergies  MEDICATION LIST PRIOR TO VISIT: Current Meds  Medication Sig   acyclovir (ZOVIRAX) 800 MG tablet Take 800 mg by mouth as needed.   buPROPion (WELLBUTRIN XL) 300 MG 24 hr tablet TAKE 1 TABLET BY MOUTH EVERY DAY   ibuprofen (ADVIL) 200 MG tablet Take 200 mg by mouth as needed.   loratadine (CLARITIN) 10 MG tablet Take 10 mg by mouth daily.   losartan (COZAAR) 25 MG tablet Take 1 tablet (25 mg total) by mouth daily.   multivitamin-iron-minerals-folic acid (CENTRUM) chewable tablet Chew 1 tablet by mouth daily.   pantoprazole (PROTONIX) 20 MG tablet TAKE 1 TABLET BY MOUTH DAILY AS  NEEDED   vitamin B-12 (CYANOCOBALAMIN) 100 MCG tablet Take 100 mcg by mouth daily.   Vitamin D, Ergocalciferol, (DRISDOL) 1.25 MG (50000 UNIT) CAPS capsule TAKE 1 CAPSULE (50,000 UNITS TOTAL) BY MOUTH EVERY 7 (SEVEN) DAYS   ZINC OXIDE PO Take by mouth.     PAST MEDICAL HISTORY: Past Medical History:  Diagnosis Date   Abnormal mammogram, unspecified    Abnormal weight gain    Allergy    Anemia    Anxiety    Back pain    Breast pain, right    Depression    Dyspepsia    Edema    Fatigue    GERD (gastroesophageal reflux disease)    H. pylori infection    Hypertension    only during pregnancy   Nonspecific abnormal results of  other specified function study    Obesity    Palpitations    Routine general medical examination at a health care facility    Seizures [facility])    couple seisure around age 57- tx with dilantin for a period then d/c   Sleep apnea    c-pap wears   Vitamin B12 deficiency 11/02/2014    PAST SURGICAL HISTORY: Past Surgical History:  Procedure Laterality Date   adenoidectomy     BRCA test     neg   DILATION AND CURETTAGE OF UTERUS     PARTIAL HYSTERECTOMY  04/22/2006   endometriosis, done vaginally, she has her ovaries   TONSILLECTOMY     WISDOM TOOTH EXTRACTION      FAMILY HISTORY: The patient  family history includes Breast cancer in her maternal grandmother; Colon polyps in her mother; Coronary artery disease in an other family member; Depression in her father and mother; Diabetes in her father, mother, and another family member; Early death in her father and sister; Heart attack in an other family member; Heart disease in her father; Hypertension in her father, mother, and another family member; Hypokalemia in her sister; Irritable bowel syndrome in her mother; Obesity in her father and mother; Ovarian cancer in her mother; Seizures in her mother; Stroke (age of onset: 5) in her mother.  SOCIAL HISTORY:  The patient  reports that she has never smoked. She has never used smokeless tobacco. She reports that she does not drink alcohol and does not use drugs.  REVIEW OF SYSTEMS: Review of Systems  Cardiovascular:  Positive for dyspnea on exertion (with over exertion). Negative for chest pain, irregular heartbeat, leg swelling, near-syncope, orthopnea, palpitations, paroxysmal nocturnal dyspnea and syncope.  Respiratory:  Negative for shortness of breath.   Hematologic/Lymphatic: Negative for bleeding problem.  Musculoskeletal:  Negative for muscle cramps and myalgias.       BLE pain, see HPI  Neurological:  Negative for dizziness and light-headedness.    PHYSICAL EXAM:    03/04/2022    9:00 AM 01/21/2022    1:00 PM 01/03/2022    9:12 AM  Vitals with BMI  Height _0  _1    Weight 216 lbs 216 lbs 6 oz   BMI 40.98 11.91   Systolic 478 295 621  Diastolic 81 84 82  Pulse 68 75     Physical Exam  Constitutional: No distress.  Age appropriate, hemodynamically stable.   Neck: No JVD present.  Cardiovascular: Normal rate, regular rhythm, S1 normal, S2 normal, intact distal pulses and normal pulses. Exam reveals no gallop, no S3 and no S4.  No murmur heard. Pulses:      Dorsalis pedis pulses are 2+ on the right side and 2+ on the left side.       Posterior tibial pulses are 2+  on the right side and 2+ on the left side.  Pulmonary/Chest: Effort normal and breath sounds normal. No stridor. She has no wheezes. She has no rales.  Abdominal: Soft. Bowel sounds are normal. She exhibits no distension. There is no abdominal tenderness.  Musculoskeletal:        General: No edema.     Cervical back: Neck supple.  Neurological: She is alert and oriented to person, place, and time. She has intact cranial nerves (2-12).  Skin: Skin is warm and moist.   CARDIAC DATABASE: EKG: 01/21/2022: Sinus rhythm, 68 bpm, incomplete right bundle branch block, without underlying injury pattern.  Echocardiogram 01/23/2022:  Normal LV systolic  function with visual EF 60-65%. Left ventricle cavity  is normal in size. Normal left ventricular wall thickness. Normal global  wall motion. Normal diastolic filling pattern, normal LAP.  Mild tricuspid regurgitation. No evidence of pulmonary hypertension.  No prior study for comparison.    Exercise treadmill stress test 01/31/2022: Exercise treadmill stress test performed using Bruce protocol.  Patient reached 8.3 METS, and 98% of age predicted maximum heart rate.  Exercise capacity was fair.  No chest pain reported.  Normal heart rate and hemodynamic response. Stress EKG revealed no ischemic changes. Low risk study.  Heart Catheterization: None  CORONARY CALCIUM SCORES 02/01/2022:   Left Main: 0 LAD: 0 LCx: 0 RCA: 0 Total Agatston Score: 0 MESA database percentile: 0  LABORATORY DATA:    Latest Ref Rng & Units 07/20/2021    2:54 PM 07/16/2021    2:03 PM 01/01/2021    2:30 PM  CBC  WBC 3.4 - 10.8 x10E3/uL 9.6  CANCELED  7.6   Hemoglobin 11.1 - 15.9 g/dL 12.8   12.6   Hematocrit 34.0 - 46.6 % 39.4   39.7   Platelets 150 - 450 x10E3/uL 332   308        Latest Ref Rng & Units 01/03/2022    9:45 AM 07/16/2021    2:03 PM 01/01/2021    2:30 PM  CMP  Glucose 70 - 99 mg/dL 100  148  96   BUN 6 - 24 mg/dL _0 Creatinine 0.57 -  1.00 mg/dL 0.79  0.79  0.70   Sodium 134 - 144 mmol/L 138  137  138   Potassium 3.5 - 5.2 mmol/L 4.0  4.1  4.2   Chloride 96 - 106 mmol/L 101  98  101   CO2 20 - 29 mmol/L _1 Calcium 8.7 - 10.2 mg/dL 9.0  9.1  9.3   Total Protein 6.0 - 8.5 g/dL 7.1   6.9   Total Bilirubin 0.0 - 1.2 mg/dL 0.3   <0.2   Alkaline Phos 44 - 121 IU/L 91   80   AST 0 - 40 IU/L 17   18   ALT 0 - 32 IU/L 26   19     Lipid Panel     Component Value Date/Time   CHOL 180 01/03/2022 0945   TRIG 46 01/03/2022 0945   HDL 52 01/03/2022 0945   CHOLHDL 3.5 01/03/2022 0945   LDLCALC 119 (H) 01/03/2022 0945   LABVLDL 9 01/03/2022 0945   Recent Labs    07/16/21 1403  TSH 2.090   HEMOGLOBIN A1C Lab Results  Component Value Date   HGBA1C 5.1 01/03/2022   Lipoprotein A 01/03/2022: 23.0  IMPRESSION:    ICD-10-CM   1. Benign hypertension  S85 Basic Metabolic Panel (BMET)    2. Dyspnea on exertion  R06.09     3. OSA on CPAP  G47.33     4. Hypercholesteremia  E78.00 Lipid Panel With LDL/HDL Ratio    5. Pain in both lower legs  M79.661    M79.662        RECOMMENDATIONS: Shelby Rios is a 46 y.o. Caucasian female whose past medical history and cardiac risk factors include: Family history of premature CAD, hypertension, OSA, obesity.   Dyspnea on exertion Likely secondary to uncontrolled hypertension as the symptoms are more pronounced with over exertional activities. Reviewed results of echocardiogram, nuclear exercise stress test, and coronary calcium  score.  Testing revealed that she is low risk for cardiac event. Patient's home blood pressures are consistently greater than 130 mmHg.    Family history of premature CAD Work-up as discussed above. We have sent the importance of improving her modifiable cardiovascular risk factors including lipid management, blood pressure management, weight loss.  OSA on CPAP Reemphasized the importance of CPAP compliance.  Benign hypertension Per  patient, home blood pressures are consistently around 138/92. We will start losartan 25 mg daily.  We will check BMP in 2 weeks. She will focus on reducing salt on a regular basis and increasing physical activity and reevaluate 3 months.  Lower leg pain Low suspicion for PAD given physical exam findings and reported symptoms. Consider pain due to weight and limited exercise. I have advised compression stockings while awake.  Her LDL levels are currently 119 mg/dL and estimated 10-year risk of ASCVD is 0.9%.  Tentative plan is is to implement lifestyle changes and improving her modifiable cardiovascular risk factors and recheck lipid panel prior to next visit.  Discussed adding fiber to diet and flaxseed flakes.  Data Reviewed: I have independently reviewed external notes provided by the referring provider as part of this office visit.   I have independently reviewed labs as part of medical decision making. I have ordered the following tests:  Orders Placed This Encounter  Procedures   Lipid Panel With LDL/HDL Ratio   Basic Metabolic Panel (BMET)   FINAL MEDICATION LIST END OF ENCOUNTER: Meds ordered this encounter  Medications   losartan (COZAAR) 25 MG tablet    Sig: Take 1 tablet (25 mg total) by mouth daily.    Dispense:  90 tablet    Refill:  3    Order Specific Question:   Supervising Provider    Answer:   Adrian Prows [[id number]]    There are no discontinued medications.    Current Outpatient Medications:    acyclovir (ZOVIRAX) 800 MG tablet, Take 800 mg by mouth as needed., Disp: , Rfl:    buPROPion (WELLBUTRIN XL) 300 MG 24 hr tablet, TAKE 1 TABLET BY MOUTH EVERY DAY, Disp: 90 tablet, Rfl: 3   ibuprofen (ADVIL) 200 MG tablet, Take 200 mg by mouth as needed., Disp: , Rfl:    loratadine (CLARITIN) 10 MG tablet, Take 10 mg by mouth daily., Disp: , Rfl:    losartan (COZAAR) 25 MG tablet, Take 1 tablet (25 mg total) by mouth daily., Disp: 90 tablet, Rfl: 3    multivitamin-iron-minerals-folic acid (CENTRUM) chewable tablet, Chew 1 tablet by mouth daily., Disp: , Rfl:    pantoprazole (PROTONIX) 20 MG tablet, TAKE 1 TABLET BY MOUTH DAILY AS  NEEDED, Disp: 90 tablet, Rfl: 3   vitamin B-12 (CYANOCOBALAMIN) 100 MCG tablet, Take 100 mcg by mouth daily., Disp: , Rfl:    Vitamin D, Ergocalciferol, (DRISDOL) 1.25 MG (50000 UNIT) CAPS capsule, TAKE 1 CAPSULE (50,000 UNITS TOTAL) BY MOUTH EVERY 7 (SEVEN) DAYS, Disp: 4 capsule, Rfl: 2   ZINC OXIDE PO, Take by mouth., Disp: , Rfl:   Orders Placed This Encounter  Procedures   Lipid Panel With LDL/HDL Ratio   Basic Metabolic Panel (BMET)    There are no Patient Instructions on file for this visit.   --Continue cardiac medications as reconciled in final medication list. --Return in about 3 months (around 06/04/2022) for HTN, HLD. or sooner if needed. --Continue follow-up with your primary care physician regarding the management of your other chronic comorbid conditions.  Patient's questions  and concerns were addressed to her satisfaction. She voices understanding of the instructions provided during this encounter.   This note was created using a voice recognition software as a result there may be grammatical errors inadvertently enclosed that do not reflect the nature of this encounter. Every attempt is made to correct such errors.    Ernst Spell, [place] Pager: [phone] Office: [phone]

## 2022-04-10 ENCOUNTER — Other Ambulatory Visit: Payer: Self-pay

## 2022-04-10 ENCOUNTER — Encounter: Payer: Self-pay | Admitting: Cardiology

## 2022-04-10 MED ORDER — LOSARTAN POTASSIUM 25 MG PO TABS: 25.0000 mg | ORAL_TABLET | Freq: Every day | ORAL | 3 refills | Status: DC

## 2022-05-30 ENCOUNTER — Other Ambulatory Visit: Payer: Self-pay | Admitting: Nurse Practitioner

## 2022-05-30 DIAGNOSIS — F325 Major depressive disorder, single episode, in full remission: Secondary | ICD-10-CM

## 2022-06-04 ENCOUNTER — Encounter: Payer: Self-pay | Admitting: Cardiology

## 2022-06-04 ENCOUNTER — Ambulatory Visit: Payer: Managed Care, Other (non HMO) | Admitting: Cardiology

## 2022-06-04 ENCOUNTER — Ambulatory Visit: Payer: Managed Care, Other (non HMO)

## 2022-06-04 VITALS — BP 140/86 | HR 65 | Ht 67.0 in | Wt 210.0 lb

## 2022-06-04 DIAGNOSIS — R0609 Other forms of dyspnea: Secondary | ICD-10-CM

## 2022-06-04 DIAGNOSIS — G4733 Obstructive sleep apnea (adult) (pediatric): Secondary | ICD-10-CM

## 2022-06-04 DIAGNOSIS — I10 Essential (primary) hypertension: Secondary | ICD-10-CM

## 2022-06-04 DIAGNOSIS — Z8249 Family history of ischemic heart disease and other diseases of the circulatory system: Secondary | ICD-10-CM

## 2022-06-04 MED ORDER — LOSARTAN POTASSIUM 50 MG PO TABS: 50.0000 mg | ORAL_TABLET | Freq: Every morning | ORAL | 1 refills | Status: DC

## 2022-06-04 NOTE — Progress Notes (Signed)
ID:  Shelby Rios, DOB 1976/01/30, MRN [id number]  PCP:  Flossie Buffy, NP  Cardiologist:  Rex Kras, DO, [facility] (established care 01/21/2022) Former Cardiology Providers: Dr. Ida Rogue and   Date: 06/04/22 Last Office Visit: 01/21/2022  Chief Complaint  Patient presents with   Benign hypertension   Dyspnea on exertion   Follow-up    HPI  Shelby Rios is a 47 y.o. Caucasian  female whose past medical history and cardiovascular risk factors include: Family history of premature CAD, hypertension, OSA, obesity.   Patient presents today for evaluation for heart disease given her family history of CAD.  The last office visit given her risk factors and dyspnea on exertion the shared decision was to proceed with echocardiogram, GXT, and coronary calcium score.  The results of these diagnostic tests were reviewed with her in detail today and noted below for further reference.  At last office visit she was apprehensive to be on antihypertensive medications as her blood pressures were not well-controlled.  Her home blood pressures are also greater than 130 mmHg.  She wanted to reevaluate and discuss with PCP as well.  Since last office visit, denies anginal discomfort or heart failure symptoms.  Was started on losartan at last office visit and has done well with the blood pressure still not at goal.  Her blood pressures are still elevated likely secondary to increased stress..  Father had CABG at the age of 41 and other confounding factors included smoking, diabetes, PAD.  He died at the age of 2 due to complications of sepsis.  Mother had CVA around the age of 57 with no significant residual deficits according to the patient.  Sister died at the age of 68 likely secondary to potassium hemostasis as she would require frequent treatments for Chiari malformation.  And paternal side has significant amount of CAD at a young age (less than 80 years of age).   FUNCTIONAL STATUS: No  structured exercise program or daily routine.   ALLERGIES: No Known Allergies  MEDICATION LIST PRIOR TO VISIT: Current Meds  Medication Sig   acyclovir (ZOVIRAX) 800 MG tablet Take 800 mg by mouth as needed.   buPROPion (WELLBUTRIN XL) 300 MG 24 hr tablet TAKE 1 TABLET BY MOUTH EVERY DAY   cholecalciferol (VITAMIN D3) 25 MCG (1000 UNIT) tablet Take 1,000 Units by mouth daily.   ibuprofen (ADVIL) 200 MG tablet Take 200 mg by mouth as needed.   loratadine (CLARITIN) 10 MG tablet Take 10 mg by mouth daily.   multivitamin-iron-minerals-folic acid (CENTRUM) chewable tablet Chew 1 tablet by mouth daily.   pantoprazole (PROTONIX) 20 MG tablet TAKE 1 TABLET BY MOUTH DAILY AS  NEEDED   vitamin B-12 (CYANOCOBALAMIN) 100 MCG tablet Take 100 mcg by mouth daily.   [DISCONTINUED] losartan (COZAAR) 25 MG tablet Take 1 tablet (25 mg total) by mouth daily.     PAST MEDICAL HISTORY: Past Medical History:  Diagnosis Date   Abnormal mammogram, unspecified    Abnormal weight gain    Allergy    Anemia    Anxiety    Back pain    Breast pain, right    Depression    Dyspepsia    Edema    Fatigue    GERD (gastroesophageal reflux disease)    H. pylori infection    Hypertension    only during pregnancy   Nonspecific abnormal results of other specified function study    Obesity    Palpitations  Routine general medical examination at a health care facility    Seizures [facility])    couple seisure around age 69- tx with dilantin for a period then d/c   Sleep apnea    c-pap wears   Vitamin B12 deficiency 11/02/2014    PAST SURGICAL HISTORY: Past Surgical History:  Procedure Laterality Date   adenoidectomy     BRCA test     neg   DILATION AND CURETTAGE OF UTERUS     PARTIAL HYSTERECTOMY  04/22/2006   endometriosis, done vaginally, she has her ovaries   TONSILLECTOMY     WISDOM TOOTH EXTRACTION      FAMILY HISTORY: The patient family history includes Breast cancer in her maternal grandmother;  Colon polyps in her mother; Coronary artery disease in an other family member; Depression in her father and mother; Diabetes in her father, mother, and another family member; Early death in her father and sister; Heart attack in an other family member; Heart disease in her father; Hypertension in her father, mother, and another family member; Hypokalemia in her sister; Irritable bowel syndrome in her mother; Obesity in her father and mother; Ovarian cancer in her mother; Seizures in her mother; Stroke (age of onset: 35) in her mother.  SOCIAL HISTORY:  The patient  reports that she has never smoked. She has never used smokeless tobacco. She reports that she does not drink alcohol and does not use drugs.  REVIEW OF SYSTEMS: Review of Systems  Cardiovascular:  Positive for dyspnea on exertion (with over exertion). Negative for chest pain, claudication, irregular heartbeat, leg swelling, near-syncope, orthopnea, palpitations, paroxysmal nocturnal dyspnea and syncope.  Respiratory:  Negative for shortness of breath.   Hematologic/Lymphatic: Negative for bleeding problem.  Musculoskeletal:  Negative for muscle cramps and myalgias.  Neurological:  Negative for dizziness and light-headedness.    PHYSICAL EXAM:    06/04/2022    9:43 AM 03/04/2022    9:00 AM 01/21/2022    1:00 PM  Vitals with BMI  Height 5' 7"$  5' 7"$  5' 7"$   Weight 210 lbs 216 lbs 216 lbs 6 oz  BMI 32.88 [id number] [id number]  Systolic [id number]  Diastolic 86 81 84  Pulse 65 68 75    Physical Exam  Constitutional: No distress.  Age appropriate, hemodynamically stable.   Neck: No JVD present.  Cardiovascular: Normal rate, regular rhythm, S1 normal, S2 normal, intact distal pulses and normal pulses. Exam reveals no gallop, no S3 and no S4.  No murmur heard. Pulses:      Dorsalis pedis pulses are 2+ on the right side and 2+ on the left side.       Posterior tibial pulses are 2+ on the right side and 2+ on the left side.   Pulmonary/Chest: Effort normal and breath sounds normal. No stridor. She has no wheezes. She has no rales.  Abdominal: Soft. Bowel sounds are normal. She exhibits no distension. There is no abdominal tenderness.  Musculoskeletal:        General: No edema.     Cervical back: Neck supple.  Neurological: She is alert and oriented to person, place, and time. She has intact cranial nerves (2-12).  Skin: Skin is warm and moist.   CARDIAC DATABASE: EKG: 01/21/2022: Sinus rhythm, 68 bpm, incomplete right bundle branch block, without underlying injury pattern.  Echocardiogram: 01/23/2022: Normal LV systolic function with visual EF 60-65%. Left ventricle cavity is normal in size. Normal left ventricular wall thickness. Normal global wall motion. Normal diastolic  filling pattern, normal LAP.  Mild tricuspid regurgitation. No evidence of pulmonary hypertension. No prior study for comparison.     Stress Testing: Exercise treadmill stress test 01/31/2022: Exercise treadmill stress test performed using Bruce protocol.  Patient reached 8.3 METS, and 98% of age predicted maximum heart rate.  Exercise capacity was fair.  No chest pain reported.  Normal heart rate and hemodynamic response. Stress EKG revealed no ischemic changes. Low risk study.  Heart Catheterization: None  Coronary calcium score 02/02/2022: No visible coronary artery calcifications. Total coronary calcium score of 0. No acute or significant extracardiac abnormality.   LABORATORY DATA:    Latest Ref Rng & Units 07/20/2021    2:54 PM 07/16/2021    2:03 PM 01/01/2021    2:30 PM  CBC  WBC 3.4 - 10.8 x10E3/uL 9.6  CANCELED  7.6   Hemoglobin 11.1 - 15.9 g/dL 12.8   12.6   Hematocrit 34.0 - 46.6 % 39.4   39.7   Platelets 150 - 450 x10E3/uL 332   308        Latest Ref Rng & Units 01/03/2022    9:45 AM 07/16/2021    2:03 PM 01/01/2021    2:30 PM  CMP  Glucose 70 - 99 mg/dL 100  148  96   BUN 6 - 24 mg/dL 10  5  9   $ Creatinine  0.57 - 1.00 mg/dL 0.79  0.79  0.70   Sodium 134 - 144 mmol/L 138  137  138   Potassium 3.5 - 5.2 mmol/L 4.0  4.1  4.2   Chloride 96 - 106 mmol/L 101  98  101   CO2 20 - 29 mmol/L 21  24  25   $ Calcium 8.7 - 10.2 mg/dL 9.0  9.1  9.3   Total Protein 6.0 - 8.5 g/dL 7.1   6.9   Total Bilirubin 0.0 - 1.2 mg/dL 0.3   <0.2   Alkaline Phos 44 - 121 IU/L 91   80   AST 0 - 40 IU/L 17   18   ALT 0 - 32 IU/L 26   19     Lipid Panel     Component Value Date/Time   CHOL 180 01/03/2022 0945   TRIG 46 01/03/2022 0945   HDL 52 01/03/2022 0945   CHOLHDL 3.5 01/03/2022 0945   LDLCALC 119 (H) 01/03/2022 0945   LABVLDL 9 01/03/2022 0945    No components found for: "NTPROBNP" No results for input(s): "PROBNP" in the last 8760 hours. Recent Labs    07/16/21 1403  TSH 2.090    BMP Recent Labs    07/16/21 1403 01/03/22 0945  NA 137 138  K 4.1 4.0  CL 98 101  CO2 24 21  GLUCOSE 148* 100*  BUN 5* 10  CREATININE 0.79 0.79  CALCIUM 9.1 9.0    HEMOGLOBIN A1C Lab Results  Component Value Date   HGBA1C 5.1 01/03/2022    IMPRESSION:    ICD-10-CM   1. Dyspnea on exertion  R06.09     2. Benign hypertension  I10 losartan (COZAAR) 50 MG tablet    Basic metabolic panel    3. Family history of premature CAD  Z82.49     4. OSA on CPAP  G47.33        RECOMMENDATIONS: VERBIE BOUTROS is a 47 y.o. Caucasian female whose past medical history and cardiac risk factors include: Family history of premature CAD, hypertension, OSA, obesity.   Dyspnea on exertion Less  noticeable since starting losartan.  Echo: Preserved LVEF, normal diastolic function, no significant valvular heart disease. GXT: Low risk. Total CAC 0 Educated her on importance of improving her modifiable cardiovascular risk factors. Will increase losartan to 50 mg p.o. every morning.  Labs in 1 week to evaluate kidney function.  Benign hypertension Improving but not at goal. Increase losartan dose as noted  above. Reemphasized importance of low-salt diet.  OSA on CPAP Reemphasized the importance of device compliance.   FINAL MEDICATION LIST END OF ENCOUNTER: Meds ordered this encounter  Medications   losartan (COZAAR) 50 MG tablet    Sig: Take 1 tablet (50 mg total) by mouth in the morning.    Dispense:  180 tablet    Refill:  1    Medications Discontinued During This Encounter  Medication Reason   Vitamin D, Ergocalciferol, (DRISDOL) 1.25 MG (50000 UNIT) CAPS capsule Completed Course   ZINC OXIDE PO Patient Preference   losartan (COZAAR) 25 MG tablet Reorder     Current Outpatient Medications:    acyclovir (ZOVIRAX) 800 MG tablet, Take 800 mg by mouth as needed., Disp: , Rfl:    buPROPion (WELLBUTRIN XL) 300 MG 24 hr tablet, TAKE 1 TABLET BY MOUTH EVERY DAY, Disp: 90 tablet, Rfl: 3   cholecalciferol (VITAMIN D3) 25 MCG (1000 UNIT) tablet, Take 1,000 Units by mouth daily., Disp: , Rfl:    ibuprofen (ADVIL) 200 MG tablet, Take 200 mg by mouth as needed., Disp: , Rfl:    loratadine (CLARITIN) 10 MG tablet, Take 10 mg by mouth daily., Disp: , Rfl:    multivitamin-iron-minerals-folic acid (CENTRUM) chewable tablet, Chew 1 tablet by mouth daily., Disp: , Rfl:    pantoprazole (PROTONIX) 20 MG tablet, TAKE 1 TABLET BY MOUTH DAILY AS  NEEDED, Disp: 90 tablet, Rfl: 3   vitamin B-12 (CYANOCOBALAMIN) 100 MCG tablet, Take 100 mcg by mouth daily., Disp: , Rfl:    losartan (COZAAR) 50 MG tablet, Take 1 tablet (50 mg total) by mouth in the morning., Disp: 180 tablet, Rfl: 1  Orders Placed This Encounter  Procedures   Basic metabolic panel    There are no Patient Instructions on file for this visit.   --Continue cardiac medications as reconciled in final medication list. --Return in about 1 year (around 06/05/2023) for Follow up, Dyspnea, BP. or sooner if needed. --Continue follow-up with your primary care physician regarding the management of your other chronic comorbid  conditions.  Patient's questions and concerns were addressed to her satisfaction. She voices understanding of the instructions provided during this encounter.   This note was created using a voice recognition software as a result there may be grammatical errors inadvertently enclosed that do not reflect the nature of this encounter. Every attempt is made to correct such errors.  Rex Kras, [place], [facility]  Pager: [phone] Office: [phone]

## 2022-06-12 ENCOUNTER — Encounter: Payer: Self-pay | Admitting: Nurse Practitioner

## 2022-06-13 ENCOUNTER — Ambulatory Visit (INDEPENDENT_AMBULATORY_CARE_PROVIDER_SITE_OTHER): Payer: Managed Care, Other (non HMO) | Admitting: Nurse Practitioner

## 2022-06-13 ENCOUNTER — Encounter: Payer: Self-pay | Admitting: Nurse Practitioner

## 2022-06-13 VITALS — BP 122/84 | HR 77 | Temp 98.6°F | Ht 67.0 in | Wt 212.4 lb

## 2022-06-13 DIAGNOSIS — R52 Pain, unspecified: Secondary | ICD-10-CM | POA: Diagnosis not present

## 2022-06-13 LAB — POCT INFLUENZA A/B
Influenza A, POC: NEGATIVE
Influenza B, POC: NEGATIVE

## 2022-06-13 LAB — POC COVID19 BINAXNOW: SARS Coronavirus 2 Ag: NEGATIVE

## 2022-06-13 NOTE — Progress Notes (Signed)
Acute Office Visit  Subjective:     Patient ID: Shelby Rios, female    DOB: 1976-02-25, 47 y.o.   MRN: [id number]  Chief Complaint  Patient presents with   Nasal Congestion    Headaches, exposure to Covid and Flu    HPI Patient is in today for body aches with recent exposure to covid and flu.   UPPER RESPIRATORY TRACT INFECTION  Fever: no Cough: yes Shortness of breath: no Wheezing: no Chest pain:  soreness on right side of chest last night Chest tightness: no Chest congestion: no Nasal congestion: yes Runny nose: no Post nasal drip: no Sneezing: no Sore throat: no Swollen glands: no Sinus pressure: no Headache: yes Face pain: no Toothache: no Ear pain: no bilateral Ear pressure: yes bilateral Eyes red/itching:no Eye drainage/crusting: no  Vomiting: no Rash: no Fatigue: yes Sick contacts: yes - daughter has flu and covid Strep contacts: no  Context: stable Recurrent sinusitis: no Relief with OTC cold/cough medications: no  Treatments attempted: tylenol cold and flu   ROS See pertinent positives and negatives per HPI.     Objective:    BP 122/84 (BP Location: Right Arm)   Pulse 77   Temp 98.6 F (37 C)   Ht '5\' 7"'$  (1.702 m)   Wt 212 lb 6.4 oz (96.3 kg)   LMP 07/22/2006   SpO2 96%   BMI 33.27 kg/m    Physical Exam Vitals and nursing note reviewed.  Constitutional:      General: She is not in acute distress.    Appearance: Normal appearance.  HENT:     Head: Normocephalic.     Right Ear: Tympanic membrane, ear canal and external ear normal.     Left Ear: Tympanic membrane, ear canal and external ear normal.  Eyes:     Conjunctiva/sclera: Conjunctivae normal.  Cardiovascular:     Rate and Rhythm: Normal rate and regular rhythm.     Pulses: Normal pulses.     Heart sounds: Normal heart sounds.  Pulmonary:     Effort: Pulmonary effort is normal.     Breath sounds: Normal breath sounds.  Musculoskeletal:     Cervical back: Normal range  of motion and neck supple. No tenderness.  Lymphadenopathy:     Cervical: No cervical adenopathy.  Skin:    General: Skin is warm.  Neurological:     General: No focal deficit present.     Mental Status: She is alert and oriented to person, place, and time.  Psychiatric:        Mood and Affect: Mood normal.        Behavior: Behavior normal.        Thought Content: Thought content normal.        Judgment: Judgment normal.     Results for orders placed or performed in visit on 06/13/22  POC COVID-19 BinaxNow  Result Value Ref Range   SARS Coronavirus 2 Ag Negative Negative  POCT Influenza A/B  Result Value Ref Range   Influenza A, POC Negative Negative   Influenza B, POC Negative Negative        Assessment & Plan:   Problem List Items Addressed This Visit   None Visit Diagnoses     Body aches    -  Primary   Flu and covid negative in office. Can repeat covid testing at home Saturday to confirm. Encourage fluids, rest. Tylenol/ibuprofen prn body aches.   Relevant Orders   POC COVID-19 BinaxNow (Completed)  POCT Influenza A/B (Completed)       No orders of the defined types were placed in this encounter.   Return if symptoms worsen or fail to improve.  Charyl Dancer, NP

## 2022-06-13 NOTE — Patient Instructions (Signed)
It was great to see you!  You tested negative for covid and flu today.   You can still keeping tylenol cold and flu and start flonase nasal spray daily to help with your ears.   You can test again for covid on Saturday just to make sure.   Let's follow-up if your symptoms worsen or don't improve.   Take care,  Vance Peper, NP

## 2022-06-14 ENCOUNTER — Other Ambulatory Visit: Payer: Self-pay | Admitting: Nurse Practitioner

## 2022-06-14 ENCOUNTER — Encounter: Payer: Self-pay | Admitting: Nurse Practitioner

## 2022-06-14 DIAGNOSIS — F325 Major depressive disorder, single episode, in full remission: Secondary | ICD-10-CM

## 2022-06-17 ENCOUNTER — Ambulatory Visit (INDEPENDENT_AMBULATORY_CARE_PROVIDER_SITE_OTHER): Payer: Managed Care, Other (non HMO) | Admitting: Nurse Practitioner

## 2022-06-17 ENCOUNTER — Encounter: Payer: Self-pay | Admitting: Nurse Practitioner

## 2022-06-17 VITALS — BP 119/80 | HR 78 | Temp 98.4°F | Resp 16 | Ht 67.0 in | Wt 213.8 lb

## 2022-06-17 DIAGNOSIS — F4321 Adjustment disorder with depressed mood: Secondary | ICD-10-CM

## 2022-06-17 DIAGNOSIS — Z8249 Family history of ischemic heart disease and other diseases of the circulatory system: Secondary | ICD-10-CM

## 2022-06-17 DIAGNOSIS — F3341 Major depressive disorder, recurrent, in partial remission: Secondary | ICD-10-CM

## 2022-06-17 MED ORDER — BUSPIRONE HCL 5 MG PO TABS: 5.0000 mg | ORAL_TABLET | Freq: Two times a day (BID) | ORAL | 5 refills | Status: DC

## 2022-06-17 NOTE — Progress Notes (Signed)
Established Patient Visit  Patient: Shelby Rios   DOB: June 26, 1975   47 y.o. Female  MRN: [id number] Visit Date: 06/17/2022  Subjective:    Chief Complaint  Patient presents with   Medical Management of Chronic Issues    Medication Change-  She doesn't think the Wellbutrin is working and would like to try something different.   HPI Depression, major, single episode, complete remission ([place]) Reports decline in mood since 02/2022: unexplained Outburst of crying, Difficulty staying asleep, wakes up at 2am and difficulty falling asleep and Eye twitching,  Reports she had eye exam by ophthalmology: normal per patient. Onset with fulltime care of elderly uncle who passed away 2022-06-12. She also lost her maternal GM 2weeks after that-CHF. She now has her father in law in hospice due to end stage dementia. She plans to start individual grief counseling through hospice Denies any SI/HI/hallucinations.  Current use of wellbutrin '300mg'$  daily Previous use of paxil and zolft-med discontinued due to fatigue and daytime somnolence. She agreed to start buspar '5mg'$  BID. Advised about possible side effects. Advised to proceed with grief counseling Advised to resume daily exercise. F/up in 2weeks Consider switch wellbutrin to effexor if no improvement in 12month Family history of premature CAD Normal Lp A and LDL at 119 Eval by cardiology: normal stress test and echocardiogram. She is to f/up with cardiology     06/17/2022    2:22 PM 01/03/2022    9:36 AM 09/05/2021    8:19 AM  Depression screen PHQ 2/9  Decreased Interest 1 1 0  Down, Depressed, Hopeless 1 1 0  PHQ - 2 Score 2 2 0  Altered sleeping 2 0 0  Tired, decreased energy 2 0 0  Change in appetite 0 0 0  Feeling bad or failure about yourself  0 0 0  Trouble concentrating 2 1 0  Moving slowly or fidgety/restless 0 0 0  Suicidal thoughts 0 0 0  PHQ-9 Score 8 3 0  Difficult doing work/chores Somewhat difficult Somewhat  difficult Not difficult at all       06/17/2022    2:23 PM 01/03/2022    9:36 AM 09/05/2021    8:20 AM 01/01/2021    2:23 PM  GAD 7 : Generalized Anxiety Score  Nervous, Anxious, on Edge 2 1 0 1  Control/stop worrying 1 0 0 0  Worry too much - different things 1 1 0 0  Trouble relaxing 3 1 0 0  Restless 0 0 0 0  Easily annoyed or irritable 1 0 0 0  Afraid - awful might happen 0 0 0 0  Total GAD 7 Score 8 3 0 1  Anxiety Difficulty Not difficult at all Somewhat difficult Not difficult at all Not difficult at all   Reviewed medical, surgical, and social history today  Medications: Outpatient Medications Prior to Visit  Medication Sig   acyclovir (ZOVIRAX) 800 MG tablet Take 800 mg by mouth as needed.   buPROPion (WELLBUTRIN XL) 300 MG 24 hr tablet TAKE 1 TABLET BY MOUTH EVERY DAY   cholecalciferol (VITAMIN D3) 25 MCG (1000 UNIT) tablet Take 1,000 Units by mouth daily.   ibuprofen (ADVIL) 200 MG tablet Take 200 mg by mouth as needed.   loratadine (CLARITIN) 10 MG tablet Take 10 mg by mouth daily.   losartan (COZAAR) 50 MG tablet Take 1 tablet (50 mg total) by mouth in the morning.  multivitamin-iron-minerals-folic acid (CENTRUM) chewable tablet Chew 1 tablet by mouth daily.   pantoprazole (PROTONIX) 20 MG tablet TAKE 1 TABLET BY MOUTH DAILY AS  NEEDED   vitamin B-12 (CYANOCOBALAMIN) 100 MCG tablet Take 100 mcg by mouth daily.   No facility-administered medications prior to visit.   Reviewed past medical and social history.   ROS per HPI above      Objective:  BP 119/80 (BP Location: Left Arm, Patient Position: Sitting, Cuff Size: Large)   Pulse 78   Temp 98.4 F (36.9 C) (Temporal)   Resp 16   Ht '5\' 7"'$  (1.702 m)   Wt 213 lb 12.8 oz (97 kg)   LMP 07/22/2006   SpO2 98%   BMI 33.49 kg/m      Physical Exam Vitals reviewed.  Neurological:     Mental Status: She is alert and oriented to person, place, and time.  Psychiatric:        Mood and Affect: Mood normal.         Behavior: Behavior normal.        Thought Content: Thought content normal.     No results found for any visits on 06/17/22.    Assessment & Plan:    Problem List Items Addressed This Visit       Other   Depression, major, single episode, complete remission ([place]) - Primary    Reports decline in mood since 02/2022: unexplained Outburst of crying, Difficulty staying asleep, wakes up at 2am and difficulty falling asleep and Eye twitching,  Reports she had eye exam by ophthalmology: normal per patient. Onset with fulltime care of elderly uncle who passed away 2022/06/10. She also lost her maternal GM 2weeks after that-CHF. She now has her father in law in hospice due to end stage dementia. She plans to start individual grief counseling through hospice Denies any SI/HI/hallucinations.  Current use of wellbutrin '300mg'$  daily Previous use of paxil and zolft-med discontinued due to fatigue and daytime somnolence. She agreed to start buspar '5mg'$  BID. Advised about possible side effects. Advised to proceed with grief counseling Advised to resume daily exercise. F/up in 2weeks Consider switch wellbutrin to effexor if no improvement in 48month     Relevant Medications   busPIRone (BUSPAR) 5 MG tablet   Family history of premature CAD    Normal Lp A and LDL at 119 Eval by cardiology: normal stress test and echocardiogram. She is to f/up with cardiology      Other Visit Diagnoses     Grief          Return in about 2 weeks (around 07/01/2022) for depression and anxiety.     CWilfred Lacy NP

## 2022-06-17 NOTE — Assessment & Plan Note (Addendum)
Normal Lp A and LDL at 119 Eval by cardiology: normal stress test and echocardiogram. She is to f/up with cardiology

## 2022-06-17 NOTE — Assessment & Plan Note (Addendum)
Reports decline in mood since 02/2022: unexplained Outburst of crying, Difficulty staying asleep, wakes up at 2am and difficulty falling asleep and Eye twitching,  Reports she had eye exam by ophthalmology: normal per patient. Onset with fulltime care of elderly uncle who passed away 19-Jun-2022. She also lost her maternal GM 2weeks after that-CHF. She now has her father in law in hospice due to end stage dementia. She plans to start individual grief counseling through hospice Denies any SI/HI/hallucinations.  Current use of wellbutrin '300mg'$  daily Previous use of paxil and zolft-med discontinued due to fatigue and daytime somnolence. She agreed to start buspar '5mg'$  BID. Advised about possible side effects. Advised to proceed with grief counseling Advised to resume daily exercise. F/up in 2weeks Consider switch wellbutrin to effexor if no improvement in 67month

## 2022-06-25 ENCOUNTER — Ambulatory Visit: Payer: Managed Care, Other (non HMO) | Admitting: Nurse Practitioner

## 2022-07-01 ENCOUNTER — Ambulatory Visit: Payer: Managed Care, Other (non HMO) | Admitting: Nurse Practitioner

## 2022-07-04 ENCOUNTER — Ambulatory Visit: Payer: Managed Care, Other (non HMO) | Admitting: Nurse Practitioner

## 2022-07-05 ENCOUNTER — Encounter: Payer: Self-pay | Admitting: Nurse Practitioner

## 2022-07-05 ENCOUNTER — Ambulatory Visit (INDEPENDENT_AMBULATORY_CARE_PROVIDER_SITE_OTHER): Payer: Managed Care, Other (non HMO) | Admitting: Nurse Practitioner

## 2022-07-05 VITALS — BP 118/80 | HR 80 | Temp 98.0°F | Resp 16 | Ht 67.0 in | Wt 213.0 lb

## 2022-07-05 DIAGNOSIS — F331 Major depressive disorder, recurrent, moderate: Secondary | ICD-10-CM

## 2022-07-05 DIAGNOSIS — F3341 Major depressive disorder, recurrent, in partial remission: Secondary | ICD-10-CM | POA: Insufficient documentation

## 2022-07-05 MED ORDER — VENLAFAXINE HCL ER 37.5 MG PO CP24: 37.5000 mg | ORAL_CAPSULE | Freq: Every day | ORAL | 5 refills | Status: DC

## 2022-07-05 NOTE — Assessment & Plan Note (Signed)
Stable mood but she does not think wellbutrin is effective No adverse effects with buspar Reports persistent fragmented sleep, wakes up at 2am and unable to resume sleep Has appt with therapist in 2weeks No SI or HI  Advised to try Melatonin 10mg  or magnesium glycinate 1tab for sleep as needed Maintain buspar dose Stop wellbutrin Start effexor F/up in 64month

## 2022-07-05 NOTE — Progress Notes (Signed)
                Established Patient Visit  Patient: Shelby Rios   DOB: 1976-04-03   47 y.o. Female  MRN: [id number] Visit Date: 07/05/2022  Subjective:    Chief Complaint  Patient presents with   Depression    2 week f/u -  Still not sleeping through the night    Depression        Depression Stable mood but she does not think wellbutrin is effective No adverse effects with buspar Reports persistent fragmented sleep, wakes up at 2am and unable to resume sleep Has appt with therapist in 2weeks No SI or HI  Advised to try Melatonin 10mg  or magnesium glycinate 1tab for sleep as needed Maintain buspar dose Stop wellbutrin Start effexor F/up in 74month  Reviewed medical, surgical, and social history today  Medications: Outpatient Medications Prior to Visit  Medication Sig   acyclovir (ZOVIRAX) 800 MG tablet Take 800 mg by mouth as needed.   busPIRone (BUSPAR) 5 MG tablet Take 1 tablet (5 mg total) by mouth 2 (two) times daily.   cholecalciferol (VITAMIN D3) 25 MCG (1000 UNIT) tablet Take 1,000 Units by mouth daily.   ibuprofen (ADVIL) 200 MG tablet Take 200 mg by mouth as needed.   loratadine (CLARITIN) 10 MG tablet Take 10 mg by mouth daily.   losartan (COZAAR) 50 MG tablet Take 1 tablet (50 mg total) by mouth in the morning.   multivitamin-iron-minerals-folic acid (CENTRUM) chewable tablet Chew 1 tablet by mouth daily.   pantoprazole (PROTONIX) 20 MG tablet TAKE 1 TABLET BY MOUTH DAILY AS  NEEDED   vitamin B-12 (CYANOCOBALAMIN) 100 MCG tablet Take 100 mcg by mouth daily.   [DISCONTINUED] buPROPion (WELLBUTRIN XL) 300 MG 24 hr tablet TAKE 1 TABLET BY MOUTH EVERY DAY   No facility-administered medications prior to visit.   Reviewed past medical and social history.   ROS per HPI above      Objective:  BP 118/80 (BP Location: Left Arm, Patient Position: Sitting, Cuff Size: Large)   Pulse 80   Temp 98 F (36.7 C) (Temporal)   Resp 16   Ht 5\' 7"  (1.702 m)   Wt 213 lb  (96.6 kg)   LMP 07/22/2006   SpO2 97%   BMI 33.36 kg/m      Physical Exam Vitals reviewed.  Cardiovascular:     Rate and Rhythm: Normal rate.     Pulses: Normal pulses.  Pulmonary:     Effort: Pulmonary effort is normal.  Neurological:     Mental Status: She is alert and oriented to person, place, and time.  Psychiatric:        Mood and Affect: Mood normal.        Behavior: Behavior normal.        Thought Content: Thought content normal.     No results found for any visits on 07/05/22.    Assessment & Plan:    Problem List Items Addressed This Visit       Other   Recurrent major depressive disorder, in partial remission ([place]) - Primary   Relevant Medications   venlafaxine XR (EFFEXOR XR) 37.5 MG 24 hr capsule   Return in about 4 weeks (around 08/02/2022) for depression and anxiety (complete PHQ/GAD form).     Wilfred Lacy, NP

## 2022-07-05 NOTE — Patient Instructions (Addendum)
Melatonin 10mg  or magnesium glycinate 1tab for sleep as needed Maintain buspar dose Stop wellbutrin Start effexor

## 2022-07-09 ENCOUNTER — Encounter: Payer: Self-pay | Admitting: Nurse Practitioner

## 2022-07-09 ENCOUNTER — Other Ambulatory Visit: Payer: Self-pay | Admitting: Nurse Practitioner

## 2022-07-09 DIAGNOSIS — F3341 Major depressive disorder, recurrent, in partial remission: Secondary | ICD-10-CM

## 2022-07-11 MED ORDER — BUSPIRONE HCL 5 MG PO TABS: 5.0000 mg | ORAL_TABLET | Freq: Three times a day (TID) | ORAL | 1 refills | Status: DC

## 2022-07-11 MED ORDER — BUPROPION HCL ER (XL) 300 MG PO TB24: 300.0000 mg | ORAL_TABLET | Freq: Every day | ORAL | 1 refills | Status: DC

## 2022-07-11 NOTE — Addendum Note (Signed)
Addended by: Leana Gamer on: 07/11/2022 02:25 PM   Modules accepted: Orders

## 2022-08-02 ENCOUNTER — Encounter: Payer: Self-pay | Admitting: Nurse Practitioner

## 2022-08-02 ENCOUNTER — Ambulatory Visit (INDEPENDENT_AMBULATORY_CARE_PROVIDER_SITE_OTHER): Payer: Managed Care, Other (non HMO) | Admitting: Nurse Practitioner

## 2022-08-02 VITALS — BP 120/84 | HR 86 | Temp 98.7°F | Resp 16 | Ht 67.0 in | Wt 215.2 lb

## 2022-08-02 DIAGNOSIS — F331 Major depressive disorder, recurrent, moderate: Secondary | ICD-10-CM | POA: Diagnosis not present

## 2022-08-02 NOTE — Progress Notes (Signed)
                Established Patient Visit  Patient: Shelby Rios   DOB: [birth year]   47 y.o. Female  MRN: [id number] Visit Date: 08/02/2022  Subjective:    Chief Complaint  Patient presents with   Depression    She's about the same.  She is getting sleep at night.  Started therapy and has another appointment at the end of month   HPI Depression No change in mood with wellbutrin and buspar. Improved sleep with OTC remedy Unable to tolerate effexor (worsen mood) No SI/HI/ hallucination  Maintain med doses Entered referral to psychology and psychiatry  Reviewed medical, surgical, and social history today  Medications: Outpatient Medications Prior to Visit  Medication Sig   acyclovir (ZOVIRAX) 800 MG tablet Take 800 mg by mouth as needed.   buPROPion (WELLBUTRIN XL) 300 MG 24 hr tablet Take 1 tablet (300 mg total) by mouth daily.   busPIRone (BUSPAR) 5 MG tablet Take 1 tablet (5 mg total) by mouth 3 (three) times daily.   cholecalciferol (VITAMIN D3) 25 MCG (1000 UNIT) tablet Take 1,000 Units by mouth daily.   ibuprofen (ADVIL) 200 MG tablet Take 200 mg by mouth as needed.   loratadine (CLARITIN) 10 MG tablet Take 10 mg by mouth daily.   losartan (COZAAR) 50 MG tablet Take 1 tablet (50 mg total) by mouth in the morning.   multivitamin-iron-minerals-folic acid (CENTRUM) chewable tablet Chew 1 tablet by mouth daily.   pantoprazole (PROTONIX) 20 MG tablet TAKE 1 TABLET BY MOUTH DAILY AS  NEEDED   vitamin B-12 (CYANOCOBALAMIN) 100 MCG tablet Take 100 mcg by mouth daily.   No facility-administered medications prior to visit.   Reviewed past medical and social history.   ROS per HPI above      Objective:  BP 120/84 (BP Location: Right Arm, Patient Position: Sitting, Cuff Size: Large)   Pulse 86   Temp 98.7 F (37.1 C) (Temporal)   Resp 16   Ht 5\' 7"  (1.702 m)   Wt 215 lb 3.2 oz (97.6 kg)   LMP 07/22/2006   SpO2 98%   BMI 33.71 kg/m      Physical Exam Constitutional:       General: She is not in acute distress. Cardiovascular:     Rate and Rhythm: Normal rate.     Pulses: Normal pulses.  Pulmonary:     Effort: Pulmonary effort is normal.  Neurological:     Mental Status: She is alert and oriented to person, place, and time.  Psychiatric:        Mood and Affect: Mood normal.        Behavior: Behavior normal.        Thought Content: Thought content normal.     No results found for any visits on 08/02/22.    Assessment & Plan:    Problem List Items Addressed This Visit       Other   Depression - Primary    No change in mood with wellbutrin and buspar. Improved sleep with OTC remedy Unable to tolerate effexor (worsen mood) No SI/HI/ hallucination  Maintain med doses Entered referral to psychology and psychiatry      Relevant Orders   Ambulatory referral to Psychiatry   Ambulatory referral to Psychology   Return in about 5 months (around 01/06/2023) for CPE (fasting).     Alysia Penna, NP

## 2022-08-02 NOTE — Assessment & Plan Note (Addendum)
No change in mood with wellbutrin and buspar. Improved sleep with OTC remedy Unable to tolerate effexor (worsen mood) No SI/HI/ hallucination  Maintain med doses Entered referral to psychology and psychiatry

## 2022-08-02 NOTE — Patient Instructions (Signed)
Maintain current medications.  How to Increase Your Level of Physical Activity Getting regular physical activity is important for your overall health and well-being. Most people do not get enough exercise. There are easy ways to increase your level of physical activity, even if you have not been very active in the past or if you are just starting out. What are the benefits of physical activity? Physical activity has many short-term and long-term benefits. Being active on a regular basis can improve your physical and mental health as well as provide other benefits. Physical health benefits Helping you lose weight or maintain a healthy weight. Strengthening your muscles and bones. Reducing your risk of certain long-term (chronic) diseases, including heart disease, cancer, and diabetes. Being able to move around more easily and for longer periods of time without getting tired (increased endurance or stamina). Improving your ability to fight off illness (enhanced immunity). Being able to sleep better. Helping you stay healthy as you get older, including: Helping you stay mobile, or capable of walking and moving around. Preventing accidents, such as falls. Increasing life expectancy. Mental health benefits Boosting your mood and improving your self-esteem. Lowering your chance of having mental health problems, such as depression or anxiety. Helping you feel good about your body. Other benefits Finding new sources of fun and enjoyment. Meeting new people who share a common interest. Before you begin If you have a chronic illness or have not been active for a while, check with your health care provider about how to get started. Ask your health care provider what activities are safe for you. Start out slowly. Walking or doing some simple chair exercises is a good place to start, especially if you have not been active before or for a long time. Set goals that you can work toward. Ask your health  care provider how much exercise is best for you. In general, most adults should: Do moderate-intensity exercise for at least 150 minutes each week (30 minutes on most days of the week) or vigorous exercise for at least 75 minutes each week, or a combination of these. Moderate-intensity exercise can include walking at a quick pace, biking, yoga, water aerobics, or gardening. Vigorous exercise involves activities that take more effort, such as jogging or running, playing sports, swimming laps, or jumping rope. Do strength exercises on at least 2 days each week. This can include weight lifting, body weight exercises, and resistance-band exercises. How to be more physically active Make a plan  Try to find activities that you enjoy. You are more likely to commit to an exercise routine if it does not feel like a chore. If you have bone or joint problems, choose low-impact exercises, like walking or swimming. Use these tips for being successful with an exercise plan: Find a workout partner for accountability. Join a group or class, such as an aerobics class, cycling class, or sports team. Make family time active. Go for a walk, bike, or swim. Include a variety of exercises each week. Consider using a fitness tracker, such as a mobile phone app or a device worn like a watch, that will count the number of steps you take each day. Many people strive to reach 10,000 steps a day. Find ways to be active in your daily routines Besides your formal exercise plans, you can find ways to do physical activity during your daily routines, such as: Walking or biking to work or to the store. Taking the stairs instead of the elevator. Parking farther away from  the door at work or at the store. Planning walking meetings. Walking around while you are on the phone. Where to find more information Centers for Disease Control and Prevention: [employer].pt [employer] Fitness[employer] Sports & Nutrition:  www.fitness.gov ChooseMyPlate: http://www.harvey.com/ Contact a health care provider if: You have headaches, muscle aches, or joint pain that is concerning. You feel dizzy or light-headed while exercising. You faint. You feel your heart skipping, racing, or fluttering. You have chest pain while exercising. Summary Exercise benefits your mind and body at any age, even if you are just starting out. If you have a chronic illness or have not been active for a while, check with your health care provider before increasing your physical activity. Choose activities that are safe and enjoyable for you. Ask your health care provider what activities are safe for you. Start slowly. Tell your health care provider if you have problems as you start to increase your activity level. This information is not intended to replace advice given to you by your health care provider. Make sure you discuss any questions you have with your health care provider. Document Revised: 08/04/2020 Document Reviewed: 08/04/2020 Elsevier Patient Education  2023 [employer].

## 2022-08-05 ENCOUNTER — Encounter: Payer: Self-pay | Admitting: *Deleted

## 2022-08-28 LAB — BASIC METABOLIC PANEL
BUN/Creatinine Ratio: 14 (ref 9–23)
BUN: 11 mg/dL (ref 6–24)
CO2: 24 mmol/L (ref 20–29)
Calcium: 9 mg/dL (ref 8.7–10.2)
Chloride: 100 mmol/L (ref 96–106)
Creatinine, Ser: 0.79 mg/dL (ref 0.57–1.00)
Glucose: 88 mg/dL (ref 70–99)
Potassium: 4.8 mmol/L (ref 3.5–5.2)
Sodium: 137 mmol/L (ref 134–144)
eGFR: 93 mL/min/{1.73_m2} (ref >59)

## 2022-08-28 LAB — LIPID PANEL WITH LDL/HDL RATIO
Cholesterol, Total: 183 mg/dL (ref 100–199)
HDL: 52 mg/dL (ref >39)
LDL Chol Calc (NIH): 118 mg/dL — ABNORMAL HIGH (ref 0–99)
LDL/HDL Ratio: 2.3 ratio (ref 0.0–3.2)
Triglycerides: 69 mg/dL (ref 0–149)
VLDL Cholesterol Cal: 13 mg/dL (ref 5–40)

## 2022-09-23 ENCOUNTER — Ambulatory Visit (HOSPITAL_BASED_OUTPATIENT_CLINIC_OR_DEPARTMENT_OTHER): Payer: 59 | Admitting: Psychiatry

## 2022-09-23 ENCOUNTER — Other Ambulatory Visit: Payer: Self-pay

## 2022-09-23 ENCOUNTER — Encounter (HOSPITAL_COMMUNITY): Payer: Self-pay | Admitting: Psychiatry

## 2022-09-23 ENCOUNTER — Encounter: Payer: Self-pay | Admitting: Cardiology

## 2022-09-23 VITALS — Wt 211.0 lb

## 2022-09-23 DIAGNOSIS — F419 Anxiety disorder, unspecified: Secondary | ICD-10-CM

## 2022-09-23 DIAGNOSIS — F4321 Adjustment disorder with depressed mood: Secondary | ICD-10-CM

## 2022-09-23 DIAGNOSIS — I10 Essential (primary) hypertension: Secondary | ICD-10-CM

## 2022-09-23 DIAGNOSIS — F330 Major depressive disorder, recurrent, mild: Secondary | ICD-10-CM | POA: Diagnosis not present

## 2022-09-23 MED ORDER — BUPROPION HCL ER (XL) 150 MG PO TB24: 150.0000 mg | ORAL_TABLET | ORAL | 0 refills | Status: DC

## 2022-09-23 MED ORDER — LOSARTAN POTASSIUM 50 MG PO TABS: 50.0000 mg | ORAL_TABLET | Freq: Every morning | ORAL | 1 refills | Status: DC

## 2022-09-23 NOTE — Progress Notes (Signed)
Psychiatric Initial Adult Assessment   Virtual Visit via Video Note  I connected with Shelby Rios on 09/23/22 at 11:00 AM EDT by a video enabled telemedicine application and verified that I am speaking with the correct person using two identifiers.  Location: Patient: Home Provider: Home Office   I discussed the limitations of evaluation and management by telemedicine and the availability of in person appointments. The patient expressed understanding and agreed to proceed.    Patient Identification: Shelby Rios MRN:  [id number] Date of Evaluation:  09/23/2022  Referral Source: PCP  Chief Complaint:   Chief Complaint  Patient presents with   Establish Care   Anxiety   Depression   Visit Diagnosis:    ICD-10-CM   1. MDD (major depressive disorder), recurrent episode, mild (HCC)  F33.0 buPROPion (WELLBUTRIN XL) 150 MG 24 hr tablet    2. Grief  F43.21     3. Anxiety  F41.9 buPROPion (WELLBUTRIN XL) 150 MG 24 hr tablet      History of Present Illness: Patient is 47 year old married, employed female who is referred from primary care for the management of anxiety and depression.  Patient struggled with anxiety and depression since her teens and prescribed initially Paxil than Prozac and Zoloft and recently briefly Effexor but not taking Wellbutrin.  She reported her being symptoms is fatigue, lack of motivation, tired, lack of focus, emotionally drained and unable to focus.  She has to write things to do things.  She also reported earlier this year she lost her 71 year old uncle who raised her and her father figure.  She started therapy through hospice.  She was given BuSpar to help with anxiety which she takes as needed.  Patient reported no specific trigger or stress but struggle with anxiety and depression most of her life.  Lately her sleep is better as starting grief counseling but before she was having crying spells, racing thoughts, irritability, sadness.  She admitted  Wellbutrin had helped her a lot but now she feels symptoms are unchanged from the past.  She is struggling to do things.  She denies any hallucination, paranoia, suicidal thoughts.  She denies any panic attack but feels anxious and nervous.  She lives with her husband.  She has 2 adult children and she is also a grandmother of 59-month-old.  She admitted limited social network and does not have a lot of things going on in her life to relax herself.  She does visit her biological mother but admitted had a difficult time in her childhood with her parents.  Her father is deceased at age 71.  Patient told her parents have her when they were very young and she believes they were not ready to raise the kid and she was given to her uncle and aunt where she grew up.  She started taking medication at age 18 and given Paxil which she stopped after a few months.  She again started taking in her mid 59s when Prozac was given but stopped after she got pregnant.  She did very well for a few years until her primary care started her on Zoloft which worked for many years but she noticed fatigued and it was switched to initially Effexor which she took for 4 days but had side effects and then PCP switched to Wellbutrin.  She denies any mania, nightmares, flashback, history of abuse but admitted have a difficult childhood.  So far she is tolerating Wellbutrin and has no side effects.  She is  open to try for medication adjustment.  She had never tested for ADHD but admitted had struggled in the school with average grades.  She did finish her college later in her life which was difficult.  Patient is working at [employer] virtually.  She has been married with her husband for 29 years.  Patient told lately her husband is involved taking care of his father who has dementia.  Associated Signs/Symptoms: Depression Symptoms:  depressed mood, fatigue, difficulty concentrating, anxiety, loss of energy/fatigue, (Hypo) Manic Symptoms:   Distractibility, Anxiety Symptoms:  Excessive Worry, Psychotic Symptoms:   no psychotic symptoms PTSD Symptoms: Negative  Past Psychiatric History: Denies any history of psychiatric inpatient, suicidal attempt, psychosis, mania.  Started taking medication since age 88.  Remember taking Paxil in the beginning then Prozac, Zoloft, Effexor for 4 days and switch to Wellbutrin.  Has not seen psychiatrist since age 39.  No history of drug use.  Previous Psychotropic Medications: Yes   Substance Abuse History in the last 12 months:  No.  Consequences of Substance Abuse: NA  Past Medical History:  Past Medical History:  Diagnosis Date   Abnormal mammogram, unspecified    Abnormal weight gain    Allergy    Anemia    Anxiety    Back pain    Breast pain, right    Depression    Dyspepsia    Edema    Fatigue    GERD (gastroesophageal reflux disease)    H. pylori infection    Hypertension    only during pregnancy   Nonspecific abnormal results of other specified function study    Obesity    Palpitations    Routine general medical examination at a health care facility    Seizures [facility])    couple seisure around age 41- tx with dilantin for a period then d/c   Sleep apnea    c-pap wears   Vitamin B12 deficiency 11/02/2014    Past Surgical History:  Procedure Laterality Date   adenoidectomy     BRCA test     neg   DILATION AND CURETTAGE OF UTERUS     PARTIAL HYSTERECTOMY  04/22/2006   endometriosis, done vaginally, she has her ovaries   TONSILLECTOMY     WISDOM TOOTH EXTRACTION      Family Psychiatric History: Mother has mental disorder.  Family History:  Family History  Problem Relation Age of Onset   Ovarian cancer Mother    Stroke Mother 46   Diabetes Mother    Seizures Mother    Depression Mother    Hypertension Mother    Obesity Mother    Colon polyps Mother    Irritable bowel syndrome Mother    Heart disease Father        CABG age 39   Diabetes Father     Depression Father    Early death Father    Hypertension Father    Obesity Father    Hypokalemia Sister        deceased at 25; was getting regular IV K infusions; long Hx of pseudotumor cerebri   Early death Sister    Breast cancer Maternal Grandmother    Heart attack Other        GF - 35   Coronary artery disease Other        family hx   Hypertension Other        family hx   Diabetes Other        family hx  Colon cancer Neg Hx    Esophageal cancer Neg Hx    Pancreatic cancer Neg Hx    Rectal cancer Neg Hx    Stomach cancer Neg Hx     Social History:   Social History   Socioeconomic History   Marital status: Married    Spouse name: Kim   Number of children: 2   Years of education: Not on file   Highest education level: Bachelor's degree (e.g., BA, AB, BS)  Occupational History   Occupation: Government social research officer: [employer]  Tobacco Use   Smoking status: Never   Smokeless tobacco: Never   Tobacco comments:    nonsmoker  Vaping Use   Vaping Use: Never used  Substance and Sexual Activity   Alcohol use: No   Drug use: No   Sexual activity: Yes    Birth control/protection: Surgical    Comment: hysterectomy  Other Topics Concern   Not on file  Social History Narrative   Divorced since 9/08; 2 children   3 coffees/day       Patient is right-handed. She lives with her husband in a one level home. She drinks 4-6 cups of coffee a day, and 1-2 sodas a week. She participates in boot camp 3 x week and hot yoga 3 x a week, each for 1 hour.   Social Determinants of Health   Financial Resource Strain: Not on file  Food Insecurity: Not on file  Transportation Needs: Not on file  Physical Activity: Not on file  Stress: Not on file  Social Connections: Not on file    Additional Social History: Patient born and raised in [place].  She reported her parents were very young when they had her.  She saw her mother dealing with mental disorder required  hospitalization for suicidal attempt.  She was never close to her father who died at age 23 due to poor health condition.  Patient married with her husband.  Patient was raised by her uncle and aunt.  Her aunt died 6 years ago and her uncle died earlier this year.    Allergies:  No Known Allergies  Metabolic Disorder Labs: Lab Results  Component Value Date   HGBA1C 5.1 01/03/2022   No results found for: "PROLACTIN" Lab Results  Component Value Date   CHOL 183 08/27/2022   TRIG 69 08/27/2022   HDL 52 08/27/2022   CHOLHDL 3.5 01/03/2022   LDLCALC 118 (H) 08/27/2022   LDLCALC 119 (H) 01/03/2022   Lab Results  Component Value Date   TSH 2.090 07/16/2021    Therapeutic Level Labs: No results found for: "LITHIUM" No results found for: "CBMZ" No results found for: "VALPROATE"  Current Medications: Current Outpatient Medications  Medication Sig Dispense Refill   acyclovir (ZOVIRAX) 800 MG tablet Take 800 mg by mouth as needed.     buPROPion (WELLBUTRIN XL) 300 MG 24 hr tablet Take 1 tablet (300 mg total) by mouth daily. 90 tablet 1   busPIRone (BUSPAR) 5 MG tablet Take 1 tablet (5 mg total) by mouth 3 (three) times daily. 270 tablet 1   cholecalciferol (VITAMIN D3) 25 MCG (1000 UNIT) tablet Take 1,000 Units by mouth daily.     ibuprofen (ADVIL) 200 MG tablet Take 200 mg by mouth as needed.     loratadine (CLARITIN) 10 MG tablet Take 10 mg by mouth daily.     losartan (COZAAR) 50 MG tablet Take 1 tablet (50 mg total) by mouth  in the morning. 180 tablet 1   multivitamin-iron-minerals-folic acid (CENTRUM) chewable tablet Chew 1 tablet by mouth daily.     pantoprazole (PROTONIX) 20 MG tablet TAKE 1 TABLET BY MOUTH DAILY AS  NEEDED 90 tablet 3   vitamin B-12 (CYANOCOBALAMIN) 100 MCG tablet Take 100 mcg by mouth daily.     No current facility-administered medications for this visit.    Musculoskeletal: Strength & Muscle Tone: within normal limits Gait & Station: normal Patient  leans: N/A  Psychiatric Specialty Exam: Review of Systems  Psychiatric/Behavioral:  Positive for decreased concentration.     Weight 211 lb (95.7 kg), last menstrual period 07/22/2006.There is no height or weight on file to calculate BMI.  General Appearance: Casual  Eye Contact:  Good  Speech:  Normal Rate  Volume:  Normal  Mood:  Anxious and Dysphoric  Affect:  Congruent  Thought Process:  Goal Directed  Orientation:  Full (Time, Place, and Person)  Thought Content:  Rumination  Suicidal Thoughts:  No  Homicidal Thoughts:  No  Memory:  Immediate;   Good Recent;   Good Remote;   Good  Judgement:  Intact  Insight:  Present  Psychomotor Activity:  Normal  Concentration:  Concentration: Good and Attention Span: Fair  Recall:  Good  Fund of Knowledge:Good  Language: Good  Akathisia:  No  Handed:  Right  AIMS (if indicated):  not done  Assets:  Communication Skills Desire for Improvement Housing Resilience Social Support Talents/Skills Transportation  ADL's:  Intact  Cognition: WNL  Sleep:   better   Screenings: GAD-7    Loss adjuster, chartered Office Visit from 06/17/2022 in [facility] [employer] Visit from 01/03/2022 in [facility] [employer] Video Visit from 09/05/2021 in [facility] [employer] Visit from 01/01/2021 in [facility] [employer] Visit from 12/23/2019 in [facility] [employer]  Total GAD-7 Score 8 3 0 1 1      PHQ2-9    Flowsheet Row Office Visit from 06/17/2022 in [facility] Visit from 01/03/2022 in [facility] Video Visit from 09/05/2021 in [facility] [employer] Video Visit from 08/02/2021 in [facility] [employer] Visit from 01/01/2021 in [facility] [employer]  PHQ-2 Total Score 2 2 0 0 0  PHQ-9 Total Score 8 3 0 5 3       Assessment and Plan: Teres is 47 year old married, employed female with history of depression, anxiety.  Currently taking Wellbutrin XL 300 mg daily which initially helped but now she feels symptoms are the same.  She struggled with fatigue, lack of motivation, sadness, decreased focus, attention and distractibility.  She never had test for ADHD.  She is in a grief counseling through hospice.  I recommend that she can try Wellbutrin up to 450 mg to see if that helps her symptoms as patient did not notice Wellbutrin and that did not help.  We will also refer her for ADHD testing.  I also believe patient should see a therapist to help her coping skills.  Discussed outlets to deal with her chronic depression and anxiety.  Encourage walking, listening music, exercise and the spending time to relax herself.  So far Wellbutrin is not causing any issues.  Recommend to discontinue BuSpar as patient is not taking  on a regular basis.  Discussed safety concern that anytime having active suicidal thoughts or homicidal halogen need to call 911 or go to local emergency room.  I review blood work results, current medication, psychosocial stressors.  She will try an extra Wellbutrin to the maximum dose of 450 mg.  She prefer a local pharmacy and we will follow-up in 3 weeks.  I will also refer her to see a therapist.  Collaboration of Care: Other provider involved in patient's care AEB notes are available in epic to review.  Patient/Guardian was advised Release of Information must be obtained prior to any record release in order to collaborate their care with an outside provider. Patient/Guardian was advised if they have not already done so to contact the registration department to sign all necessary forms in order for [place] to release information regarding their care.   Consent: Patient/Guardian gives verbal consent  for treatment and assignment of benefits for services provided during this visit. Patient/Guardian expressed understanding and agreed to proceed.    Follow Up Instructions:    I discussed the assessment and treatment plan with the patient. The patient was provided an opportunity to ask questions and all were answered. The patient agreed with the plan and demonstrated an understanding of the instructions.   The patient was advised to call back or seek an in-person evaluation if the symptoms worsen or if the condition fails to improve as anticipated.  Cleotis Nipper, MD 6/3/202411:01 AM

## 2022-10-14 ENCOUNTER — Telehealth (HOSPITAL_COMMUNITY): Payer: Managed Care, Other (non HMO) | Admitting: Psychiatry

## 2022-10-15 ENCOUNTER — Other Ambulatory Visit (HOSPITAL_COMMUNITY): Payer: Self-pay | Admitting: Psychiatry

## 2022-10-15 DIAGNOSIS — F419 Anxiety disorder, unspecified: Secondary | ICD-10-CM

## 2022-10-15 DIAGNOSIS — F330 Major depressive disorder, recurrent, mild: Secondary | ICD-10-CM

## 2022-10-16 ENCOUNTER — Telehealth (HOSPITAL_COMMUNITY): Payer: Managed Care, Other (non HMO) | Admitting: Psychiatry

## 2022-10-28 ENCOUNTER — Telehealth (HOSPITAL_BASED_OUTPATIENT_CLINIC_OR_DEPARTMENT_OTHER): Payer: 59 | Admitting: Psychiatry

## 2022-10-28 ENCOUNTER — Encounter (HOSPITAL_COMMUNITY): Payer: Self-pay

## 2022-10-28 ENCOUNTER — Encounter (HOSPITAL_COMMUNITY): Payer: Self-pay | Admitting: Psychiatry

## 2022-10-28 VITALS — Wt 208.0 lb

## 2022-10-28 DIAGNOSIS — F330 Major depressive disorder, recurrent, mild: Secondary | ICD-10-CM | POA: Diagnosis not present

## 2022-10-28 DIAGNOSIS — F419 Anxiety disorder, unspecified: Secondary | ICD-10-CM

## 2022-10-28 MED ORDER — BUPROPION HCL ER (XL) 150 MG PO TB24: 150.0000 mg | ORAL_TABLET | ORAL | 0 refills | Status: DC

## 2022-10-28 MED ORDER — BUPROPION HCL ER (XL) 300 MG PO TB24: 300.0000 mg | ORAL_TABLET | Freq: Every day | ORAL | 0 refills | Status: DC

## 2022-10-28 NOTE — Progress Notes (Signed)
[facility] MD Virtual Progress Note   Patient Location: Home Provider Location: Home Office  I connect with patient by video and verified that I am speaking with correct person by using two identifiers. I discussed the limitations of evaluation and management by telemedicine and the availability of in person appointments. I also discussed with the patient that there may be a patient responsible charge related to this service. The patient expressed understanding and agreed to proceed.  Shelby Rios [id number] 47 y.o.  10/28/2022 11:34 AM  History of Present Illness:  Patient is 47 year old married, employed female who is referred from primary care physician for the management of anxiety and depression.  She was seen 4 weeks ago and at that time she struggled with anxiety, attention, lack of motivation and depression.  She was taking BuSpar and Wellbutrin XL 300 mg daily.  Her PCP tried Effexor she was having side effects.  Despite taking the 300 mg Wellbutrin she struggled with racing thoughts, irritability, and going through grief as recently loss her uncle.  She is doing therapy with hospice.  Patient lives with her husband.  She has 2 adult children and she is also a grandmother of 19-month-old grandchild.  She admitted a lot of stress and anxiety because not able to relax herself.  She has a difficult time losing her uncle who actually raised her.  Patient has limited social network.  We have recommended to increase Wellbutrin and take extra 150 in the morning.  She was taking BuSpar which was recommended to be stopped.  We have referred for ADHD testing to establish the diagnosis as she struggled with lack of motivation, attention, concentration, multitasking.  She is taking extra Wellbutrin most of the days and she noticed improvement in her motivation, mood and anxiety.  She is able to do multitasking and have more energy.  Recently she has to go to [place] to bring her 56 year old  daughter back to [place] as plan was slightly changed and the person who was supposed to come back with her daughter did not come back.  She actually enjoyed the time off from home and able to relax.  So far she is tolerating the medication and reported no tremors, shakes or any EPS.  She is sleeping good.  She recently finished therapy with hospice.  Occasionally she gets emotional but denies any hallucination, paranoia, suicidal thoughts or any anger.  She lost 3 pounds since the last visit.  She had a hard time getting appointment for ADHD testing but hoping to have 1 very soon.  She has no tremors, shakes or any EPS.  Past Psychiatric History: History of depression and anxiety.  Took Paxil, Prozac, Zoloft, Effexor, BuSpar and Wellbutrin.  Medicines are prescribed from PCP.  No history of suicidal attempt, inpatient treatment or mania.   Outpatient Encounter Medications as of 10/28/2022  Medication Sig   acyclovir (ZOVIRAX) 800 MG tablet Take 800 mg by mouth as needed.   buPROPion (WELLBUTRIN XL) 150 MG 24 hr tablet Take 1 tablet (150 mg total) by mouth every morning.   buPROPion (WELLBUTRIN XL) 300 MG 24 hr tablet Take 1 tablet (300 mg total) by mouth daily.   busPIRone (BUSPAR) 5 MG tablet Take 1 tablet (5 mg total) by mouth 3 (three) times daily.   cholecalciferol (VITAMIN D3) 25 MCG (1000 UNIT) tablet Take 1,000 Units by mouth daily.   ibuprofen (ADVIL) 200 MG tablet Take 200 mg by mouth as needed.   loratadine (CLARITIN)  10 MG tablet Take 10 mg by mouth daily.   losartan (COZAAR) 50 MG tablet Take 1 tablet (50 mg total) by mouth in the morning.   multivitamin-iron-minerals-folic acid (CENTRUM) chewable tablet Chew 1 tablet by mouth daily.   pantoprazole (PROTONIX) 20 MG tablet TAKE 1 TABLET BY MOUTH DAILY AS  NEEDED   vitamin B-12 (CYANOCOBALAMIN) 100 MCG tablet Take 100 mcg by mouth daily.   No facility-administered encounter medications on file as of 10/28/2022.    Recent Results (from the  past 2160 hour(s))  Basic Metabolic Panel (BMET)     Status: None   Collection Time: 08/27/22  9:09 AM  Result Value Ref Range   Glucose 88 70 - 99 mg/dL   BUN 11 6 - 24 mg/dL   Creatinine, Ser 0.45 0.57 - 1.00 mg/dL   eGFR 93 >40 JW/JXB/1.47   BUN/Creatinine Ratio 14 9 - 23   Sodium 137 134 - 144 mmol/L   Potassium 4.8 3.5 - 5.2 mmol/L   Chloride 100 96 - 106 mmol/L   CO2 24 20 - 29 mmol/L   Calcium 9.0 8.7 - 10.2 mg/dL  Lipid Panel With LDL/HDL Ratio     Status: Abnormal   Collection Time: 08/27/22  9:12 AM  Result Value Ref Range   Cholesterol, Total 183 100 - 199 mg/dL   Triglycerides 69 0 - 149 mg/dL   HDL 52 >82 mg/dL   VLDL Cholesterol Cal 13 5 - 40 mg/dL   LDL Chol Calc (NIH) 956 (H) 0 - 99 mg/dL   LDL/HDL Ratio 2.3 0.0 - 3.2 ratio    Comment:                                     LDL/HDL Ratio                                             Men  Women                               1/2 Avg.Risk  1.0    1.5                                   Avg.Risk  3.6    3.2                                2X Avg.Risk  6.2    5.0                                3X Avg.Risk  8.0    6.1      Psychiatric Specialty Exam: Physical Exam  Review of Systems  Weight 208 lb (94.3 kg), last menstrual period 07/22/2006.There is no height or weight on file to calculate BMI.  General Appearance: Casual  Eye Contact:  Good  Speech:  Clear and Coherent  Volume:  Normal  Mood:  Anxious  Affect:  Appropriate  Thought Process:  Goal Directed  Orientation:  Full (Time, Place, and Person)  Thought Content:  Logical  Suicidal Thoughts:  No  Homicidal Thoughts:  No  Memory:  Immediate;   Good Recent;   Good Remote;   Good  Judgement:  Intact  Insight:  Good  Psychomotor Activity:  Normal  Concentration:  Concentration: Good and Attention Span: Good  Recall:  Good  Fund of Knowledge:  Good  Language:  Good  Akathisia:  No  Handed:  Right  AIMS (if indicated):     Assets:  Communication  Skills Desire for Improvement Housing Talents/Skills Transportation  ADL's:  Intact  Cognition:  WNL  Sleep:  good     Assessment/Plan: MDD (major depressive disorder), recurrent episode, mild (HCC) - Plan: buPROPion (WELLBUTRIN XL) 150 MG 24 hr tablet, buPROPion (WELLBUTRIN XL) 300 MG 24 hr tablet  Anxiety - Plan: buPROPion (WELLBUTRIN XL) 150 MG 24 hr tablet, buPROPion (WELLBUTRIN XL) 300 MG 24 hr tablet  Patient is taking Wellbutrin XL 300 mg every day and sometimes to take extra 150 which helps her anxiety, depression, focus and attention.  She is in the process of getting ADHD testing.  She is no longer taking BuSpar.  So far no major concern.  Encouraged to continue current with [facility] and have testing for ADHD.  Patient recently finished hospice counseling and she was told if needed that she can call us and we can referred for therapy.  So far patient has no major concern.  She like to have all psychotropic medication sent from our [facility]  We will send a 90-day prescription of Wellbutrin XL 300 mg in the morning and she can also take Wellbutrin XL 150 mg a day then she struggle with attention and focus.  Discussed medication side effects and benefits.  Recommend to call us back if any question or any concern.  Follow-up in 2 months.   Follow Up Instructions:     I discussed the assessment and treatment plan with the patient. The patient was provided an opportunity to ask questions and all were answered. The patient agreed with the plan and demonstrated an understanding of the instructions.   The patient was advised to call back or seek an in-person evaluation if the symptoms worsen or if the condition fails to improve as anticipated.    Collaboration of Care: Other provider involved in patient's care AEB notes are available in epic to review.  Patient/Guardian was advised Release of Information must be obtained prior to any record release in order to collaborate their  care with an outside provider. Patient/Guardian was advised if they have not already done so to contact the registration department to sign all necessary forms in order for [place] to release information regarding their care.   Consent: Patient/Guardian gives verbal consent for treatment and assignment of benefits for services provided during this visit. Patient/Guardian expressed understanding and agreed to proceed.     I provided 28 minutes of non face to face time during this encounter.  Note: This document was prepared by [employer] voice dictation technology and any errors that results from this process are unintentional.    Cleotis Nipper, MD 10/28/2022

## 2022-11-15 ENCOUNTER — Ambulatory Visit (INDEPENDENT_AMBULATORY_CARE_PROVIDER_SITE_OTHER): Payer: Managed Care, Other (non HMO) | Admitting: Nurse Practitioner

## 2022-11-15 ENCOUNTER — Ambulatory Visit (INDEPENDENT_AMBULATORY_CARE_PROVIDER_SITE_OTHER)
Admission: RE | Admit: 2022-11-15 | Discharge: 2022-11-15 | Disposition: A | Discharge status: Home / Self Care | Payer: Managed Care, Other (non HMO) | Source: Ambulatory Visit | Attending: Nurse Practitioner

## 2022-11-15 ENCOUNTER — Encounter: Payer: Self-pay | Admitting: Nurse Practitioner

## 2022-11-15 VITALS — BP 104/78 | HR 66 | Temp 97.9°F | Resp 16 | Ht 67.0 in | Wt 203.0 lb

## 2022-11-15 DIAGNOSIS — W19XXXA Unspecified fall, initial encounter: Secondary | ICD-10-CM

## 2022-11-15 DIAGNOSIS — M25521 Pain in right elbow: Secondary | ICD-10-CM | POA: Diagnosis not present

## 2022-11-15 NOTE — Patient Instructions (Signed)
Go to [address] for elbow x-ray. Use elbow sleeve, tylenol or ibuprofen or voltaren gel as needed for pain

## 2022-11-15 NOTE — Progress Notes (Signed)
Acute Office Visit  Subjective:    Patient ID: Shelby Rios, female    DOB: Dec 08, 1975, 47 y.o.   MRN: [id number]  Chief Complaint  Patient presents with   Elbow Injury    Right elbow     Fall The accident occurred More than 1 week ago. The fall occurred while standing. She fell from a height of 3 to 5 ft. She landed on Concrete. There was no blood loss. The point of impact was the right elbow. The pain is present in the right elbow. The pain is moderate. The symptoms are aggravated by use of injured limb and movement. Associated symptoms include tingling. Pertinent negatives include no numbness. She has tried acetaminophen and NSAID for the symptoms. The treatment provided moderate relief.  Fall 2weeks ago, [place] Seat at home in garage. Slipped on wet floor. Impact on right arm and right hip. No head injury. No LOC Reports bruise on right hip, now improving, resolved hip pain Reports persistent right elbow pain with palpation and movement.  Outpatient Medications Prior to Visit  Medication Sig   acyclovir (ZOVIRAX) 800 MG tablet Take 800 mg by mouth as needed.   buPROPion (WELLBUTRIN XL) 150 MG 24 hr tablet Take 1 tablet (150 mg total) by mouth every morning.   buPROPion (WELLBUTRIN XL) 300 MG 24 hr tablet Take 1 tablet (300 mg total) by mouth daily.   cholecalciferol (VITAMIN D3) 25 MCG (1000 UNIT) tablet Take 1,000 Units by mouth daily.   ibuprofen (ADVIL) 200 MG tablet Take 200 mg by mouth as needed.   loratadine (CLARITIN) 10 MG tablet Take 10 mg by mouth daily.   losartan (COZAAR) 50 MG tablet Take 1 tablet (50 mg total) by mouth in the morning.   multivitamin-iron-minerals-folic acid (CENTRUM) chewable tablet Chew 1 tablet by mouth daily.   pantoprazole (PROTONIX) 20 MG tablet TAKE 1 TABLET BY MOUTH DAILY AS  NEEDED   vitamin B-12 (CYANOCOBALAMIN) 100 MCG tablet Take 100 mcg by mouth daily.   busPIRone (BUSPAR) 5 MG tablet Take 1 tablet (5 mg total) by mouth 3 (three) times daily.    No facility-administered medications prior to visit.   Reviewed past medical and social history.  Review of Systems  Neurological:  Positive for tingling. Negative for numbness.   Per HPI     Objective:    Physical Exam Vitals and nursing note reviewed.  Constitutional:      General: She is not in acute distress. Musculoskeletal:     Right shoulder: Normal.     Right upper arm: Normal.     Right elbow: No swelling or effusion. Normal range of motion. Tenderness present in medial epicondyle and olecranon process. No radial head or lateral epicondyle tenderness.     Right forearm: Normal.     Right wrist: Normal.     Right hand: Normal.  Neurological:     Mental Status: She is alert.    BP 104/78 (BP Location: Left Arm, Patient Position: Sitting, Cuff Size: Large)   Pulse 66   Temp 97.9 F (36.6 C) (Temporal)   Resp 16   Ht 5\' 7"  (1.702 m)   Wt 203 lb (92.1 kg)   LMP 07/22/2006   SpO2 98%   BMI 31.79 kg/m    No results found for any visits on 11/15/22.    Assessment & Plan:   Problem List Items Addressed This Visit   None Visit Diagnoses     Right elbow pain    -  Primary   Relevant Orders   DG Elbow Complete Right   Fall, initial encounter       Relevant Orders   DG Elbow Complete Right     Use elbow sleeve, tylenol or ibuprofen or voltaren gel as needed for pain  No orders of the defined types were placed in this encounter.  Return if symptoms worsen or fail to improve.    Alysia Penna, NP

## 2022-11-19 ENCOUNTER — Encounter: Payer: Self-pay | Admitting: Nurse Practitioner

## 2022-11-20 ENCOUNTER — Other Ambulatory Visit: Payer: Self-pay | Admitting: Nurse Practitioner

## 2022-11-20 ENCOUNTER — Telehealth: Payer: Self-pay | Admitting: Nurse Practitioner

## 2022-11-20 DIAGNOSIS — K219 Gastro-esophageal reflux disease without esophagitis: Secondary | ICD-10-CM

## 2022-11-20 NOTE — Telephone Encounter (Signed)
Pt is wanting a cb on her most recent x-ray. Pt at [phone]

## 2022-11-20 NOTE — Telephone Encounter (Signed)
Advised pt of Charlotte's annotation below. She verbalized understanding.   Anne Ng, NP 11/20/2022 11:09 AM EDT     Negative for fracture. Use elbow sleeve at discussed

## 2022-11-20 NOTE — Telephone Encounter (Signed)
Please advise, see below.   

## 2022-11-20 NOTE — Telephone Encounter (Signed)
Chart supports rx. Last OV: 11/15/2022 Next OV: 01/06/2023

## 2022-12-05 ENCOUNTER — Encounter (INDEPENDENT_AMBULATORY_CARE_PROVIDER_SITE_OTHER): Payer: Self-pay

## 2022-12-30 ENCOUNTER — Telehealth (HOSPITAL_BASED_OUTPATIENT_CLINIC_OR_DEPARTMENT_OTHER): Payer: 59 | Admitting: Psychiatry

## 2022-12-30 ENCOUNTER — Encounter (HOSPITAL_COMMUNITY): Payer: Self-pay | Admitting: Psychiatry

## 2022-12-30 VITALS — Wt 194.0 lb

## 2022-12-30 DIAGNOSIS — F419 Anxiety disorder, unspecified: Secondary | ICD-10-CM | POA: Diagnosis not present

## 2022-12-30 DIAGNOSIS — F330 Major depressive disorder, recurrent, mild: Secondary | ICD-10-CM

## 2022-12-30 MED ORDER — BUPROPION HCL ER (XL) 150 MG PO TB24
150.0000 mg | ORAL_TABLET | ORAL | 0 refills | Status: DC
Start: 2022-12-30 — End: 2023-03-31

## 2022-12-30 MED ORDER — BUPROPION HCL ER (XL) 300 MG PO TB24: 300.0000 mg | ORAL_TABLET | Freq: Every day | ORAL | 0 refills | Status: DC

## 2022-12-30 NOTE — Progress Notes (Signed)
[facility] MD Virtual Progress Note   Patient Location: Home Provider Location: Home Office  I connect with patient by video and verified that I am speaking with correct person by using two identifiers. I discussed the limitations of evaluation and management by telemedicine and the availability of in person appointments. I also discussed with the patient that there may be a patient responsible charge related to this service. The patient expressed understanding and agreed to proceed.  Shelby Rios [id number] 47 y.o.  12/30/2022 10:59 AM  History of Present Illness:  Patient is evaluated by video session.  She is taking Wellbutrin XL 300 mg daily and sometimes she takes 150 before noon when she struggled with attention and focus.  She is frustrated as not able to get the testing for ADHD but finally she had paperwork received from [place] [facility].  She is hoping to submit the paperwork so she can get appointment.  She also had a therapy appointment coming up on November 25 at [facility].  Overall she noticed combination of the medicine helping her depression, anxiety and she able to do things with some difficulty.  Sometimes she have to write notes to remember herself.  She also takes a break to listen to podcast, walking, exercise and that helped her a lot.  Occasionally she had trouble sleeping when she is thinking too much.  She cut down her coffee and not focusing on her food with no artificial preservative.  She like to focus on her general health.  She started walking and able to lost weight since the last visit.  She admitted sometimes overwhelmed as responsible for settling the state of her uncle who died and she struggle with the loss and grief.  She finished therapy with hospice but is still a lot of things she has to do to settle the state.  She work at [employer]  Her job is okay.  She had a good support from her husband.  Her daughter started as a Holiday representative  at [employer].  She she has another child and she is also grandmother of 36-month-old.  Sometimes she struggled with focus and multitasking but hoping ADHD results help to establish diagnosis.  She has no tremors or shakes or any EPS.  She denies any crying spells or any feeling of hopelessness or worthlessness.  She has appointment coming up for lab work to check the cholesterol.  She denies any hallucination, paranoia or any suicidal thoughts.  Past Psychiatric History: History of depression and anxiety.  Took Paxil, Prozac, Zoloft, Effexor, BuSpar and Wellbutrin.  Medicines are prescribed from PCP.  No history of suicidal attempt, inpatient treatment or mania.    Outpatient Encounter Medications as of 12/30/2022  Medication Sig   acyclovir (ZOVIRAX) 800 MG tablet Take 800 mg by mouth as needed.   buPROPion (WELLBUTRIN XL) 150 MG 24 hr tablet Take 1 tablet (150 mg total) by mouth every morning.   buPROPion (WELLBUTRIN XL) 300 MG 24 hr tablet Take 1 tablet (300 mg total) by mouth daily.   cholecalciferol (VITAMIN D3) 25 MCG (1000 UNIT) tablet Take 1,000 Units by mouth daily.   ibuprofen (ADVIL) 200 MG tablet Take 200 mg by mouth as needed.   loratadine (CLARITIN) 10 MG tablet Take 10 mg by mouth daily.   losartan (COZAAR) 50 MG tablet Take 1 tablet (50 mg total) by mouth in the morning.   multivitamin-iron-minerals-folic acid (CENTRUM) chewable tablet Chew 1 tablet by mouth daily.  pantoprazole (PROTONIX) 20 MG tablet TAKE 1 TABLET BY MOUTH DAILY AS  NEEDED   vitamin B-12 (CYANOCOBALAMIN) 100 MCG tablet Take 100 mcg by mouth daily.   No facility-administered encounter medications on file as of 12/30/2022.    No results found for this or any previous visit (from the past 2160 hour(s)).   Psychiatric Specialty Exam: Physical Exam  Review of Systems  Weight 194 lb (88 kg), last menstrual period 07/22/2006.There is no height or weight on file to calculate BMI.  General Appearance: Casual  Eye  Contact:  Good  Speech:  Clear and Coherent  Volume:  Normal  Mood:  Euthymic  Affect:  Appropriate  Thought Process:  Goal Directed  Orientation:  Full (Time, Place, and Person)  Thought Content:  Rumination  Suicidal Thoughts:  No  Homicidal Thoughts:  No  Memory:  Immediate;   Good Recent;   Good Remote;   Good  Judgement:  Intact  Insight:  Present  Psychomotor Activity:  Normal  Concentration:  Concentration: Good and Attention Span: Good  Recall:  Good  Fund of Knowledge:  Good  Language:  Good  Akathisia:  No  Handed:  Right  AIMS (if indicated):     Assets:  Communication Skills Desire for Improvement Housing Social Support Talents/Skills Transportation  ADL's:  Intact  Cognition:  WNL  Sleep:  good for most nights     Assessment/Plan: MDD (major depressive disorder), recurrent episode, mild (HCC) - Plan: buPROPion (WELLBUTRIN XL) 300 MG 24 hr tablet, buPROPion (WELLBUTRIN XL) 150 MG 24 hr tablet  Anxiety - Plan: buPROPion (WELLBUTRIN XL) 300 MG 24 hr tablet, buPROPion (WELLBUTRIN XL) 150 MG 24 hr tablet  Patient doing better from the past.  She noticed when she do exercise and take the Wellbutrin 150 she sleep better and it helps.  I recommend if no side effects then she should consider taking Wellbutrin 450 a day then 300 a day and occasionally 150.  I encouraged to keep appointment with therapist and [place] [facility] specialist to establish ADHD testing.  I discussed sleep hygiene, regular exercise that also helpful.  We talk about good choices for food intake.  Encouraged breathing, exercise when she is under stress.  She does not want to take extra medicine but I recommend she can try over-the-counter melatonin to take as needed for insomnia and if that does not help we can try hydroxyzine 10 mg at bedtime if needed.  Patient agreed with the plan.  She will call us if she need the hydroxyzine.  Recommend follow-up in 3 months unless she needed an earlier  appointment.   Follow Up Instructions:     I discussed the assessment and treatment plan with the patient. The patient was provided an opportunity to ask questions and all were answered. The patient agreed with the plan and demonstrated an understanding of the instructions.   The patient was advised to call back or seek an in-person evaluation if the symptoms worsen or if the condition fails to improve as anticipated.    Collaboration of Care: Other provider involved in patient's care AEB notes are available in epic to review.  Patient/Guardian was advised Release of Information must be obtained prior to any record release in order to collaborate their care with an outside provider. Patient/Guardian was advised if they have not already done so to contact the registration department to sign all necessary forms in order for [place] to release information regarding their care.   Consent: Patient/Guardian  gives verbal consent for treatment and assignment of benefits for services provided during this visit. Patient/Guardian expressed understanding and agreed to proceed.     I provided 30 minutes of non face to face time during this encounter.  Note: This document was prepared by [employer] voice dictation technology and any errors that results from this process are unintentional.    Cleotis Nipper, MD 12/30/2022

## 2023-01-06 ENCOUNTER — Ambulatory Visit: Payer: Managed Care, Other (non HMO) | Admitting: Nurse Practitioner

## 2023-01-06 ENCOUNTER — Encounter: Payer: Self-pay | Admitting: Nurse Practitioner

## 2023-01-06 ENCOUNTER — Ambulatory Visit (INDEPENDENT_AMBULATORY_CARE_PROVIDER_SITE_OTHER): Payer: Managed Care, Other (non HMO) | Admitting: Nurse Practitioner

## 2023-01-06 VITALS — BP 110/60 | HR 68 | Temp 98.0°F | Ht 67.0 in | Wt 195.8 lb

## 2023-01-06 DIAGNOSIS — R232 Flushing: Secondary | ICD-10-CM | POA: Diagnosis not present

## 2023-01-06 DIAGNOSIS — Z23 Encounter for immunization: Secondary | ICD-10-CM | POA: Diagnosis not present

## 2023-01-06 DIAGNOSIS — Z0001 Encounter for general adult medical examination with abnormal findings: Secondary | ICD-10-CM | POA: Diagnosis not present

## 2023-01-06 DIAGNOSIS — E7800 Pure hypercholesterolemia, unspecified: Secondary | ICD-10-CM

## 2023-01-06 DIAGNOSIS — R030 Elevated blood-pressure reading, without diagnosis of hypertension: Secondary | ICD-10-CM

## 2023-01-06 DIAGNOSIS — R739 Hyperglycemia, unspecified: Secondary | ICD-10-CM

## 2023-01-06 NOTE — Assessment & Plan Note (Addendum)
Fasting lipid panel completed by [facility] on 12/10/2022:TC 160, HDL 39, LDL 110, Trig 54, VLDL 11 Glucose 89, and HgbA1c 5.2 Above numbers have improved Continue heart healthy  and daily exercise

## 2023-01-06 NOTE — Progress Notes (Signed)
Complete physical exam  Patient: Shelby Rios   DOB: 1975/08/23   47 y.o. Female  MRN: [id number] Visit Date: 01/06/2023  Subjective:    Chief Complaint  Patient presents with   Annual Exam    Fasting CPE. Pt does want flu vaccine today.   Shelby Rios is a 47 y.o. female who presents today for a complete physical exam. She reports consuming a low fat and low sodium diet.  4x/week, of cardio and/or weight training  She generally feels well. She reports sleeping well. She does have additional problems to discuss today.  Vision:Yes Dental:Yes STD Screen:No  BP Readings from Last 3 Encounters:  01/06/23 110/60  11/15/22 104/78  08/02/22 120/84   Wt Readings from Last 3 Encounters:  01/06/23 195 lb 12.8 oz (88.8 kg)  11/15/22 203 lb (92.1 kg)  08/02/22 215 lb 3.2 oz (97.6 kg)   Most recent fall risk assessment:    01/06/2023   10:16 AM  Fall Risk   Falls in the past year? 1  Number falls in past yr: 0  Injury with Fall? 1  Risk for fall due to : History of fall(s)  Follow up Falls evaluation completed   Depression screen:Yes - Depression Most recent depression screenings:    01/06/2023   10:15 AM 09/23/2022   11:03 AM  PHQ 2/9 Scores  PHQ - 2 Score 0   PHQ- 9 Score 6      Information is confidential and restricted. Go to Review Flowsheets to unlock data.   HPI  Hypercholesteremia Fasting lipid panel completed by [facility] on 12/10/2022:TC 160, HDL 39, LDL 110, Trig 54, VLDL 11 Glucose 89, and HgbA1c 5.2 Above numbers have improved Continue heart healthy  and daily exercise   Hot flashes Onset 2months ago: Intermittent hot flashes and night sweats 3-4x/week at hs. Amenorrhea due to hysterectomy, ovaries present. Check CBC, THYROID, FSH, estradiol. Prolactin  Past Medical History:  Diagnosis Date   Abnormal mammogram, unspecified    Abnormal weight gain    Allergy    Anemia    Anxiety    Back pain    Breast pain, right    Depression    Dyspepsia     Edema    Fatigue    GERD (gastroesophageal reflux disease)    H. pylori infection    Hypertension    only during pregnancy   Nonspecific abnormal results of other specified function study    Obesity    Palpitations    Routine general medical examination at a health care facility    Seizures [facility])    couple seisure around age 80- tx with dilantin for a period then d/c   Sleep apnea    c-pap wears   Vitamin B12 deficiency 11/02/2014   Past Surgical History:  Procedure Laterality Date   adenoidectomy     BRCA test     neg   DILATION AND CURETTAGE OF UTERUS     PARTIAL HYSTERECTOMY  04/22/2006   endometriosis, done vaginally, she has her ovaries   TONSILLECTOMY     WISDOM TOOTH EXTRACTION     Social History   Socioeconomic History   Marital status: Married    Spouse name: Selena Batten   Number of children: 2   Years of education: Not on file   Highest education level: Bachelor's degree (e.g., BA, AB, BS)  Occupational History   Occupation: Government social research officer: [employer]  Tobacco Use   Smoking  status: Never   Smokeless tobacco: Never   Tobacco comments:    nonsmoker  Vaping Use   Vaping status: Never Used  Substance and Sexual Activity   Alcohol use: No   Drug use: No   Sexual activity: Yes    Birth control/protection: Surgical    Comment: hysterectomy  Other Topics Concern   Not on file  Social History Narrative   Divorced since 9/08; 2 children   3 coffees/day       Patient is right-handed. She lives with her husband in a one level home. She drinks 4-6 cups of coffee a day, and 1-2 sodas a week. She participates in boot camp 3 x week and hot yoga 3 x a week, each for 1 hour.   Social Determinants of Health   Financial Resource Strain: Low Risk  (11/14/2022)   Overall Financial Resource Strain (CARDIA)    Difficulty of Paying Living Expenses: Not hard at all  Food Insecurity: No Food Insecurity (11/14/2022)   Hunger Vital Sign    Worried About Running Out  of Food in the Last Year: Never true    Ran Out of Food in the Last Year: Never true  Transportation Needs: No Transportation Needs (11/14/2022)   PRAPARE - Administrator, Civil Service (Medical): No    Lack of Transportation (Non-Medical): No  Physical Activity: Insufficiently Active (11/14/2022)   Exercise Vital Sign    Days of Exercise per Week: 2 days    Minutes of Exercise per Session: 30 min  Stress: Stress Concern Present (11/14/2022)   [employer] of Occupational Health - Occupational Stress Questionnaire    Feeling of Stress : To some extent  Social Connections: Socially Integrated (11/14/2022)   Social Connection and Isolation Panel [NHANES]    Frequency of Communication with Friends and Family: More than three times a week    Frequency of Social Gatherings with Friends and Family: Once a week    Attends Religious Services: More than 4 times per year    Active Member of [employer] or Organizations: Yes    Attends Engineer, structural: More than 4 times per year    Marital Status: Married  Catering manager Violence: Not on file   Family Status  Relation Name Status   Mother Ronnald Ramp Alive   Father Glean Salvo Deceased       CABG at 50    Sister Jeanelle Malling Deceased at age 27       complications from hypokalemia- was getting regular IV K infusions   MGM  (Not Specified)   Other  (Not Specified)   Neg Hx  (Not Specified)  No partnership data on file   Family History  Problem Relation Age of Onset   Ovarian cancer Mother    Stroke Mother 34   Diabetes Mother    Seizures Mother    Depression Mother    Hypertension Mother    Obesity Mother    Colon polyps Mother    Irritable bowel syndrome Mother    Heart disease Father        CABG age 23   Diabetes Father    Depression Father    Early death Father    Hypertension Father    Obesity Father    Hypokalemia Sister        deceased at 10; was getting regular IV K infusions; long Hx of  pseudotumor cerebri   Early death Sister    Breast cancer Maternal  Grandmother    Heart attack Other        GF - 35   Coronary artery disease Other        family hx   Hypertension Other        family hx   Diabetes Other        family hx   Colon cancer Neg Hx    Esophageal cancer Neg Hx    Pancreatic cancer Neg Hx    Rectal cancer Neg Hx    Stomach cancer Neg Hx    No Known Allergies  Patient Care Team: Makylie Rivere, Bonna Gains, NP as PCP - General (Internal Medicine) Drema Dallas, DO as Consulting Physician (Neurology)   Medications: Outpatient Medications Prior to Visit  Medication Sig   acyclovir (ZOVIRAX) 800 MG tablet Take 800 mg by mouth as needed.   buPROPion (WELLBUTRIN XL) 150 MG 24 hr tablet Take 1 tablet (150 mg total) by mouth every morning.   buPROPion (WELLBUTRIN XL) 300 MG 24 hr tablet Take 1 tablet (300 mg total) by mouth daily.   cholecalciferol (VITAMIN D3) 25 MCG (1000 UNIT) tablet Take 1,000 Units by mouth daily.   ibuprofen (ADVIL) 200 MG tablet Take 200 mg by mouth as needed.   loratadine (CLARITIN) 10 MG tablet Take 10 mg by mouth daily.   losartan (COZAAR) 50 MG tablet Take 1 tablet (50 mg total) by mouth in the morning.   multivitamin-iron-minerals-folic acid (CENTRUM) chewable tablet Chew 1 tablet by mouth daily.   pantoprazole (PROTONIX) 20 MG tablet TAKE 1 TABLET BY MOUTH DAILY AS  NEEDED   Semaglutide-Weight Management (WEGOVY) 2.4 MG/0.75ML SOAJ Inject 2.4 mg into the skin. Provided by [facility]   vitamin B-12 (CYANOCOBALAMIN) 100 MCG tablet Take 100 mcg by mouth daily.   No facility-administered medications prior to visit.    Review of Systems      Objective:  BP 110/60   Pulse 68   Temp 98 F (36.7 C) (Temporal)   Ht 5\' 7"  (1.702 m)   Wt 195 lb 12.8 oz (88.8 kg)   LMP 07/22/2006   SpO2 98%   BMI 30.67 kg/m     Physical Exam Vitals and nursing note reviewed.  Constitutional:      General: She is not in acute  distress. HENT:     Right Ear: Tympanic membrane, ear canal and external ear normal.     Left Ear: Tympanic membrane, ear canal and external ear normal.     Nose: Nose normal.  Eyes:     Extraocular Movements: Extraocular movements intact.     Conjunctiva/sclera: Conjunctivae normal.     Pupils: Pupils are equal, round, and reactive to light.  Neck:     Thyroid: No thyroid mass, thyromegaly or thyroid tenderness.  Cardiovascular:     Rate and Rhythm: Normal rate and regular rhythm.     Pulses: Normal pulses.     Heart sounds: Normal heart sounds.  Pulmonary:     Effort: Pulmonary effort is normal.     Breath sounds: Normal breath sounds.  Abdominal:     General: Bowel sounds are normal.     Palpations: Abdomen is soft.  Musculoskeletal:        General: Normal range of motion.     Cervical back: Normal range of motion and neck supple.     Right lower leg: No edema.     Left lower leg: No edema.  Lymphadenopathy:     Cervical: No  cervical adenopathy.  Skin:    General: Skin is warm and dry.  Neurological:     Mental Status: She is alert and oriented to person, place, and time.     Cranial Nerves: No cranial nerve deficit.  Psychiatric:        Mood and Affect: Mood normal.        Behavior: Behavior normal.        Thought Content: Thought content normal.      No results found for any visits on 01/06/23.    Assessment & Plan:    Routine Health Maintenance and Physical Exam  Immunization History  Administered Date(s) Administered   COVID-19, mRNA, vaccine(Comirnaty)12 years and older 03/20/2022   Hepatitis B, ADULT 09/17/2013, 10/15/2013, 03/14/2014   Influenza Whole 02/05/2010   Influenza, Seasonal, Injecte, Preservative Fre 01/06/2023   Influenza,inj,Quad PF,6+ Mos 03/04/2013, 02/10/2014, 01/12/2015, 01/04/2016, 01/13/2018, 12/21/2018, 01/01/2021, 01/03/2022   Influenza-Unspecified 01/12/2020   Moderna Sars-Covid-2 Vaccination 05/28/2019, 06/25/2019   PPD Test  09/17/2013, 10/15/2013   Pfizer Covid-19 Vaccine Bivalent Booster 86yrs & up 03/20/2022   Tdap 11/12/2017, 03/20/2022    Health Maintenance  Topic Date Due   COVID-19 Vaccine (3 - 2023-24 season) 12/22/2022   Hepatitis C Screening  01/06/2024 (Originally 09/23/1993)   Colonoscopy  03/10/2031   DTaP/Tdap/Td (3 - Td or Tdap) 03/20/2032   INFLUENZA VACCINE  Completed   HIV Screening  Completed   HPV VACCINES  Aged Out    Discussed health benefits of physical activity, and encouraged her to engage in regular exercise appropriate for her age and condition.  Problem List Items Addressed This Visit     Hot flashes    Onset 2months ago: Intermittent hot flashes and night sweats 3-4x/week at hs. Amenorrhea due to hysterectomy, ovaries present. Check CBC, THYROID, FSH, estradiol. Prolactin      Relevant Orders   FSH   Prolactin   Estradiol   TSH   CBC with Differential/Platelet   Hypercholesteremia    Fasting lipid panel completed by [facility] on 12/10/2022:TC 160, HDL 39, LDL 110, Trig 54, VLDL 11 Glucose 89, and HgbA1c 5.2 Above numbers have improved Continue heart healthy  and daily exercise       Other Visit Diagnoses     Encounter for preventative adult health care exam with abnormal findings    -  Primary   Need for influenza vaccination       Relevant Orders   Flu vaccine trivalent PF, 6mos and older(Flulaval,Afluria,Fluarix,Fluzone) (Completed)      Return in about 1 year (around 01/06/2024) for CPE (fasting).     Alysia Penna, NP

## 2023-01-06 NOTE — Assessment & Plan Note (Signed)
Onset 2months ago: Intermittent hot flashes and night sweats 3-4x/week at hs. Amenorrhea due to hysterectomy, ovaries present. Check CBC, THYROID, FSH, estradiol. Prolactin

## 2023-01-06 NOTE — Patient Instructions (Addendum)
Continue Heart healthy diet and daily exercise. Maintain current medications. Schedule appointment for mammogram Ok to use estroven OVER THE COUNTER for hot flashes. Go to lab  Preventive Care 49-47 Years Old, Female Preventive care refers to lifestyle choices and visits with your health care provider that can promote health and wellness. Preventive care visits are also called wellness exams. What can I expect for my preventive care visit? Counseling Your health care provider may ask you questions about your: Medical history, including: Past medical problems. Family medical history. Pregnancy history. Current health, including: Menstrual cycle. Method of birth control. Emotional well-being. Home life and relationship well-being. Sexual activity and sexual health. Lifestyle, including: Alcohol, nicotine or tobacco, and drug use. Access to firearms. Diet, exercise, and sleep habits. Work and work Astronomer. Sunscreen use. Safety issues such as seatbelt and bike helmet use. Physical exam Your health care provider will check your: Height and weight. These may be used to calculate your BMI (body mass index). BMI is a measurement that tells if you are at a healthy weight. Waist circumference. This measures the distance around your waistline. This measurement also tells if you are at a healthy weight and may help predict your risk of certain diseases, such as type 2 diabetes and high blood pressure. Heart rate and blood pressure. Body temperature. Skin for abnormal spots. What immunizations do I need?  Vaccines are usually given at various ages, according to a schedule. Your health care provider will recommend vaccines for you based on your age, medical history, and lifestyle or other factors, such as travel or where you work. What tests do I need? Screening Your health care provider may recommend screening tests for certain conditions. This may include: Lipid and cholesterol  levels. Diabetes screening. This is done by checking your blood sugar (glucose) after you have not eaten for a while (fasting). Pelvic exam and Pap test. Hepatitis B test. Hepatitis C test. HIV (human immunodeficiency virus) test. STI (sexually transmitted infection) testing, if you are at risk. Lung cancer screening. Colorectal cancer screening. Mammogram. Talk with your health care provider about when you should start having regular mammograms. This may depend on whether you have a family history of breast cancer. BRCA-related cancer screening. This may be done if you have a family history of breast, ovarian, tubal, or peritoneal cancers. Bone density scan. This is done to screen for osteoporosis. Talk with your health care provider about your test results, treatment options, and if necessary, the need for more tests. Follow these instructions at home: Eating and drinking  Eat a diet that includes fresh fruits and vegetables, whole grains, lean protein, and low-fat dairy products. Take vitamin and mineral supplements as recommended by your health care provider. Do not drink alcohol if: Your health care provider tells you not to drink. You are pregnant, may be pregnant, or are planning to become pregnant. If you drink alcohol: Limit how much you have to 0-1 drink a day. Know how much alcohol is in your drink. In the [place] one drink equals one 12 oz bottle of beer (355 mL), one 5 oz glass of wine (148 mL), or one 1 oz glass of hard liquor (44 mL). Lifestyle Brush your teeth every morning and night with fluoride toothpaste. Floss one time each day. Exercise for at least 30 minutes 5 or more days each week. Do not use any products that contain nicotine or tobacco. These products include cigarettes, chewing tobacco, and vaping devices, such as e-cigarettes. If you need  help quitting, ask your health care provider. Do not use drugs. If you are sexually active, practice safe sex. Use a  condom or other form of protection to prevent STIs. If you do not wish to become pregnant, use a form of birth control. If you plan to become pregnant, see your health care provider for a prepregnancy visit. Take aspirin only as told by your health care provider. Make sure that you understand how much to take and what form to take. Work with your health care provider to find out whether it is safe and beneficial for you to take aspirin daily. Find healthy ways to manage stress, such as: Meditation, yoga, or listening to music. Journaling. Talking to a trusted person. Spending time with friends and family. Minimize exposure to UV radiation to reduce your risk of skin cancer. Safety Always wear your seat belt while driving or riding in a vehicle. Do not drive: If you have been drinking alcohol. Do not ride with someone who has been drinking. When you are tired or distracted. While texting. If you have been using any mind-altering substances or drugs. Wear a helmet and other protective equipment during sports activities. If you have firearms in your house, make sure you follow all gun safety procedures. Seek help if you have been physically or sexually abused. What's next? Visit your health care provider once a year for an annual wellness visit. Ask your health care provider how often you should have your eyes and teeth checked. Stay up to date on all vaccines. This information is not intended to replace advice given to you by your health care provider. Make sure you discuss any questions you have with your health care provider. Document Revised: 10/04/2020 Document Reviewed: 10/04/2020 Elsevier Patient Education  2024 [employer].

## 2023-01-07 LAB — CBC WITH DIFFERENTIAL/PLATELET
Basophils Absolute: 0 10*3/uL (ref 0.0–0.2)
Basos: 1 %
EOS (ABSOLUTE): 0.1 10*3/uL (ref 0.0–0.4)
Eos: 2 %
Hematocrit: 41.6 % (ref 34.0–46.6)
Hemoglobin: 13 g/dL (ref 11.1–15.9)
Immature Grans (Abs): 0 10*3/uL (ref 0.0–0.1)
Immature Granulocytes: 0 %
Lymphocytes Absolute: 1.5 10*3/uL (ref 0.7–3.1)
Lymphs: 30 %
MCH: 28.6 pg (ref 26.6–33.0)
MCHC: 31.3 g/dL — ABNORMAL LOW (ref 31.5–35.7)
MCV: 91 fL (ref 79–97)
Monocytes Absolute: 0.6 10*3/uL (ref 0.1–0.9)
Monocytes: 11 %
Neutrophils Absolute: 2.9 10*3/uL (ref 1.4–7.0)
Neutrophils: 56 %
Platelets: 299 10*3/uL (ref 150–450)
RBC: 4.55 x10E6/uL (ref 3.77–5.28)
RDW: 12.5 % (ref 11.7–15.4)
WBC: 5.1 10*3/uL (ref 3.4–10.8)

## 2023-01-07 LAB — FOLLICLE STIMULATING HORMONE: FSH: 6.7 m[IU]/mL

## 2023-01-07 LAB — ESTRADIOL: Estradiol: 230 pg/mL

## 2023-01-07 LAB — TSH: TSH: 1.16 u[IU]/mL (ref 0.450–4.500)

## 2023-01-07 LAB — PROLACTIN: Prolactin: 14.6 ng/mL (ref 4.8–33.4)

## 2023-01-13 ENCOUNTER — Other Ambulatory Visit: Payer: Self-pay | Admitting: Nurse Practitioner

## 2023-01-13 DIAGNOSIS — Z1231 Encounter for screening mammogram for malignant neoplasm of breast: Secondary | ICD-10-CM

## 2023-02-08 ENCOUNTER — Encounter: Payer: Self-pay | Admitting: Cardiology

## 2023-02-08 DIAGNOSIS — R000 Tachycardia, unspecified: Secondary | ICD-10-CM

## 2023-02-08 DIAGNOSIS — R002 Palpitations: Secondary | ICD-10-CM

## 2023-02-13 ENCOUNTER — Ambulatory Visit
Admission: RE | Admit: 2023-02-13 | Discharge: 2023-02-13 | Disposition: A | Discharge status: Home / Self Care | Payer: Managed Care, Other (non HMO) | Source: Ambulatory Visit | Attending: Nurse Practitioner | Admitting: Nurse Practitioner

## 2023-02-13 DIAGNOSIS — Z1231 Encounter for screening mammogram for malignant neoplasm of breast: Secondary | ICD-10-CM

## 2023-02-15 ENCOUNTER — Encounter (HOSPITAL_COMMUNITY): Payer: Self-pay

## 2023-02-15 ENCOUNTER — Other Ambulatory Visit (HOSPITAL_COMMUNITY): Payer: Self-pay | Admitting: Psychiatry

## 2023-02-15 DIAGNOSIS — F330 Major depressive disorder, recurrent, mild: Secondary | ICD-10-CM

## 2023-02-15 DIAGNOSIS — F419 Anxiety disorder, unspecified: Secondary | ICD-10-CM

## 2023-02-17 ENCOUNTER — Other Ambulatory Visit: Payer: Self-pay | Admitting: Nurse Practitioner

## 2023-02-17 ENCOUNTER — Telehealth: Payer: Self-pay | Admitting: Nurse Practitioner

## 2023-02-17 DIAGNOSIS — R928 Other abnormal and inconclusive findings on diagnostic imaging of breast: Secondary | ICD-10-CM

## 2023-02-17 NOTE — Telephone Encounter (Signed)
Pt stated she received the results form her mammogram and there was some abnormal findings. She would like a call back to discuss the results and what comes next.  Alysen [phone]

## 2023-02-20 NOTE — Telephone Encounter (Signed)
Sure, you can under palpitations / tachycardia. Non live. 2 week.   Thanks   Moore, DO, [facility]

## 2023-02-24 ENCOUNTER — Ambulatory Visit: Payer: Managed Care, Other (non HMO) | Attending: Cardiology

## 2023-02-24 DIAGNOSIS — R000 Tachycardia, unspecified: Secondary | ICD-10-CM

## 2023-02-24 DIAGNOSIS — R002 Palpitations: Secondary | ICD-10-CM

## 2023-02-24 NOTE — Progress Notes (Unsigned)
Enrolled for [employer] to mail a ZIO XT long term holter monitor to the patients address on file. Request to be shipped 03/03/23.

## 2023-02-24 NOTE — Telephone Encounter (Signed)
Answered her questions about need for repeat mammogram and [place]. She is scheduled to have these images on 03/04/23

## 2023-02-24 NOTE — Addendum Note (Signed)
Addended by: Dossie Arbour on: 02/24/2023 03:54 PM   Modules accepted: Orders

## 2023-03-03 NOTE — Telephone Encounter (Signed)
I reviewed patient's pharmacologic attention specialist which was done on November 1 by Marisue Brooklyn.  Did not meet criteria for ADHD at this time due to significant comorbid grief.  As per evaluation results are borderline for ADHD but sometimes really did not start to become problematic until childhood when she was struggling with family losses and grief. Recommended grief therapy to alleviate the symptoms.  Patient has appointment coming up on March 31, 2023.

## 2023-03-04 ENCOUNTER — Ambulatory Visit
Admission: RE | Admit: 2023-03-04 | Discharge: 2023-03-04 | Disposition: A | Discharge status: Home / Self Care | Payer: Managed Care, Other (non HMO) | Source: Ambulatory Visit | Attending: Nurse Practitioner

## 2023-03-04 DIAGNOSIS — R928 Other abnormal and inconclusive findings on diagnostic imaging of breast: Secondary | ICD-10-CM

## 2023-03-05 DIAGNOSIS — R002 Palpitations: Secondary | ICD-10-CM

## 2023-03-05 DIAGNOSIS — R000 Tachycardia, unspecified: Secondary | ICD-10-CM | POA: Diagnosis not present

## 2023-03-31 ENCOUNTER — Encounter (HOSPITAL_COMMUNITY): Payer: Self-pay | Admitting: Psychiatry

## 2023-03-31 ENCOUNTER — Telehealth (HOSPITAL_BASED_OUTPATIENT_CLINIC_OR_DEPARTMENT_OTHER): Payer: 59 | Admitting: Psychiatry

## 2023-03-31 DIAGNOSIS — F419 Anxiety disorder, unspecified: Secondary | ICD-10-CM | POA: Diagnosis not present

## 2023-03-31 DIAGNOSIS — F330 Major depressive disorder, recurrent, mild: Secondary | ICD-10-CM

## 2023-03-31 MED ORDER — BUPROPION HCL ER (XL) 300 MG PO TB24: 300.0000 mg | ORAL_TABLET | Freq: Every day | ORAL | 0 refills | Status: DC

## 2023-03-31 NOTE — Progress Notes (Signed)
[facility] MD Virtual Progress Note   Patient Location: Home Provider Location: Home Office  I connect with patient by video and verified that I am speaking with correct person by using two identifiers. I discussed the limitations of evaluation and management by telemedicine and the availability of in person appointments. I also discussed with the patient that there may be a patient responsible charge related to this service. The patient expressed understanding and agreed to proceed.  Shelby Rios [id number] 47 y.o.  03/31/2023 10:03 AM  History of Present Illness:  Patient is evaluated by video session.  She reported continued to struggle with attention and focus but sleeping better.  She tried Wellbutrin 450 however has not seen significant improvement.  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  Patient told she had testing done at [facility] on the November 1.  Patient told that it was not conclusive for ADHD.  Patient was told the first 10 minutes she was not fidgety but towards the end of the session she was found some restlessness and appears fidgety when question was asked about the grief.  She is now giving more attention and focus to her therapy at [facility].  She is seeing Randa Evens and she feel it has been helping.  She also focus on her general health and able to lost 5 pounds.  Patient works at [employer].  She tried to keep herself busy.  She has a daughter who is at [employer] as a junior, she also taking care of 57-year-old grandbaby.  She denies any crying spells or any feeling of hopelessness or worthlessness.  She denies any major panic attack or suicidal thoughts.  Her biggest concern is some time struggle with focus and attention.  Recently father-in-law passed away and she stays home and spend time with the husband's family on the Thanksgiving.  She had blood work including TSH which was normal.  Past Psychiatric  History: History of depression and anxiety.  Took Paxil, Prozac, Zoloft, Effexor, BuSpar and Wellbutrin.  Medicines are prescribed from PCP.  No history of suicidal attempt, inpatient treatment or mania.  Wellbutrin 450 did not work.   Outpatient Encounter Medications as of 03/31/2023  Medication Sig   acyclovir (ZOVIRAX) 800 MG tablet Take 800 mg by mouth as needed.   buPROPion (WELLBUTRIN XL) 150 MG 24 hr tablet Take 1 tablet (150 mg total) by mouth every morning.   buPROPion (WELLBUTRIN XL) 300 MG 24 hr tablet Take 1 tablet (300 mg total) by mouth daily.   cholecalciferol (VITAMIN D3) 25 MCG (1000 UNIT) tablet Take 1,000 Units by mouth daily.   ibuprofen (ADVIL) 200 MG tablet Take 200 mg by mouth as needed.   loratadine (CLARITIN) 10 MG tablet Take 10 mg by mouth daily.   losartan (COZAAR) 50 MG tablet Take 1 tablet (50 mg total) by mouth in the morning.   multivitamin-iron-minerals-folic acid (CENTRUM) chewable tablet Chew 1 tablet by mouth daily.   pantoprazole (PROTONIX) 20 MG tablet TAKE 1 TABLET BY MOUTH DAILY AS  NEEDED   Semaglutide-Weight Management (WEGOVY) 2.4 MG/0.75ML SOAJ Inject 2.4 mg into the skin. Provided by [facility]   vitamin B-12 (CYANOCOBALAMIN) 100 MCG tablet Take 100 mcg by mouth daily.   No facility-administered encounter medications on file as of 03/31/2023.    Recent Results (from the past 2160 hour(s))  FSH     Status: None   Collection Time: 01/06/23 11:09 AM  Result Value Ref Range   FSH 6.7 mIU/mL    Comment:                      Adult Female             Range                       Follicular phase      3.5 -  12.5                       Ovulation phase       4.7 -  21.5                       Luteal phase          1.7 -   7.7                       Postmenopausal       25.8 - 134.8   Prolactin     Status: None   Collection Time: 01/06/23 11:09 AM  Result Value Ref Range   Prolactin 14.6 4.8 - 33.4 ng/mL  Estradiol     Status: None    Collection Time: 01/06/23 11:09 AM  Result Value Ref Range   Estradiol 230.0 pg/mL    Comment:                      Adult Female             Range                       Follicular phase     12.5 - 166.0                       Ovulation phase      85.8 - 498.0                       Luteal phase         43.8 - 211.0                       Postmenopausal       <6.0 -  54.7                      Pregnancy                       1st trimester     215.0 - >4300.0 Roche ECLIA methodology   TSH     Status: None   Collection Time: 01/06/23 11:09 AM  Result Value Ref Range   TSH 1.160 0.450 - 4.500 uIU/mL  CBC with Differential/Platelet     Status: Abnormal   Collection Time: 01/06/23 11:09 AM  Result Value Ref Range   WBC 5.1 3.4 - 10.8 x10E3/uL   RBC 4.55 3.77 - 5.28 x10E6/uL   Hemoglobin 13.0 11.1 - 15.9 g/dL   Hematocrit 16.1 09.6 - 46.6 %   MCV 91 79 - 97 fL   MCH 28.6 26.6 - 33.0 pg   MCHC 31.3 (L) 31.5 - 35.7 g/dL   RDW 04.5 40.9 - 81.1 %   Platelets 299 150 - 450 x10E3/uL   Neutrophils 56 Not Estab. %  Lymphs 30 Not Estab. %   Monocytes 11 Not Estab. %   Eos 2 Not Estab. %   Basos 1 Not Estab. %   Neutrophils Absolute 2.9 1.4 - 7.0 x10E3/uL   Lymphocytes Absolute 1.5 0.7 - 3.1 x10E3/uL   Monocytes Absolute 0.6 0.1 - 0.9 x10E3/uL   EOS (ABSOLUTE) 0.1 0.0 - 0.4 x10E3/uL   Basophils Absolute 0.0 0.0 - 0.2 x10E3/uL   Immature Granulocytes 0 Not Estab. %   Immature Grans (Abs) 0.0 0.0 - 0.1 x10E3/uL     Psychiatric Specialty Exam: Physical Exam  Review of Systems  Weight 182 lb (82.6 kg), last menstrual period 07/22/2006.There is no height or weight on file to calculate BMI.  General Appearance: Casual  Eye Contact:  Good  Speech:  Clear and Coherent and Normal Rate  Volume:  Normal  Mood:  Euthymic  Affect:  Appropriate  Thought Process:  Goal Directed  Orientation:  Full (Time, Place, and Person)  Thought Content:  Logical  Suicidal Thoughts:  No  Homicidal Thoughts:   No  Memory:  Immediate;   Good Recent;   Good Remote;   Good  Judgement:  Intact  Insight:  Present  Psychomotor Activity:  Normal  Concentration:  Concentration: Good and Attention Span: Good  Recall:  Good  Fund of Knowledge:  Good  Language:  Good  Akathisia:  No  Handed:  Right  AIMS (if indicated):     Assets:  Communication Skills Desire for Improvement Housing Resilience Social Support Talents/Skills Transportation  ADL's:  Intact  Cognition:  WNL  Sleep:  good, at least 8 hrs     Assessment/Plan: Anxiety - Plan: buPROPion (WELLBUTRIN XL) 300 MG 24 hr tablet  MDD (major depressive disorder), recurrent episode, mild (HCC) - Plan: buPROPion (WELLBUTRIN XL) 300 MG 24 hr tablet  Discussed blood work results.  TSH and CBC normal.  I had a discussion about attention, focus and multitasking.  Patient had testing but she was told no ADHD symptoms.  I asked patient to have her assessment faxed to [place] so we can look into it.  I also recommend should consider getting vitamin D, hormones level, B12 level if they are normal.  I also recommend should cut down back to Wellbutrin 300 since 450 did not help.  She is trying to focus on her physical health, regular exercise, sleep hygiene and like to continue therapy since she noticed improvement overall.  Encourage to keep using these resources and for now no new medication added.  Patient told she would like to see after the therapy sessions are or if overall she feels improved.  If not then we will consider medication adjustment.  Patient agreed with the plan.  We will follow-up in 3 months.  A new prescription of Wellbutrin XL 300 mg send to the pharmacy.   Follow Up Instructions:     I discussed the assessment and treatment plan with the patient. The patient was provided an opportunity to ask questions and all were answered. The patient agreed with the plan and demonstrated an understanding of the instructions.   The patient was  advised to call back or seek an in-person evaluation if the symptoms worsen or if the condition fails to improve as anticipated.    Collaboration of Care: Other provider involved in patient's care AEB notes are available in epic to review  Patient/Guardian was advised Release of Information must be obtained prior to any record release in order to collaborate their care  with an outside provider. Patient/Guardian was advised if they have not already done so to contact the registration department to sign all necessary forms in order for [place] to release information regarding their care.   Consent: Patient/Guardian gives verbal consent for treatment and assignment of benefits for services provided during this visit. Patient/Guardian expressed understanding and agreed to proceed.     I provided 25 minutes of non face to face time during this encounter.  Note: This document was prepared by [employer] voice dictation technology and any errors that results from this process are unintentional.    Cleotis Nipper, MD 03/31/2023

## 2023-06-05 ENCOUNTER — Ambulatory Visit: Payer: Self-pay | Admitting: Cardiology

## 2023-06-10 ENCOUNTER — Encounter (HOSPITAL_COMMUNITY): Payer: Self-pay

## 2023-06-10 NOTE — Telephone Encounter (Signed)
 Have you seen any results? If not I will call [facility].

## 2023-06-11 ENCOUNTER — Telehealth (HOSPITAL_COMMUNITY): Payer: Self-pay | Admitting: *Deleted

## 2023-06-11 NOTE — Telephone Encounter (Signed)
 Test results received via fax from [facility]. Copy made for scan center and one in your box. LVM for pt.

## 2023-06-11 NOTE — Telephone Encounter (Signed)
 Have left VM for nurse.

## 2023-06-23 ENCOUNTER — Encounter: Payer: Self-pay | Admitting: Cardiology

## 2023-06-23 ENCOUNTER — Ambulatory Visit: Payer: Managed Care, Other (non HMO) | Attending: Cardiology | Admitting: Cardiology

## 2023-06-23 VITALS — BP 120/84 | HR 81 | Resp 16 | Ht 67.0 in | Wt 182.2 lb

## 2023-06-23 DIAGNOSIS — I10 Essential (primary) hypertension: Secondary | ICD-10-CM | POA: Diagnosis not present

## 2023-06-23 DIAGNOSIS — R002 Palpitations: Secondary | ICD-10-CM | POA: Diagnosis not present

## 2023-06-23 DIAGNOSIS — G4733 Obstructive sleep apnea (adult) (pediatric): Secondary | ICD-10-CM | POA: Diagnosis not present

## 2023-06-23 DIAGNOSIS — R0609 Other forms of dyspnea: Secondary | ICD-10-CM

## 2023-06-23 DIAGNOSIS — Z8249 Family history of ischemic heart disease and other diseases of the circulatory system: Secondary | ICD-10-CM

## 2023-06-23 NOTE — Patient Instructions (Signed)
 Medication Instructions:  Your physician recommends that you continue on your current medications as directed. Please refer to the Current Medication list given to you today.  *If you need a refill on your cardiac medications before your next appointment, please call your pharmacy*  Lab Work: None ordered today. If you have labs (blood work) drawn today and your tests are completely normal, you will receive your results only by: MyChart Message (if you have MyChart) OR A paper copy in the mail If you have any lab test that is abnormal or we need to change your treatment, we will call you to review the results.  Testing/Procedures: None ordered today.  Follow-Up: At [facility], you and your health needs are our priority.  As part of our continuing mission to provide you with exceptional heart care, we have created designated Provider Care Teams.  These Care Teams include your primary Cardiologist (physician) and Advanced Practice Providers (APPs -  Physician Assistants and Nurse Practitioners) who all work together to provide you with the care you need, when you need it.  We recommend signing up for the patient portal called "MyChart".  Sign up information is provided on this After Visit Summary.  MyChart is used to connect with patients for Virtual Visits (Telemedicine).  Patients are able to view lab/test results, encounter notes, upcoming appointments, etc.  Non-urgent messages can be sent to your provider as well.   To learn more about what you can do with MyChart, go to [email or web address].    Your next appointment:   1 year(s)  The format for your next appointment:   In Person  Provider:   Tessa Lerner, DO {

## 2023-06-23 NOTE — Progress Notes (Signed)
 Cardiology Office Note:  .   Date:  06/23/2023  ID:  Shelby Rios, DOB 09-Apr-1976, MRN [id number] PCP:  Anne Ng, NP  Former Cardiology Providers: Dr. Julien Nordmann  [facility] Providers Cardiologist:  Tessa Lerner, DO , [facility] (established care 01/21/2022 ) Electrophysiologist:  None  Click to update primary MD,subspecialty MD or APP then REFRESH:1}    Chief Complaint  Patient presents with   Dyspnea on exertion   Follow-up    History of Present Illness: .   Shelby Rios is a 48 y.o. Caucasian female whose past medical history and cardiovascular risk factors includes: Family history of premature CAD, hypertension, OSA, obesity.   Patient was referred to the office back in 2023 given her family history of CAD.  She has undergone appropriate cardiovascular workup including echocardiogram, GXT, coronary calcium score.  We discussed primary prevention for CAD and improving her modifiable vascular risk factors.  Patient's blood pressures were not ideal initially but with up titration of antihypertensive medications her blood pressure today is well-controlled.  At the last office visit losartan was increased from 25 mg p.o. daily to 50 mg p.o. daily.  Since last office visit patient also had a cardiac monitor for palpitations which was overall unremarkable.  Clinically doing well from a cardiovascular standpoint.  She is lost 28 pounds due to combination of increasing physical activity, seeing nutritionist at [employer], and also [place].  Father had CABG at the age of 46 and other confounding factors included smoking, diabetes, PAD. He died at the age of 37 due to complications of sepsis. Mother had CVA around the age of 55 with no significant residual deficits according to the patient. Sister died at the age of 65 likely secondary to potassium hemostasis as she would require frequent treatments for Chiari malformation. And paternal side has significant amount of CAD at a  young age (less than 55 years of age).   Review of Systems: .   Review of Systems  Constitutional: Positive for weight loss (28#).  Cardiovascular:  Negative for chest pain, claudication, irregular heartbeat, leg swelling, near-syncope, orthopnea, palpitations, paroxysmal nocturnal dyspnea and syncope.  Respiratory:  Negative for shortness of breath.   Hematologic/Lymphatic: Negative for bleeding problem.    Studies Reviewed:   EKG: EKG Interpretation Date/Time:  Monday June 23 2023 08:10:16 EST Ventricular Rate:  81 PR Interval:  124 QRS Duration:  96 QT Interval:  358 QTC Calculation: 415 R Axis:   30  Text Interpretation: Normal sinus rhythm Incomplete right bundle branch block No previous ECGs available Confirmed by Tessa Lerner [phone]) on 06/23/2023 8:17:46 AM  Echocardiogram: 01/23/2022: Normal LV systolic function with visual EF 60-65%. Left ventricle cavity is normal in size. Normal left ventricular wall thickness. Normal global wall motion. Normal diastolic filling pattern, normal LAP.  Mild tricuspid regurgitation. No evidence of pulmonary hypertension. No prior study for comparison.      Stress Testing: Exercise treadmill stress test 01/31/2022: Exercise treadmill stress test performed using Bruce protocol.  Patient reached 8.3 METS, and 98% of age predicted maximum heart rate.  Exercise capacity was fair.  No chest pain reported.  Normal heart rate and hemodynamic response. Stress EKG revealed no ischemic changes. Low risk study.    Coronary calcium score 02/02/2022: No visible coronary artery calcifications. Total coronary calcium score of 0. No acute or significant extracardiac abnormality.   Cardiac monitor (Zio Patch): 03/05/2023-03/19/2023 Dominant rhythm sinus rhythm. Heart rate 58-157 bpm. Avg HR 81 bpm.  No atrial fibrillation detected during the monitoring period. No ventricular tachycardia, high grade AV block, pauses (3 seconds or longer). Total  supraventricular ectopic burden <1%. Total ventricular ectopic burden <1%. Patient triggered events: 3. Underlying rhythm is sinus without ectopy.   RADIOLOGY: NA  Risk Assessment/Calculations:   N/A   Labs:       Latest Ref Rng & Units 01/06/2023   11:09 AM 07/20/2021    2:54 PM 07/16/2021    2:03 PM  CBC  WBC 3.4 - 10.8 x10E3/uL 5.1  9.6  CANCELED   Hemoglobin 11.1 - 15.9 g/dL 16.1  09.6    Hematocrit 34.0 - 46.6 % 41.6  39.4    Platelets 150 - 450 x10E3/uL 299  332         Latest Ref Rng & Units 08/27/2022    9:09 AM 01/03/2022    9:45 AM 07/16/2021    2:03 PM  BMP  Glucose 70 - 99 mg/dL 88  045  409   BUN 6 - 24 mg/dL 11  10  5    Creatinine 0.57 - 1.00 mg/dL 8.11  9.14  7.82   BUN/Creat Ratio 9 - 23 14  13  6    Sodium 134 - 144 mmol/L 137  138  137   Potassium 3.5 - 5.2 mmol/L 4.8  4.0  4.1   Chloride 96 - 106 mmol/L 100  101  98   CO2 20 - 29 mmol/L 24  21  24    Calcium 8.7 - 10.2 mg/dL 9.0  9.0  9.1       Latest Ref Rng & Units 08/27/2022    9:09 AM 01/03/2022    9:45 AM 07/16/2021    2:03 PM  CMP  Glucose 70 - 99 mg/dL 88  956  213   BUN 6 - 24 mg/dL 11  10  5    Creatinine 0.57 - 1.00 mg/dL 0.86  5.78  4.69   Sodium 134 - 144 mmol/L 137  138  137   Potassium 3.5 - 5.2 mmol/L 4.8  4.0  4.1   Chloride 96 - 106 mmol/L 100  101  98   CO2 20 - 29 mmol/L 24  21  24    Calcium 8.7 - 10.2 mg/dL 9.0  9.0  9.1   Total Protein 6.0 - 8.5 g/dL  7.1    Total Bilirubin 0.0 - 1.2 mg/dL  0.3    Alkaline Phos 44 - 121 IU/L  91    AST 0 - 40 IU/L  17    ALT 0 - 32 IU/L  26      Lab Results  Component Value Date   CHOL 183 08/27/2022   HDL 52 08/27/2022   LDLCALC 118 (H) 08/27/2022   TRIG 69 08/27/2022   CHOLHDL 3.5 01/03/2022   No results for input(s): "LIPOA" in the last 8760 hours. No components found for: "NTPROBNP" No results for input(s): "PROBNP" in the last 8760 hours. Recent Labs    01/06/23 1109  TSH 1.160    Physical Exam:    Today's Vitals    06/23/23 0807  BP: 120/84  Pulse: 81  Resp: 16  SpO2: 98%  Weight: 182 lb 3.2 oz (82.6 kg)  Height: 5\' 7"  (1.702 m)   Body mass index is 28.54 kg/m. Wt Readings from Last 3 Encounters:  06/23/23 182 lb 3.2 oz (82.6 kg)  01/06/23 195 lb 12.8 oz (88.8 kg)  11/15/22 203 lb (92.1 kg)    Physical  Exam  Constitutional: No distress.  hemodynamically stable  Neck: No JVD present.  Cardiovascular: Normal rate, regular rhythm, S1 normal and S2 normal. Exam reveals no gallop, no S3, no S4 and no friction rub.  No murmur heard. Pulmonary/Chest: Effort normal and breath sounds normal. No stridor. She has no wheezes. She has no rales. She exhibits no tenderness.  Abdominal: Soft. Bowel sounds are normal. She exhibits no distension. There is no abdominal tenderness.  Musculoskeletal:        General: No tenderness or edema.     Cervical back: Neck supple.  Neurological: She is alert and oriented to person, place, and time.  Skin: Skin is warm and dry.     Impression & Recommendation(s):  Impression:   ICD-10-CM   1. Dyspnea on exertion  R06.09 EKG 12-Lead    2. Palpitations  R00.2     3. Benign hypertension  I10     4. OSA on CPAP  G47.33     5. Family history of premature CAD  Z82.49        Recommendation(s):  Dyspnea on exertion Improved Has lost 28# since the last visit.  Exercises regularly-treadmill or walking outside at least 4 days a week and takes [facility].  EKG today looks normal.  Echo: Preserved LVEF, normal diastolic function, no significant valvular heart disease. GXT: Low risk. Total CAC 0  Palpitations Improved  Zio results reviewed today.   Benign hypertension Office and home blood pressures are very well-controlled. Continue losartan 50 mg p.o. every morning. Reemphasized importance of low-salt diet.  OSA on CPAP Reemphasized importance of device compliance.  Family history of premature CAD See HPI    Orders Placed:  Orders Placed This Encounter   Procedures   EKG 12-Lead   As part of today's office visit reviewed last office note, Zio patch results EKG ordered and independently labs from Aug 27, 2022.  Final Medication List:   No orders of the defined types were placed in this encounter.   There are no discontinued medications.   Current Outpatient Medications:    acyclovir (ZOVIRAX) 800 MG tablet, Take 800 mg by mouth as needed., Disp: , Rfl:    buPROPion (WELLBUTRIN XL) 300 MG 24 hr tablet, Take 1 tablet (300 mg total) by mouth daily., Disp: 90 tablet, Rfl: 0   cholecalciferol (VITAMIN D3) 25 MCG (1000 UNIT) tablet, Take 1,000 Units by mouth daily., Disp: , Rfl:    ibuprofen (ADVIL) 200 MG tablet, Take 200 mg by mouth as needed., Disp: , Rfl:    loratadine (CLARITIN) 10 MG tablet, Take 10 mg by mouth daily., Disp: , Rfl:    losartan (COZAAR) 50 MG tablet, Take 1 tablet (50 mg total) by mouth in the morning., Disp: 180 tablet, Rfl: 1   multivitamin-iron-minerals-folic acid (CENTRUM) chewable tablet, Chew 1 tablet by mouth daily., Disp: , Rfl:    pantoprazole (PROTONIX) 20 MG tablet, TAKE 1 TABLET BY MOUTH DAILY AS  NEEDED, Disp: 90 tablet, Rfl: 3   Semaglutide-Weight Management (WEGOVY) 2.4 MG/0.75ML SOAJ, Inject 2.4 mg into the skin. Provided by [facility], Disp: , Rfl:    vitamin B-12 (CYANOCOBALAMIN) 100 MCG tablet, Take 100 mcg by mouth daily., Disp: , Rfl:   Consent:   NA  Disposition:   1 year follow-up sooner if needed.  Her questions and concerns were addressed to her satisfaction. She voices understanding of the recommendations provided during this encounter.    Signed, Delilah Shan, [facility]  [facility]  [address] [place], [place] [address] 06/23/2023 8:31 AM

## 2023-07-04 ENCOUNTER — Encounter (HOSPITAL_COMMUNITY): Payer: Self-pay | Admitting: Psychiatry

## 2023-07-04 ENCOUNTER — Telehealth (HOSPITAL_COMMUNITY): Admitting: Psychiatry

## 2023-07-04 VITALS — Wt 182.0 lb

## 2023-07-04 DIAGNOSIS — F330 Major depressive disorder, recurrent, mild: Secondary | ICD-10-CM | POA: Diagnosis not present

## 2023-07-04 DIAGNOSIS — F4321 Adjustment disorder with depressed mood: Secondary | ICD-10-CM | POA: Diagnosis not present

## 2023-07-04 DIAGNOSIS — F419 Anxiety disorder, unspecified: Secondary | ICD-10-CM

## 2023-07-04 MED ORDER — BUPROPION HCL ER (XL) 300 MG PO TB24: 300.0000 mg | ORAL_TABLET | Freq: Every day | ORAL | 0 refills | Status: DC

## 2023-07-04 NOTE — Progress Notes (Signed)
 [facility] MD Virtual Progress Note   Patient Location: Home Provider Location: Home Office  I connect with patient by video and verified that I am speaking with correct person by using two identifiers. I discussed the limitations of evaluation and management by telemedicine and the availability of in person appointments. I also discussed with the patient that there may be a patient responsible charge related to this service. The patient expressed understanding and agreed to proceed.  Shelby Rios [id number] 48 y.o.  07/04/2023 11:15 AM  History of Present Illness:  Patient is evaluated by video session.  She is taking Wellbutrin XL 300 mg as dose was reduced from 450.  She did not notice any worsening of symptoms.  She has chronic symptoms of distraction but able to do her job without an issue.  Recently she was awarded for her accomplishment at work.  She reported sleep mostly okay but lately noticed having some hot flashes and not sure if she is going through menopause.  She denies any crying spells or any feeling of hopelessness or worthlessness.  Recently had a visit from cardiology and her blood pressure medicine dose reduced because she is doing very well.  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  She is in therapy with [place] partners but recently find out that her insurance does not cover and now she need a new therapist.  Patient told she was talking to therapist about her grief losing her 72 year old uncle who was her father like figure.  She also discussed with her therapist about her past relationship with the mother which was sometime very difficult and challenging.  Patient reported daughter is doing well who is junior at [employer] and may go to [place] to work on [employer] center.  Patient works for [employer].  She feel medicine is working other than sometimes  struggle with focus and distraction.  She had a psychological testing and did not conclusive for ADHD.  She has no tremors, shakes or any EPS.  Past Psychiatric History: History of depression and anxiety.  Took Paxil, Prozac, Vistaril, Zoloft, Effexor, BuSpar and Wellbutrin.  Medicines are prescribed from PCP.  No history of suicidal attempt, inpatient treatment or mania.  Wellbutrin 450 did not work.  Had psychological testing [facility] for ADHD but it was inconclusive.   Outpatient Encounter Medications as of 07/04/2023  Medication Sig   acyclovir (ZOVIRAX) 800 MG tablet Take 800 mg by mouth as needed.   buPROPion (WELLBUTRIN XL) 300 MG 24 hr tablet Take 1 tablet (300 mg total) by mouth daily.   cholecalciferol (VITAMIN D3) 25 MCG (1000 UNIT) tablet Take 1,000 Units by mouth daily.   ibuprofen (ADVIL) 200 MG tablet Take 200 mg by mouth as needed.   loratadine (CLARITIN) 10 MG tablet Take 10 mg by mouth daily.   losartan (COZAAR) 50 MG tablet Take 1 tablet (50 mg total) by mouth in the morning.   multivitamin-iron-minerals-folic acid (CENTRUM) chewable tablet Chew 1 tablet by mouth daily.   pantoprazole (PROTONIX) 20 MG tablet TAKE 1 TABLET BY MOUTH DAILY AS  NEEDED   Semaglutide-Weight Management (WEGOVY) 2.4 MG/0.75ML SOAJ Inject 2.4 mg into the skin. Provided by [facility]   vitamin B-12 (CYANOCOBALAMIN) 100 MCG tablet Take 100 mcg by mouth daily.   No facility-administered encounter medications on file as of 07/04/2023.    No results  found for this or any previous visit (from the past 2160 hours).   Psychiatric Specialty Exam: Physical Exam  Review of Systems  Weight 182 lb (82.6 kg), last menstrual period 07/22/2006.There is no height or weight on file to calculate BMI.  General Appearance: Casual  Eye Contact:  Good  Speech:  Clear and Coherent  Volume:  Normal  Mood:  Euthymic  Affect:  Appropriate  Thought Process:  Goal Directed   Orientation:  Full (Time, Place, and Person)  Thought Content:  Logical  Suicidal Thoughts:  No  Homicidal Thoughts:  No  Memory:  Immediate;   Good Recent;   Good Remote;   Good  Judgement:  Good  Insight:  Good  Psychomotor Activity:  Normal  Concentration:  Concentration: Good and Attention Span: Good  Recall:  Good  Fund of Knowledge:  Good  Language:  Good  Akathisia:  No  Handed:  Right  AIMS (if indicated):     Assets:  Communication Skills Desire for Improvement Housing Social Support Talents/Skills Transportation  ADL's:  Intact  Cognition:  WNL  Sleep: Okay but having hot flashes     Assessment/Plan: MDD (major depressive disorder), recurrent episode, mild (HCC) - Plan: buPROPion (WELLBUTRIN XL) 300 MG 24 hr tablet  Anxiety - Plan: buPROPion (WELLBUTRIN XL) 300 MG 24 hr tablet  Grief  I reviewed notes from other provider.  Her psychological testing for ADHD was inconclusive.  Her blood pressure medicine cut down since she is doing better.  She is tolerating lower dose of Wellbutrin which was reduced 300.  She denies any major panic attack, crying spells or any feeling of hopelessness or worthlessness.  Encouraged to get hormone level checked to see if she going to premenopause.  She having hot flashes.  Patient started online classes focus on her health and calorie intake with her husband.  Encouraged to continue that.  We will refer to see a therapist to help her coping skills because current therapist is not in her network.  Patient feels comfortable with the Wellbutrin XL 300 mg daily.  Recommended to call us back if she is any question or any concern.  Follow-up in 3 months.   Follow Up Instructions:     I discussed the assessment and treatment plan with the patient. The patient was provided an opportunity to ask questions and all were answered. The patient agreed with the plan and demonstrated an understanding of the instructions.   The patient was advised  to call back or seek an in-person evaluation if the symptoms worsen or if the condition fails to improve as anticipated.    Collaboration of Care: Other provider involved in patient's care AEB notes are available in epic to review  Patient/Guardian was advised Release of Information must be obtained prior to any record release in order to collaborate their care with an outside provider. Patient/Guardian was advised if they have not already done so to contact the registration department to sign all necessary forms in order for [place] to release information regarding their care.   Consent: Patient/Guardian gives verbal consent for treatment and assignment of benefits for services provided during this visit. Patient/Guardian expressed understanding and agreed to proceed.     I provided 25 minutes of non face to face time during this encounter.  Note: This document was prepared by [employer] voice dictation technology and any errors that results from this process are unintentional.    Cleotis Nipper, MD 07/04/2023

## 2023-07-30 ENCOUNTER — Other Ambulatory Visit: Payer: Self-pay | Admitting: Cardiology

## 2023-07-30 DIAGNOSIS — I10 Essential (primary) hypertension: Secondary | ICD-10-CM

## 2023-08-02 ENCOUNTER — Encounter: Payer: Self-pay | Admitting: Cardiology

## 2023-08-04 ENCOUNTER — Ambulatory Visit: Attending: Cardiology | Admitting: Cardiology

## 2023-08-04 ENCOUNTER — Encounter: Payer: Self-pay | Admitting: Cardiology

## 2023-08-04 VITALS — BP 132/83 | HR 74 | Resp 16 | Ht 67.0 in | Wt 180.0 lb

## 2023-08-04 DIAGNOSIS — I10 Essential (primary) hypertension: Secondary | ICD-10-CM

## 2023-08-04 DIAGNOSIS — Z8249 Family history of ischemic heart disease and other diseases of the circulatory system: Secondary | ICD-10-CM

## 2023-08-04 DIAGNOSIS — G4733 Obstructive sleep apnea (adult) (pediatric): Secondary | ICD-10-CM

## 2023-08-04 MED ORDER — LOSARTAN POTASSIUM 25 MG PO TABS
25.0000 mg | ORAL_TABLET | Freq: Every morning | ORAL | 3 refills | Status: DC
Start: 2023-08-04 — End: 2023-09-29

## 2023-08-04 NOTE — Patient Instructions (Signed)
 Medication Instructions:  Your physician has recommended you make the following change in your medication:   DECREASE Losartan to 25 mg once daily    *If you need a refill on your cardiac medications before your next appointment, please call your pharmacy*  Lab Work: None ordered today. If you have labs (blood work) drawn today and your tests are completely normal, you will receive your results only by: MyChart Message (if you have MyChart) OR A paper copy in the mail If you have any lab test that is abnormal or we need to change your treatment, we will call you to review the results.  Testing/Procedures: None ordered today.  Follow-Up: At [facility], you and your health needs are our priority.  As part of our continuing mission to provide you with exceptional heart care, we have created designated Provider Care Teams.  These Care Teams include your primary Cardiologist (physician) and Advanced Practice Providers (APPs -  Physician Assistants and Nurse Practitioners) who all work together to provide you with the care you need, when you need it.  We recommend signing up for the patient portal called "MyChart".  Sign up information is provided on this After Visit Summary.  MyChart is used to connect with patients for Virtual Visits (Telemedicine).  Patients are able to view lab/test results, encounter notes, upcoming appointments, etc.  Non-urgent messages can be sent to your provider as well.   To learn more about what you can do with MyChart, go to [email or web address].    Your next appointment:   1 year(s)  The format for your next appointment:   In Person  Provider:   Olinda Bertrand, DO{  Other Instructions    1st Floor: - Lobby - Registration  - Pharmacy  - Lab - Cafe  2nd Floor: - PV Lab - Diagnostic Testing (echo, CT, nuclear med)  3rd Floor: - Vacant  4th Floor: - TCTS (cardiothoracic surgery) - AFib Clinic - Structural Heart Clinic - Vascular Surgery   - Vascular Ultrasound  5th Floor: - HeartCare Cardiology (general and EP) - Clinical Pharmacy for coumadin, hypertension, lipid, weight-loss medications, and med management appointments    Valet parking services will be available as well.

## 2023-08-04 NOTE — Progress Notes (Signed)
 Cardiology Office Note:  .   Date:  08/04/2023  ID:  Shelby Rios, DOB [birth year], MRN [id number] PCP:  Shelby Organ, NP  Former Cardiology Providers: Dr. Belva Boyden  [facility] Providers Cardiologist:  Olinda Bertrand, DO , [facility] (established care 01/21/2022 ) Electrophysiologist:  None  Click to update primary MD,subspecialty MD or APP then REFRESH:1}    Chief Complaint  Patient presents with   Follow-up    Lightheaded and dizziness, near syncope    History of Present Illness: .   Shelby Rios is a 48 y.o. Caucasian female whose past medical history and cardiovascular risk factors includes: Family history of premature CAD, hypertension, OSA.   Patient was referred to the office back in 2023 given her family history of CAD.  She has undergone appropriate cardiovascular workup including echocardiogram, GXT, coronary calcium score.  Overall doing well from a cardiovascular standpoint.  However since last office visit patient endorses feeling lightheaded and dizzy at times.  Initially did not think much of it but started checking her blood pressures and last week she noticed her SBP at times being as low as 98 mmHg - 100 mmHg.  She held her losartan since last Friday, August 01, 2023 and her symptoms have overall improved.  She is currently on losartan 50 mg p.o. daily.  Of note, with lifestyle changes and pharmacological therapy patient has lost approximately 30 pounds which may be contributing to her symptoms as well.  Father had CABG at the age of 60 and other confounding factors included smoking, diabetes, PAD. He died at the age of 76 due to complications of sepsis. Mother had CVA around the age of 22 with no significant residual deficits according to the patient. Sister died at the age of 65 likely secondary to potassium hemostasis as she would require frequent treatments for Chiari malformation. And paternal side has significant amount of CAD at a young age (less than 47  years of age).   Review of Systems: .   Review of Systems  Constitutional: Positive for weight loss (2#).  Cardiovascular:  Positive for near-syncope. Negative for chest pain, claudication, irregular heartbeat, leg swelling, orthopnea, palpitations, paroxysmal nocturnal dyspnea and syncope.  Respiratory:  Negative for shortness of breath.   Hematologic/Lymphatic: Negative for bleeding problem.  Neurological:  Positive for dizziness and light-headedness.    Studies Reviewed:   EKG: EKG Interpretation Date/Time:  Monday August 04 2023 16:40:39 EDT Ventricular Rate:  79 PR Interval:  128 QRS Duration:  106 QT Interval:  392 QTC Calculation: 449 R Axis:   54  Text Interpretation: Normal sinus rhythm Incomplete right bundle branch block Nonspecific ST abnormality When compared with ECG of 23-Jun-2023 08:10, No significant change was found Confirmed by Olinda Bertrand [phone]) on 08/04/2023 4:54:44 PM  Echocardiogram: 01/23/2022: Normal LV systolic function with visual EF 60-65%. Left ventricle cavity is normal in size. Normal left ventricular wall thickness. Normal global wall motion. Normal diastolic filling pattern, normal LAP.  Mild tricuspid regurgitation. No evidence of pulmonary hypertension. No prior study for comparison.      Stress Testing: Exercise treadmill stress test 01/31/2022: Exercise treadmill stress test performed using Bruce protocol.  Patient reached 8.3 METS, and 98% of age predicted maximum heart rate.  Exercise capacity was fair.  No chest pain reported.  Normal heart rate and hemodynamic response. Stress EKG revealed no ischemic changes. Low risk study.    Coronary calcium score 02/02/2022: No visible coronary artery calcifications. Total coronary calcium  score of 0. No acute or significant extracardiac abnormality.   Cardiac monitor (Zio Patch): 03/05/2023-03/19/2023 Dominant rhythm sinus rhythm. Heart rate 58-157 bpm. Avg HR 81 bpm. No atrial fibrillation  detected during the monitoring period. No ventricular tachycardia, high grade AV block, pauses (3 seconds or longer). Total supraventricular ectopic burden <1%. Total ventricular ectopic burden <1%. Patient triggered events: 3. Underlying rhythm is sinus without ectopy.   RADIOLOGY: NA  Risk Assessment/Calculations:   N/A   Labs:       Latest Ref Rng & Units 01/06/2023   11:09 AM 07/20/2021    2:54 PM 07/16/2021    2:03 PM  CBC  WBC 3.4 - 10.8 x10E3/uL 5.1  9.6  CANCELED   Hemoglobin 11.1 - 15.9 g/dL 16.1  09.6    Hematocrit 34.0 - 46.6 % 41.6  39.4    Platelets 150 - 450 x10E3/uL 299  332         Latest Ref Rng & Units 08/27/2022    9:09 AM 01/03/2022    9:45 AM 07/16/2021    2:03 PM  BMP  Glucose 70 - 99 mg/dL 88  045  409   BUN 6 - 24 mg/dL 11  10  5    Creatinine 0.57 - 1.00 mg/dL 8.11  9.14  7.82   BUN/Creat Ratio 9 - 23 14  13  6    Sodium 134 - 144 mmol/L 137  138  137   Potassium 3.5 - 5.2 mmol/L 4.8  4.0  4.1   Chloride 96 - 106 mmol/L 100  101  98   CO2 20 - 29 mmol/L 24  21  24    Calcium 8.7 - 10.2 mg/dL 9.0  9.0  9.1       Latest Ref Rng & Units 08/27/2022    9:09 AM 01/03/2022    9:45 AM 07/16/2021    2:03 PM  CMP  Glucose 70 - 99 mg/dL 88  956  213   BUN 6 - 24 mg/dL 11  10  5    Creatinine 0.57 - 1.00 mg/dL 0.86  5.78  4.69   Sodium 134 - 144 mmol/L 137  138  137   Potassium 3.5 - 5.2 mmol/L 4.8  4.0  4.1   Chloride 96 - 106 mmol/L 100  101  98   CO2 20 - 29 mmol/L 24  21  24    Calcium 8.7 - 10.2 mg/dL 9.0  9.0  9.1   Total Protein 6.0 - 8.5 g/dL  7.1    Total Bilirubin 0.0 - 1.2 mg/dL  0.3    Alkaline Phos 44 - 121 IU/L  91    AST 0 - 40 IU/L  17    ALT 0 - 32 IU/L  26      Lab Results  Component Value Date   CHOL 183 08/27/2022   HDL 52 08/27/2022   LDLCALC 118 (H) 08/27/2022   TRIG 69 08/27/2022   CHOLHDL 3.5 01/03/2022   No results for input(s): "LIPOA" in the last 8760 hours. No components found for: "NTPROBNP" No results for input(s):  "PROBNP" in the last 8760 hours. Recent Labs    01/06/23 1109  TSH 1.160    Physical Exam:    Today's Vitals   08/04/23 1636 08/04/23 1642 08/04/23 1646 08/04/23 1650  BP: 132/83     Pulse: 74     Resp: 16     SpO2: 99% 99% 98% 98%  Weight: 180 lb (81.6 kg)  Height: 5\' 7"  (1.702 m)      Body mass index is 28.19 kg/m. Wt Readings from Last 3 Encounters:  08/04/23 180 lb (81.6 kg)  06/23/23 182 lb 3.2 oz (82.6 kg)  01/06/23 195 lb 12.8 oz (88.8 kg)    Orthostatic VS for the past 72 hrs (Last 3 readings):  Orthostatic BP Patient Position BP Location Cuff Size Orthostatic Pulse  08/04/23 1650 124/80 Standing Left Arm Large 93  08/04/23 1646 116/78 Sitting Left Arm Large 75  08/04/23 1642 118/76 Supine Left Arm Large 73     Physical Exam  Constitutional: No distress.  hemodynamically stable  Neck: No JVD present.  Cardiovascular: Normal rate, regular rhythm, S1 normal and S2 normal. Exam reveals no gallop, no S3, no S4 and no friction rub.  No murmur heard. Pulmonary/Chest: Effort normal and breath sounds normal. No stridor. She has no wheezes. She has no rales. She exhibits no tenderness.  Abdominal: Soft. Bowel sounds are normal. She exhibits no distension. There is no abdominal tenderness.  Musculoskeletal:        General: No tenderness or edema.     Cervical back: Neck supple.  Neurological: She is alert and oriented to person, place, and time.  Skin: Skin is warm and dry.     Impression & Recommendation(s):  Impression:   ICD-10-CM   1. Benign hypertension  I10 EKG 12-Lead    losartan (COZAAR) 25 MG tablet    2. OSA on CPAP  G47.33     3. Family history of premature CAD  Z82.49        Recommendation(s):  Benign hypertension Office blood pressures are acceptable, not on medical therapy since last weekend. Has lost approximately 30 pounds in the recent past due to lifestyle changes, pharmacological therapy, and increased physical activity.  Given her  weight loss I suspect that she is overtreated for her benign essential hypertension. Will lower the dose of losartan to 25 mg p.o. daily. If she continues to have similar symptoms despite a lower dose of losartan she is advised to stop and to monitor her blood pressures clinically.  Once she starts noticing a trend upward reinitiation of antihypertensive medications can be considered.  However if she feels well at a lower dose of losartan she is advised to continue No additional testing is warranted at this time. Orthostatic vital signs negative.  OSA on CPAP Reemphasized importance of device therapy/compliance  Family history of premature CAD See HPI Coronary calcium score 0. GXT low risk. Echocardiogram: Preserved LVEF, normal diastolic function, no significant valvular heart disease EKG nonischemic  Orders Placed:  Orders Placed This Encounter  Procedures   EKG 12-Lead    Final Medication List:    Meds ordered this encounter  Medications   losartan (COZAAR) 25 MG tablet    Sig: Take 1 tablet (25 mg total) by mouth every morning.    Dispense:  30 tablet    Refill:  3    Decreasing to 25 mg    Medications Discontinued During This Encounter  Medication Reason   losartan (COZAAR) 50 MG tablet      Current Outpatient Medications:    acyclovir (ZOVIRAX) 800 MG tablet, Take 800 mg by mouth as needed., Disp: , Rfl:    buPROPion (WELLBUTRIN XL) 300 MG 24 hr tablet, Take 1 tablet (300 mg total) by mouth daily., Disp: 90 tablet, Rfl: 0   cholecalciferol (VITAMIN D3) 25 MCG (1000 UNIT) tablet, Take 1,000 Units by mouth daily., Disp: ,  Rfl:    ibuprofen (ADVIL) 200 MG tablet, Take 200 mg by mouth as needed., Disp: , Rfl:    loratadine (CLARITIN) 10 MG tablet, Take 10 mg by mouth daily., Disp: , Rfl:    multivitamin-iron-minerals-folic acid (CENTRUM) chewable tablet, Chew 1 tablet by mouth daily., Disp: , Rfl:    pantoprazole (PROTONIX) 20 MG tablet, TAKE 1 TABLET BY MOUTH DAILY AS   NEEDED, Disp: 90 tablet, Rfl: 3   Semaglutide-Weight Management (WEGOVY) 2.4 MG/0.75ML SOAJ, Inject 2.4 mg into the skin. Provided by [facility], Disp: , Rfl:    vitamin B-12 (CYANOCOBALAMIN) 100 MCG tablet, Take 100 mcg by mouth daily., Disp: , Rfl:    losartan (COZAAR) 25 MG tablet, Take 1 tablet (25 mg total) by mouth every morning., Disp: 30 tablet, Rfl: 3  Consent:   NA  Disposition:   1 year follow-up sooner if needed.  Her questions and concerns were addressed to her satisfaction. She voices understanding of the recommendations provided during this encounter.    Signed, Olinda Bertrand, DO, [facility]  [facility]  [address] [place], [place] [address] 08/04/2023 5:24 PM

## 2023-09-03 ENCOUNTER — Ambulatory Visit (INDEPENDENT_AMBULATORY_CARE_PROVIDER_SITE_OTHER): Admitting: Licensed Clinical Social Worker

## 2023-09-03 DIAGNOSIS — F330 Major depressive disorder, recurrent, mild: Secondary | ICD-10-CM | POA: Diagnosis not present

## 2023-09-03 DIAGNOSIS — F419 Anxiety disorder, unspecified: Secondary | ICD-10-CM

## 2023-09-03 DIAGNOSIS — F4321 Adjustment disorder with depressed mood: Secondary | ICD-10-CM | POA: Diagnosis not present
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
## 2023-09-29 ENCOUNTER — Encounter: Payer: Self-pay | Admitting: Nurse Practitioner

## 2023-09-29 ENCOUNTER — Ambulatory Visit: Admitting: Nurse Practitioner

## 2023-09-29 VITALS — BP 114/70 | HR 70 | Ht 65.75 in | Wt 181.0 lb

## 2023-09-29 DIAGNOSIS — L659 Nonscarring hair loss, unspecified: Secondary | ICD-10-CM | POA: Diagnosis not present

## 2023-09-29 DIAGNOSIS — Z79890 Hormone replacement therapy (postmenopausal): Secondary | ICD-10-CM | POA: Diagnosis not present

## 2023-09-29 DIAGNOSIS — R61 Generalized hyperhidrosis: Secondary | ICD-10-CM

## 2023-09-29 DIAGNOSIS — R5383 Other fatigue: Secondary | ICD-10-CM | POA: Diagnosis not present

## 2023-09-29 MED ORDER — ESTRADIOL 0.025 MG/24HR TD PTTW: 1.0000 | MEDICATED_PATCH | TRANSDERMAL | 1 refills | Status: DC

## 2023-09-29 NOTE — Progress Notes (Signed)
   Acute Office Visit  Subjective:    Patient ID: Shelby Rios, female    DOB: 1975-07-10, 48 y.o.   MRN: [id number]   HPI 48 y.o. [id number] presents new patient for menopausal symptoms for about 6 months. Complains of night sweats, fatigue, brain fog, joint pain, hair loss, GI issues, breast tenderness, decreased libido. Thought some of her symptoms were from grief after losing 3 family members. Saw grief counselor, was also evaluated for ADHD and negative. Began taking magnesium, B12, and Vit D supplements. Symptoms have not changed. Her brain fog is most bothersome and affecting her work. Went back into the office, removed distractions and still having hard time. Does feel hair loss has slowed down and is seeing new growth. S/P [facility] 2008 for endometriosis.   Patient's last menstrual period was 07/22/2006.    Review of Systems  Constitutional:  Positive for fatigue.  Endocrine: Positive for heat intolerance.  Genitourinary:        Decreased libido  Musculoskeletal:        Joint pain  Skin:        Hair loss  Psychiatric/Behavioral:  Positive for decreased concentration.        Objective:     Physical Exam Constitutional:      Appearance: Normal appearance.     BP 114/70   Pulse 70   Ht 5' 5.75" (1.67 m)   Wt 181 lb (82.1 kg)   LMP 07/22/2006   SpO2 97%   BMI 29.44 kg/m  Wt Readings from Last 3 Encounters:  09/29/23 181 lb (82.1 kg)  08/04/23 180 lb (81.6 kg)  06/23/23 182 lb 3.2 oz (82.6 kg)        Assessment & Plan:   Problem List Items Addressed This Visit   None Visit Diagnoses       Fatigue, unspecified type    -  Primary   Relevant Orders   CBC with Differential/Platelet   Comprehensive metabolic panel with GFR   Vitamin B12   VITAMIN D  25 Hydroxy (Vit-D Deficiency, Fractures)   TSH     Night sweats       Relevant Orders   Follicle stimulating hormone   TSH   Estradiol      Hair loss       Relevant Orders   CBC with Differential/Platelet    Comprehensive metabolic panel with GFR   VITAMIN D  25 Hydroxy (Vit-D Deficiency, Fractures)   TSH     Postmenopausal hormone therapy       Relevant Medications   estradiol  (VIVELLE -DOT) 0.025 MG/24HR      Plan: Discussed menopausal symptoms and management options. Will check labs today to rule out other causes for some of her symptoms. Interested in HRT. Educated on proper use, risks and benefits. Twice weekly patch 0.025 mg. Mag L-threonate recommended.   Return for 4-6 weeks for Annual, Med follow up.    Andee Bamberger DNP, 12:31 PM 09/29/2023

## 2023-09-30 ENCOUNTER — Ambulatory Visit: Payer: Self-pay | Admitting: Nurse Practitioner

## 2023-09-30 LAB — COMPREHENSIVE METABOLIC PANEL WITH GFR
ALT: 9 IU/L (ref 0–32)
AST: 16 IU/L (ref 0–40)
Albumin: 4 g/dL (ref 3.9–4.9)
Alkaline Phosphatase: 75 IU/L (ref 44–121)
BUN/Creatinine Ratio: 16 (ref 9–23)
BUN: 10 mg/dL (ref 6–24)
Bilirubin Total: <0.2 mg/dL (ref 0.0–1.2)
CO2: 25 mmol/L (ref 20–29)
Calcium: 9.1 mg/dL (ref 8.7–10.2)
Chloride: 101 mmol/L (ref 96–106)
Creatinine, Ser: 0.61 mg/dL (ref 0.57–1.00)
Globulin, Total: 3 g/dL (ref 1.5–4.5)
Glucose: 88 mg/dL (ref 70–99)
Potassium: 4.3 mmol/L (ref 3.5–5.2)
Sodium: 140 mmol/L (ref 134–144)
Total Protein: 7 g/dL (ref 6.0–8.5)
eGFR: 110 mL/min/{1.73_m2} (ref >59)

## 2023-09-30 LAB — CBC WITH DIFFERENTIAL/PLATELET
Basophils Absolute: 0 10*3/uL (ref 0.0–0.2)
Basos: 1 %
EOS (ABSOLUTE): 0.4 10*3/uL (ref 0.0–0.4)
Eos: 6 %
Hematocrit: 39 % (ref 34.0–46.6)
Hemoglobin: 12.7 g/dL (ref 11.1–15.9)
Immature Grans (Abs): 0 10*3/uL (ref 0.0–0.1)
Immature Granulocytes: 0 %
Lymphocytes Absolute: 2 10*3/uL (ref 0.7–3.1)
Lymphs: 35 %
MCH: 29.7 pg (ref 26.6–33.0)
MCHC: 32.6 g/dL (ref 31.5–35.7)
MCV: 91 fL (ref 79–97)
Monocytes Absolute: 0.6 10*3/uL (ref 0.1–0.9)
Monocytes: 10 %
Neutrophils Absolute: 2.8 10*3/uL (ref 1.4–7.0)
Neutrophils: 48 %
Platelets: 292 10*3/uL (ref 150–450)
RBC: 4.28 x10E6/uL (ref 3.77–5.28)
RDW: 12.3 % (ref 11.7–15.4)
WBC: 5.7 10*3/uL (ref 3.4–10.8)

## 2023-09-30 LAB — ESTRADIOL: Estradiol: 44.2 pg/mL

## 2023-09-30 LAB — VITAMIN B12: Vitamin B-12: 1376 pg/mL — ABNORMAL HIGH (ref 232–1245)

## 2023-09-30 LAB — VITAMIN D 25 HYDROXY (VIT D DEFICIENCY, FRACTURES): Vit D, 25-Hydroxy: 31.6 ng/mL (ref 30.0–100.0)

## 2023-09-30 LAB — TSH: TSH: 1.43 u[IU]/mL (ref 0.450–4.500)

## 2023-09-30 LAB — FOLLICLE STIMULATING HORMONE: FSH: 30 m[IU]/mL

## 2023-09-30 LAB — LUTEINIZING HORMONE: LH: 17.6 m[IU]/mL

## 2023-10-01 ENCOUNTER — Encounter (HOSPITAL_COMMUNITY): Payer: Self-pay

## 2023-10-01 ENCOUNTER — Ambulatory Visit (HOSPITAL_COMMUNITY): Admitting: Licensed Clinical Social Worker

## 2023-10-01 DIAGNOSIS — F330 Major depressive disorder, recurrent, mild: Secondary | ICD-10-CM

## 2023-10-01 NOTE — Progress Notes (Signed)
 THERAPIST PROGRESS NOTE   Session Date: 10/01/2023  Session Time: 1010 - 1110  Participation Level: Active  Behavioral Response: CasualAlertEuthymic  Type of Therapy: Individual Therapy  Treatment Goals addressed:   Initial (6) LTG: Increase coping skills to manage depression and improve ability to perform daily activities (OP Depression) STG: Reduce overall depression score by a minimum of 25% on the Patient Health Questionnaire (PHQ-9) (OP Depression) STG: [name] will identify cognitive patterns and beliefs that support depression (OP Depression) STG: [name] will reduce frequency of avoidant behaviors by 50% as evidenced by self-report in therapy sessions (OP Depression) STG: [name] will practice behavioral activation skills 3x per week for the next 24 weeks (OP Depression) LTG: Setting healthy boundaries, recognize I can't fix other people's problems, so that I'm not using all of my energy to try helping someone else (OP Depression)  Progress Towards Goals: Initial  Interventions: CBT, Motivational Interviewing, Solution Focused, Strength-based, and Supportive  Summary: Shelby Rios is a 48 y.o. female with psych history of MDD (major depressive disorder), recurrent episode, mild (HCC), presenting for follow-up therapy session in efforts to improve management of depressive symptoms.   Patient actively engaged in session, presenting in pleasant moods, and congruent affect throughout duration of visit. Patient openly engaged in introductory check-in, sharing of things going well overall, providing further brief recounts of events of the past month and factors contributing to presenting moods. Actively engaged in reassessing presenting depressive sxs via PHQ-9, processing minor reduction in screening score in comparison to prior scores, and reflecting on event contributing to variances in scores. Pt further shared of having engaged in bird watching/listening outing with husband over recent weeks,  finding enjoyable and grounding. Pt briefly detailed of having observed mood being impacted by interactions with family members on birthday, proving to negatively impact mood. Pt actively engaged in brief discussion surrounding sleep and eating habits that prove to have significant implications on moods and behaviors, proving open to continuing discussion throughout future visits. Actively engaged in exploration of various tx goals applicable to management of depressive sxs and desired improvements in finalization of individualized tx plan.  Patient responded well to interventions. Patient continues to meet criteria for MDD (major depressive disorder), recurrent episode, mild (HCC). Patient will continue to benefit from engagement in outpatient therapy due to being the least restrictive service to meet presenting needs.       10/01/2023   10:14 AM 09/03/2023    8:17 AM 01/06/2023   10:15 AM 09/23/2022   11:03 AM 06/17/2022    2:22 PM  Depression screen PHQ 2/9  Decreased Interest 0 0 0 1 1  Down, Depressed, Hopeless 1 0 0 1 1  PHQ - 2 Score 1 0 0 2 2  Altered sleeping 1 1 2 2 2   Tired, decreased energy 1 2 2 2 2   Change in appetite 0 0 0 0 0  Feeling bad or failure about yourself  0 0 0 0 0  Trouble concentrating 1 3 2 3 2   Moving slowly or fidgety/restless 0 0 0 0 0  Suicidal thoughts 0 0 0 0 0  PHQ-9 Score 4 6 6 9 8   Difficult doing work/chores Somewhat difficult Very difficult Somewhat difficult Somewhat difficult Somewhat difficult   Flowsheet Row Counselor from 09/03/2023 in [facility] Visit from [facility]/23/2022 [facility]  C-SSRS RISK CATEGORY No Risk No Risk        Suicidal/Homicidal: Nowithout intent/plan  Therapist  Response:    Clinician utilized CBT, MI, Solution focused, strengths based, and supportive reflection interventions to support patient in efforts to address presenting sxs and  challenges surrounding presenting stressors.  Clinician actively greeted pt upon presenting for today's visit, assessing presenting moods and affect, engaging pt in introductory check-in, and eliciting patient's recounts of recent events, and factors contributing to presenting moods. Actively listened to patient's reflections of events of the past month since previous session, utilizing open ended questions to evoke greater exploration and processing of thoughts, feelings, and perspectives in relation to recent events and stressors. Actively engaged pt in re-administering of PHQ-9 and GAD-7, further involving in processing of current scores, and exploration of variances in reported sxs. Engaged pt in review of initial visit, identifying areas of concern, and supporting pt in processing overall desires for improvement in management of sxs in efforts to develop individualized goals to be outlined in tx plan.  Clinician reassessed severity of presenting sxs, and presence of any safety concerns. Clinician provided support and empathy to patient during session.  Plan: Return again in 2 weeks.  Diagnosis:  Encounter Diagnosis  Name Primary?   MDD (major depressive disorder), recurrent episode, mild (HCC) Yes    Collaboration of Care: Psychiatrist AEB provider documentation available in EHR.  Patient/Guardian was advised Release of Information must be obtained prior to any record release in order to collaborate their care with an outside provider. Patient/Guardian was advised if they have not already done so to contact the registration department to sign all necessary forms in order for us  to release information regarding their care.   Consent: Patient/Guardian gives verbal consent for treatment and assignment of benefits for services provided during this visit. Patient/Guardian expressed understanding and agreed to proceed.   Patsi Boots, MSW, LCSW 10/01/2023,  10:16 AM

## 2023-10-06 ENCOUNTER — Encounter (HOSPITAL_COMMUNITY): Payer: Self-pay | Admitting: Psychiatry

## 2023-10-06 ENCOUNTER — Telehealth (HOSPITAL_BASED_OUTPATIENT_CLINIC_OR_DEPARTMENT_OTHER): Admitting: Psychiatry

## 2023-10-06 ENCOUNTER — Other Ambulatory Visit: Payer: Self-pay | Admitting: Nurse Practitioner

## 2023-10-06 DIAGNOSIS — F419 Anxiety disorder, unspecified: Secondary | ICD-10-CM

## 2023-10-06 DIAGNOSIS — F330 Major depressive disorder, recurrent, mild: Secondary | ICD-10-CM | POA: Diagnosis not present

## 2023-10-06 DIAGNOSIS — K219 Gastro-esophageal reflux disease without esophagitis: Secondary | ICD-10-CM

## 2023-10-06 MED ORDER — BUPROPION HCL ER (XL) 300 MG PO TB24
300.0000 mg | ORAL_TABLET | Freq: Every day | ORAL | 0 refills | Status: DC
Start: 2023-10-06 — End: 2024-01-05

## 2023-10-06 NOTE — Progress Notes (Signed)
 [facility] MD Virtual Progress Note   Patient Location: Home Provider Location: Home Office  I connect with patient by video and verified that I am speaking with correct person by using two identifiers. I discussed the limitations of evaluation and management by telemedicine and the availability of in person appointments. I also discussed with the patient that there may be a patient responsible charge related to this service. The patient expressed understanding and agreed to proceed.  Shelby Rios [id number] 48 y.o.  10/06/2023 9:52 AM  History of Present Illness:  Patient is evaluated by video session.  She is taking Wellbutrin  XL 300 mg in the morning and that seems to be working okay.  Sometimes she has chronic distraction and issue with multitasking but it is not affecting her job.  Patient saw OB/GYN recently and now taking hormone replacement therapy to help her hot flashes.  Patient told it is too early because medicine started last Monday.  She lost few pounds and she is pleased that she does not need to take blood pressure medicine.  She is checking her blood pressure 2 3 times a day and she is happy that readings are normal.  Since she lost the weight her blood pressure is better.  She also taking Wellbutrin  300 mg only that may also helped her blood pressure.  She used to take 450 mg.  She is also taking magnesium.  Recently she was given a walker for recommendation at work and she was very happy about it.  She finished grief counseling from hospice after she lost her 39 year old uncle who was like father to her.  Patient told family has a plan to visit [place]  in summer.  Patient works for [employer] and few days in a month she goes in person.  She started therapy with Royston Cornea that has been working well.  Patient told her daughter had a good trip to [place] and she really enjoyed the culture and the country.  Patient does not want to change the medication since it is helping her  anxiety, depression, focus.  She is open since started hormone replacement therapy her hot flashes also get better.  She has a follow-up appointment with OB/GYN in few weeks for checkup.    Past Psychiatric History: H/O depression and anxiety.  Took Paxil, Prozac, Vistaril, Zoloft , Effexor , BuSpar  and Wellbutrin .  Medicines are prescribed from PCP.  No history of suicidal attempt, inpatient treatment or mania.  Wellbutrin  450 did not work.  [facility] psychological [facility] for [facility] but it was inconclusive.    Outpatient Encounter Medications as of 10/06/2023  Medication Sig   buPROPion  (WELLBUTRIN  XL) 300 MG 24 hr tablet Take 1 tablet (300 mg total) by mouth daily.   cholecalciferol (VITAMIN D3) 25 MCG (1000 UNIT) tablet Take 1,000 Units by mouth daily.   estradiol  (VIVELLE -DOT) 0.025 MG/24HR Place 1 patch onto the skin 2 (two) times a week.   ibuprofen (ADVIL) 200 MG tablet Take 200 mg by mouth as needed.   loratadine (CLARITIN) 10 MG tablet Take 10 mg by mouth daily.   MAGNESIUM PO Take 250 mg by mouth.   multivitamin-iron-minerals-folic acid (CENTRUM) chewable tablet Chew 1 tablet by mouth daily.   pantoprazole  (PROTONIX ) 20 MG tablet TAKE 1 TABLET BY MOUTH DAILY AS  NEEDED   Semaglutide -Weight Management (WEGOVY ) 2.4 MG/0.75ML SOAJ Inject 2.4 mg into the skin. Provided by [facility]   vitamin B-12 (CYANOCOBALAMIN ) 100 MCG tablet Take 100 mcg by  mouth daily.   No facility-administered encounter medications on file as of 10/06/2023.    Recent Results (from the past 2160 hours)  CBC with Differential/Platelet     Status: None   Collection Time: 09/29/23 12:13 PM  Result Value Ref Range   WBC 5.7 3.4 - 10.8 x10E3/uL   RBC 4.28 3.77 - 5.28 x10E6/uL   Hemoglobin 12.7 11.1 - 15.9 g/dL   Hematocrit 13.0 86.5 - 46.6 %   MCV 91 79 - 97 fL   MCH 29.7 26.6 - 33.0 pg   MCHC 32.6 31.5 - 35.7 g/dL   RDW 78.4 69.6 - 29.5 %   Platelets 292 150 - 450 x10E3/uL    Neutrophils 48 Not Estab. %   Lymphs 35 Not Estab. %   Monocytes 10 Not Estab. %   Eos 6 Not Estab. %   Basos 1 Not Estab. %   Neutrophils Absolute 2.8 1.4 - 7.0 x10E3/uL   Lymphocytes Absolute 2.0 0.7 - 3.1 x10E3/uL   Monocytes Absolute 0.6 0.1 - 0.9 x10E3/uL   EOS (ABSOLUTE) 0.4 0.0 - 0.4 x10E3/uL   Basophils Absolute 0.0 0.0 - 0.2 x10E3/uL   Immature Granulocytes 0 Not Estab. %   Immature Grans (Abs) 0.0 0.0 - 0.1 x10E3/uL  Comprehensive metabolic panel with GFR     Status: None   Collection Time: 09/29/23 12:13 PM  Result Value Ref Range   Glucose 88 70 - 99 mg/dL   BUN 10 6 - 24 mg/dL   Creatinine, Ser 2.84 0.57 - 1.00 mg/dL   eGFR 132 >44 WN/UUV/2.53   BUN/Creatinine Ratio 16 9 - 23   Sodium 140 134 - 144 mmol/L   Potassium 4.3 3.5 - 5.2 mmol/L   Chloride 101 96 - 106 mmol/L   CO2 25 20 - 29 mmol/L   Calcium 9.1 8.7 - 10.2 mg/dL   Total Protein 7.0 6.0 - 8.5 g/dL   Albumin 4.0 3.9 - 4.9 g/dL   Globulin, Total 3.0 1.5 - 4.5 g/dL   Bilirubin Total <6.6 0.0 - 1.2 mg/dL   Alkaline Phosphatase 75 44 - 121 IU/L   AST 16 0 - 40 IU/L   ALT 9 0 - 32 IU/L  Vitamin B12     Status: Abnormal   Collection Time: 09/29/23 12:13 PM  Result Value Ref Range   Vitamin B-12 1,376 (H) 232 - 1,245 pg/mL  Follicle stimulating hormone     Status: None   Collection Time: 09/29/23 12:13 PM  Result Value Ref Range   FSH 30.0 mIU/mL    Comment:                      Adult Female             Range                       Follicular phase      3.5 -  12.5                       Ovulation phase       4.7 -  21.5                       Luteal phase          1.7 -   7.7  Postmenopausal       25.8 - 134.8   VITAMIN D  25 Hydroxy (Vit-D Deficiency, Fractures)     Status: None   Collection Time: 09/29/23 12:13 PM  Result Value Ref Range   Vit D, 25-Hydroxy 31.6 30.0 - 100.0 ng/mL    Comment: Vitamin D  deficiency has been defined by the Institute of Medicine and an Endocrine Society  practice guideline as a level of serum 25-OH vitamin D  less than 20 ng/mL (1,2). The Endocrine Society went on to further define vitamin D  insufficiency as a level between 21 and 29 ng/mL (2). 1. IOM (Institute of Medicine). 2010. Dietary reference    intakes for calcium and D. [place]: The    [employer]. 2. Holick MF, Binkley [employer], Bischoff-Ferrari HA, et al.    Evaluation, treatment, and prevention of vitamin D     deficiency: an Endocrine Society clinical practice    guideline. JCEM. 2011 Jul; 96(7):1911-30.   TSH     Status: None   Collection Time: 09/29/23 12:13 PM  Result Value Ref Range   TSH 1.430 0.450 - 4.500 uIU/mL  Estradiol      Status: None   Collection Time: 09/29/23 12:13 PM  Result Value Ref Range   Estradiol  44.2 pg/mL    Comment:                      Adult Female             Range                       Follicular phase     12.5 - 166.0                       Ovulation phase      85.8 - 498.0                       Luteal phase         43.8 - 211.0                       Postmenopausal       <6.0 -  54.7                      Pregnancy                       1st trimester     215.0 - >4300.0 Roche ECLIA methodology   Luteinizing hormone     Status: None   Collection Time: 09/29/23 12:13 PM  Result Value Ref Range   LH 17.6 mIU/mL    Comment:                      Adult Female              Range                       Follicular phase      2.4 -  12.6                       Ovulation phase      14.0 -  95.6  Luteal phase          1.0 -  11.4                       Postmenopausal        7.7 -  58.5      Psychiatric Specialty Exam: Physical Exam  Review of Systems  Weight 174 lb (78.9 kg), last menstrual period 07/22/2006.There is no height or weight on file to calculate BMI.  General Appearance: Casual  Eye Contact:  Good  Speech:  Clear and Coherent  Volume:  Normal  Mood:  Euthymic  Affect:  Appropriate  Thought  Process:  Goal Directed  Orientation:  Full (Time, Place, and Person)  Thought Content:  Logical  Suicidal Thoughts:  No  Homicidal Thoughts:  No  Memory:  Immediate;   Good Recent;   Good Remote;   Good  Judgement:  Good  Insight:  Present  Psychomotor Activity:  Normal  Concentration:  Concentration: Good and Attention Span: Good  Recall:  Good  Fund of Knowledge:  Good  Language:  Good  Akathisia:  No  Handed:  Right  AIMS (if indicated):     Assets:  Communication Skills Desire for Improvement Social Support Talents/Skills Transportation  ADL's:  Intact  Cognition:  WNL  Sleep:  still hot flashes and sweating       10/01/2023   10:14 AM 09/03/2023    8:17 AM 01/06/2023   10:15 AM 09/23/2022   11:03 AM 06/17/2022    2:22 PM  Depression screen PHQ 2/9  Decreased Interest 0 0 0 1 1  Down, Depressed, Hopeless 1 0 0 1 1  PHQ - 2 Score 1 0 0 2 2  Altered sleeping 1 1 2 2 2   Tired, decreased energy 1 2 2 2 2   Change in appetite 0 0 0 0 0  Feeling bad or failure about yourself  0 0 0 0 0  Trouble concentrating 1 3 2 3 2   Moving slowly or fidgety/restless 0 0 0 0 0  Suicidal thoughts 0 0 0 0 0  PHQ-9 Score 4 6 6 9 8   Difficult doing work/chores Somewhat difficult Very difficult Somewhat difficult Somewhat difficult Somewhat difficult    Assessment/Plan: Anxiety - Plan: buPROPion  (WELLBUTRIN  XL) 300 MG 24 hr tablet  MDD (major depressive disorder), recurrent episode, mild (HCC) - Plan: buPROPion  (WELLBUTRIN  XL) 300 MG 24 hr tablet  Reviewed blood work results and current medication.  She is on hormone replacement therapy to help her hot flashes.  Her labs are otherwise stable.  Her attention concentration is manageable but is still have residual symptoms which are chronic.  We had tried higher dose of Wellbutrin  in the past but that did not help and she is back on 300.  She is no longer on blood pressure medicine.  Overall she feels good and she feels symptoms are  manageable.  Encouraged to continue therapy with Moses James.  Continue Wellbutrin  XL 300 mg in the morning.  Recommend to call us  back if she is any question or any concern.  Patient like to have her prescription filled at [facility].  Follow-up in 3 months.   Follow Up Instructions:     I discussed the assessment and treatment plan with the patient. The patient was provided an opportunity to ask questions and all were answered. The patient agreed with the plan and demonstrated an understanding of the instructions.   The patient  was advised to call back or seek an in-person evaluation if the symptoms worsen or if the condition fails to improve as anticipated.    Collaboration of Care: Other provider involved in patient's care AEB notes are available in epic to review  Patient/Guardian was advised Release of Information must be obtained prior to any record release in order to collaborate their care with an outside provider. Patient/Guardian was advised if they have not already done so to contact the registration department to sign all necessary forms in order for us  to release information regarding their care.   Consent: Patient/Guardian gives verbal consent for treatment and assignment of benefits for services provided during this visit. Patient/Guardian expressed understanding and agreed to proceed.     Total encounter time 28 minutes which includes face-to-face time, chart reviewed, care coordination, order entry and documentation during this encounter.   Note: This document was prepared by [employer] voice dictation technology and any errors that results from this process are unintentional.    Arturo Late, MD 10/06/2023

## 2023-10-15 ENCOUNTER — Ambulatory Visit (HOSPITAL_COMMUNITY): Admitting: Licensed Clinical Social Worker

## 2023-10-15 DIAGNOSIS — F330 Major depressive disorder, recurrent, mild: Secondary | ICD-10-CM

## 2023-10-15 DIAGNOSIS — F419 Anxiety disorder, unspecified: Secondary | ICD-10-CM | POA: Diagnosis not present

## 2023-10-15 NOTE — Progress Notes (Signed)
 THERAPIST PROGRESS NOTE   Session Date: 10/15/2023  Session Time: 0910 - 1015  Participation Level: Active  Behavioral Response: CasualAlertEuthymic  Type of Therapy: Individual Therapy  Treatment Goals addressed:   Initial (6) LTG: Increase coping skills to manage depression and improve ability to perform daily activities (OP Depression) STG: Reduce overall depression score by a minimum of 25% on the Patient Health Questionnaire (PHQ-9) (OP Depression) STG: Shelby Rios will identify cognitive patterns and beliefs that support depression (OP Depression) STG: Shelby Rios will reduce frequency of avoidant behaviors by 50% as evidenced by self-report in therapy sessions (OP Depression) STG: Shelby Rios will practice behavioral activation skills 3x per week for the next 24 weeks (OP Depression) LTG: Setting healthy boundaries, recognize I can't fix other people's problems, so that I'm not using all of my energy to try helping someone else (OP Depression)  Progress Towards Goals: Progressing  Interventions: CBT, Motivational Interviewing, Solution Focused, Strength-based, and Supportive  Summary: Shelby Rios is a 48 y.o. female with psych history of MDD and Anxiety, presenting for follow-up therapy session in efforts to improve management of depressive symptoms.   Patient actively engaged in session, presenting in pleasant moods, and congruent affect throughout duration of visit. 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 Further detailing of having been increasingly tired as there's a lot of planning that goes into it. Pt further reflected on hx of childhood/adolescence, growing up in church, and transitioning to alternate church home in adulthood that aligned more with individual beliefs and treatment of others. Further explored family hx, hx of relationships with uncle prior to passing, relationship with cousin  prior to and after uncles passing, and current nature of relationship with cousins daughters whom pt maintains close contact/support with. Pt processed feelings surrounding relationship challenges experienced throughout hx, and impact this continues to have on her to date.    Patient responded well to interventions. Patient continues to meet criteria for MDD and Anxiety. Patient will continue to benefit from engagement in outpatient therapy due to being the least restrictive service to meet presenting needs.       10/01/2023   10:14 AM 09/03/2023    8:17 AM 01/06/2023   10:15 AM 09/23/2022   11:03 AM 06/17/2022    2:22 PM  Depression screen PHQ 2/9  Decreased Interest 0 0 0 1 1  Down, Depressed, Hopeless 1 0 0 1 1  PHQ - 2 Score 1 0 0 2 2  Altered sleeping 1 1 2 2 2   Tired, decreased energy 1 2 2 2 2   Change in appetite 0 0 0 0 0  Feeling bad or failure about yourself  0 0 0 0 0  Trouble concentrating 1 3 2 3 2   Moving slowly or fidgety/restless 0 0 0 0 0  Suicidal thoughts 0 0 0 0 0  PHQ-9 Score 4 6 6 9 8   Difficult doing work/chores Somewhat difficult Very difficult Somewhat difficult Somewhat difficult Somewhat difficult   Flowsheet Row Counselor from 09/03/2023 in [facility] Visit from [facility]23/2022 in [facility]  C-SSRS RISK CATEGORY No Risk No Risk     Suicidal/Homicidal: Nowithout intent/plan  Therapist Response: Clinician utilized CBT, MI, Solution focused, strengths based, and supportive reflection interventions to support patient in efforts to address presenting sxs and challenges surrounding presenting stressors.  Clinician actively greeted pt upon presenting for today's visit,  assessing presenting moods and affect, engaging in introductory check-in, prompting pt's recounts of events of the past two weeks and factors contributing to presenting moods. Actively listened to pt's reflections of  recent events, increased stressors related to church responsibilities, and individual efforts at managing stressors. Utilized open ended questions to support pt in further processing thoughts, feelings, and perspectives in relation to events and stressors. Provided support and validation for pt's identified feelings and perspectives in relation to current and ongoing stressors surrounding family, management of uncles estate, and ongoing contentious relationship with cousin.  Clinician reassessed severity of presenting sxs, and presence of any safety concerns. Clinician provided support and empathy to patient during session.  Plan: Return again in 2 weeks.  Diagnosis:  Encounter Diagnoses  Name Primary?   MDD (major depressive disorder), recurrent episode, mild (HCC) Yes   Anxiety      Collaboration of Care: Psychiatrist AEB provider documentation available in EHR.  Patient/Guardian was advised Release of Information must be obtained prior to any record release in order to collaborate their care with an outside provider. Patient/Guardian was advised if they have not already done so to contact the registration department to sign all necessary forms in order for us  to release information regarding their care.   Consent: Patient/Guardian gives verbal consent for treatment and assignment of benefits for services provided during this visit. Patient/Guardian expressed understanding and agreed to proceed.   Lynwood JONETTA Maris, MSW, LCSW 10/15/2023,  9:14 AM

## 2023-10-19 ENCOUNTER — Other Ambulatory Visit: Payer: Self-pay | Admitting: Nurse Practitioner

## 2023-10-19 DIAGNOSIS — Z79890 Hormone replacement therapy (postmenopausal): Secondary | ICD-10-CM

## 2023-10-20 NOTE — Telephone Encounter (Signed)
 Medication refill request: vivelle  Dot 0.025 Last ov:  09/29/23  Next AEX: 10/28/23 Last MMG (if hormonal medication request): 03/04/23  Refill authorized: please advise

## 2023-10-27 ENCOUNTER — Emergency Department (HOSPITAL_COMMUNITY)

## 2023-10-27 ENCOUNTER — Ambulatory Visit: Payer: Self-pay

## 2023-10-27 ENCOUNTER — Encounter (HOSPITAL_COMMUNITY): Payer: Self-pay

## 2023-10-27 ENCOUNTER — Other Ambulatory Visit: Payer: Self-pay

## 2023-10-27 ENCOUNTER — Emergency Department (HOSPITAL_COMMUNITY)
Admission: EM | Admit: 2023-10-27 | Discharge: 2023-10-27 | Disposition: A | Discharge status: Home / Self Care | Attending: Emergency Medicine | Admitting: Emergency Medicine

## 2023-10-27 DIAGNOSIS — M79661 Pain in right lower leg: Secondary | ICD-10-CM | POA: Diagnosis not present

## 2023-10-27 DIAGNOSIS — I10 Essential (primary) hypertension: Secondary | ICD-10-CM | POA: Diagnosis not present

## 2023-10-27 DIAGNOSIS — H538 Other visual disturbances: Secondary | ICD-10-CM | POA: Insufficient documentation

## 2023-10-27 LAB — CBC WITH DIFFERENTIAL/PLATELET
Abs Immature Granulocytes: 0.02 K/uL (ref 0.00–0.07)
Basophils Absolute: 0 K/uL (ref 0.0–0.1)
Basophils Relative: 1 %
Eosinophils Absolute: 0.1 K/uL (ref 0.0–0.5)
Eosinophils Relative: 2 %
HCT: 40 % (ref 36.0–46.0)
Hemoglobin: 12.7 g/dL (ref 12.0–15.0)
Immature Granulocytes: 0 %
Lymphocytes Relative: 42 %
Lymphs Abs: 2 K/uL (ref 0.7–4.0)
MCH: 28.7 pg (ref 26.0–34.0)
MCHC: 31.8 g/dL (ref 30.0–36.0)
MCV: 90.5 fL (ref 80.0–100.0)
Monocytes Absolute: 0.6 K/uL (ref 0.1–1.0)
Monocytes Relative: 14 %
Neutro Abs: 1.9 K/uL (ref 1.7–7.7)
Neutrophils Relative %: 41 %
Platelets: 258 K/uL (ref 150–400)
RBC: 4.42 MIL/uL (ref 3.87–5.11)
RDW: 13.5 % (ref 11.5–15.5)
WBC: 4.7 K/uL (ref 4.0–10.5)
nRBC: 0 % (ref 0.0–0.2)

## 2023-10-27 LAB — COMPREHENSIVE METABOLIC PANEL WITH GFR
ALT: 17 U/L (ref 0–44)
AST: 22 U/L (ref 15–41)
Albumin: 3.5 g/dL (ref 3.5–5.0)
Alkaline Phosphatase: 52 U/L (ref 38–126)
Anion gap: 8 (ref 5–15)
BUN: 7 mg/dL (ref 6–20)
CO2: 27 mmol/L (ref 22–32)
Calcium: 8.8 mg/dL — ABNORMAL LOW (ref 8.9–10.3)
Chloride: 105 mmol/L (ref 98–111)
Creatinine, Ser: 0.74 mg/dL (ref 0.44–1.00)
GFR, Estimated: >60 mL/min (ref >60)
Glucose, Bld: 108 mg/dL — ABNORMAL HIGH (ref 70–99)
Potassium: 4.1 mmol/L (ref 3.5–5.1)
Sodium: 140 mmol/L (ref 135–145)
Total Bilirubin: 0.5 mg/dL (ref 0.0–1.2)
Total Protein: 7 g/dL (ref 6.5–8.1)

## 2023-10-27 LAB — CBG MONITORING, ED: Glucose-Capillary: 108 mg/dL — ABNORMAL HIGH (ref 70–99)

## 2023-10-27 LAB — I-STAT CHEM 8, ED
BUN: 6 mg/dL (ref 6–20)
Calcium, Ion: 1.21 mmol/L (ref 1.15–1.40)
Chloride: 101 mmol/L (ref 98–111)
Creatinine, Ser: 0.7 mg/dL (ref 0.44–1.00)
Glucose, Bld: 107 mg/dL — ABNORMAL HIGH (ref 70–99)
HCT: 40 % (ref 36.0–46.0)
Hemoglobin: 13.6 g/dL (ref 12.0–15.0)
Potassium: 4.3 mmol/L (ref 3.5–5.1)
Sodium: 140 mmol/L (ref 135–145)
TCO2: 27 mmol/L (ref 22–32)

## 2023-10-27 LAB — TSH: TSH: 1.192 u[IU]/mL (ref 0.350–4.500)

## 2023-10-27 MED ORDER — IOHEXOL 350 MG/ML SOLN
75.0000 mL | Freq: Once | INTRAVENOUS | Status: AC | PRN
  Administered 2023-10-27: 75 mL via INTRAVENOUS

## 2023-10-27 NOTE — Telephone Encounter (Unsigned)
 Copied from CRM [id number]. Topic: Clinical - Red Word Triage >> Oct 27, 2023 10:50 AM Shelby Rios wrote: Red Word that prompted transfer to Nurse Triage: vision blurred  Pt stated her left eye is blurry and right eye is blurry when looking down. Pt stated that she has used eyedrops and issue persists. Pt would like to speak with a nurse for medical advice. >> Oct 27, 2023 11:35 AM Shelby Rios wrote: Pt called back to advise she is on her way to the ED.

## 2023-10-27 NOTE — Telephone Encounter (Signed)
 FYI Only or Action Required?: FYI only for provider.  Patient was last seen in primary care on 01/06/2023 by Nche, Roselie Rockford, NP. Called Nurse Triage reporting Blurred Vision. Symptoms began today. Interventions attempted: Nothing. Symptoms are: unchanged.  Triage Disposition: Go to ED or PCP/Alternative with Approval  Patient/caregiver understands and will follow disposition?: YesCopied from CRM [id number]. Topic: Clinical - Red Word Triage >> Oct 27, 2023 10:50 AM [name] wrote: Red Word that prompted transfer to Nurse Triage: vision blurred  Pt stated her left eye is blurry and right eye is blurry when looking down. Pt stated that she has used eyedrops and issue persists. Pt would like to speak with a nurse for medical advice. Reason for Disposition  [1] Blurred vision or visual changes AND [2] present now AND [3] sudden onset or new (e.g., minutes, hours, days)  (Exception: Seeing floaters / black specks OR previously diagnosed migraine headaches with same symptoms.)  Answer Assessment - Initial Assessment Questions 1. DESCRIPTION: How has your vision changed? (e.g., complete vision loss, blurred vision, double vision, floaters, etc.)     Blurred vision 2. LOCATION: One or both eyes? If one, ask: Which eye?     Both eyes 3. SEVERITY: Can you see anything? If Yes, ask: What can you see? (e.g., fine print)     yes 4. ONSET: When did this begin? Did it start suddenly or has this been gradual?     Today-suddenly  5. PATTERN: Does this come and go, or has it been constant since it started?     Constant  6. PAIN: Is there any pain in your eye(s)?  (Scale 1-10; or mild, moderate, severe)   - NONE (0): No pain.   - MILD (1-3): Doesn't interfere with normal activities.   - MODERATE (4-7): Interferes with normal activities or awakens from sleep.    - SEVERE (8-10): Excruciating pain, unable to do any normal activities.     Mild-  7. CONTACTS-GLASSES: Do you wear contacts or  glasses?     glasses 8. CAUSE: What do you think is causing this visual problem?     No sure  9. OTHER SYMPTOMS: Do you have any other symptoms? (e.g., confusion, headache, arm or leg weakness, speech problems)     Denies    Pt woke up normal and doing ADL's until starting working. Pt thought glasses were scratched/dirty. Pt has spot in left eye she can't see out of and right eye is blurry. Symptoms are not going away. Pt has family hx of stroke and is concerned. Pt has tried eye drops and making sure nothing is in eye. Pt can't get into eye dr until tomorrow.  Pt had brief pain behind left ear.  Pt had lasix surgery 4 years ago and wears readers. Daughter is taking pt to ED.  Protocols used: Vision Loss or Change-A-AH

## 2023-10-27 NOTE — ED Notes (Signed)
 PT transported to vascular

## 2023-10-27 NOTE — Progress Notes (Signed)
 RLE venous duplex has been completed.  Preliminary results given to [facility], PA-C.   Results can be found under chart review under CV PROC. 10/27/2023 3:43 PM Daymien Goth RVT, RDMS

## 2023-10-27 NOTE — ED Provider Notes (Signed)
 Physical Exam   Vitals:   10/27/23 1213 10/27/23 1312 10/27/23 1500 10/27/23 1717  BP:   (!) 113/97 120/69  Pulse:   71 69  Resp:   14 18  Temp:  98 F (36.7 C)  98.2 F (36.8 C)  TempSrc:  Oral    SpO2:   100% 100%  Weight: 81.6 kg     Height: 5' 6 (1.676 m)        Physical Exam Vitals and nursing note reviewed.  HENT:     Head: Normocephalic.  Eyes:     General: Vision grossly intact.        Right eye: No discharge.        Left eye: No discharge.     Extraocular Movements: Extraocular movements intact.     Conjunctiva/sclera:     Right eye: Right conjunctiva is not injected.     Left eye: Left conjunctiva is not injected.     Pupils: Pupils are equal, round, and reactive to light.     Comments: Limited view with ophthalmoscope, no obvious retinal detachment visualized   Cardiovascular:     Rate and Rhythm: Normal rate.  Pulmonary:     Effort: Pulmonary effort is normal.  Musculoskeletal:     Cervical back: Normal range of motion.     Comments: 5 out of 5 strength against resistance of bilateral upper and lower extremities  Skin:    General: Skin is warm and dry.  Neurological:     Mental Status: She is alert and oriented to person, place, and time.     Sensory: No sensory deficit.     Motor: No weakness.     Procedures  Procedures  ED Course / MDM    Medical Decision Making Amount and/or Complexity of Data Reviewed Radiology: ordered.   Patient received at shift change from prior [facility]. See their note for initial history, physical exam findings, medication management, initial assessment/plan.  Patient presenting to the ER with blurry vision in her left eye that began suddenly this morning, patient also had short episode of paresthesias on her left side which has since resolved.  See triage note from Dr. Tegeler for neurology recommendations (Dr. Jerrie).  Pending CTA head/neck and CT venogram results, anticipate need for additional  imaging with MRI.  At time of my reassessment, patient was still having blurry vision, I checked her visual acuity without glasses, she is 20/20 OD and 20/30 OS.  She is looking around quite a bit with her left eye in order to clear my vision.  She denies ocular pain, no pain with EOMs, denies curtain/veil over vision.  Based on these findings I have low suspicion for things like retinal detachment or acute angle-closure glaucoma.  CT results:  - CT venogram: Negative - CT a head/neck: 1. Minimal atheromatous disease involving the right internal carotid artery without evidence of flow-limiting stenosis. Otherwise negative CT angiogram of the neck. 2. Normal CT angiogram of the head. - DVT study: negative  MRI: - No acute intracranial abnormality.  - Punctate foci of signal abnormality in the subcortical white matter primarily within the frontal lobes. Findings may reflect sequelae of chronic migraine headaches, mild chronic microvascular ischemic changes, or possibly sequelae of prior infection/inflammatory process.   Results reviewed with patient, she does endorse history of chronic migraines.  She is scheduled to see her ophthalmologist/optometrist tomorrow afternoon, I encouraged her to keep this appointment, she is in agreement with this plan.  Strict  return precautions discussed, she is appropriate for discharge at this time.       Glendia Rocky SAILOR, [place] 10/27/23 1907    Elnor Jayson LABOR, DO 10/30/23 1432

## 2023-10-27 NOTE — Discharge Instructions (Signed)
 Keep your scheduled appointment with your ophthalmologist/optometrist tomorrow for further assessment of your blurry vision. Return to the emergency department if your symptoms worsen or if you develop eye pain. Follow up with your PCP later this week.

## 2023-10-27 NOTE — ED Triage Notes (Signed)
 Pt c/o blurred vision in left eye that started at 0817 this morning. Pt is unsure if it may have been earlier, but noticed it for sure at that time. Pt has decreased sensation in left arm and left leg.

## 2023-10-27 NOTE — ED Provider Notes (Signed)
 [place] EMERGENCY DEPARTMENT AT [facility] Provider Note   CSN: [id number] Arrival date & time: 10/27/23  1157     Patient presents with: Blurred Vision   Shelby Rios is a 48 y.o. female with PMHx migraines, HLD, HTN, GERD, seizures who presents to ED concerned for left eye blurring. Patient stating that she was going about her normal morning routine when left eye became blurry around 8:17AM. Patient called her ophthalmologist who was not able to see her until tomorrow.  Patient then called her PCP and the nurse referred her to the ED.  Patient denies abnormal gait or any weakness.  Patient endorsing short-lived episode of paresthesias and left hand/leg during neuro exam in triage.  Patient denies vision loss, but does endorse that left eye seems to be a little duller in color.   Of note, patient also noting right calf pain after long car trip last week. Patient also noting that she started estradiol  last month.  Denies fever, chest pain, dyspnea, cough, nausea, vomiting, diarrhea, dysuria, hematuria, hematochezia.    HPI     Prior to Admission medications   Medication Sig Start Date End Date Taking? Authorizing Provider  buPROPion  (WELLBUTRIN  XL) 300 MG 24 hr tablet Take 1 tablet (300 mg total) by mouth daily. 10/06/23   Arfeen, Leni ONEIDA, MD  cholecalciferol (VITAMIN D3) 25 MCG (1000 UNIT) tablet Take 1,000 Units by mouth daily.    [provider]  estradiol  (VIVELLE -DOT) 0.025 MG/24HR PLACE 1 PATCH ONTO THE SKIN 2 TIMES A WEEK. 10/20/23   Prentiss Riggs A, NP  ibuprofen (ADVIL) 200 MG tablet Take 200 mg by mouth as needed.    [provider]  loratadine (CLARITIN) 10 MG tablet Take 10 mg by mouth daily.    [provider]  MAGNESIUM PO Take 250 mg by mouth.    [provider]  multivitamin-iron-minerals-folic acid (CENTRUM) chewable tablet Chew 1 tablet by mouth daily.    [provider]  pantoprazole  (PROTONIX ) 20 MG tablet  TAKE 1 TABLET BY MOUTH DAILY AS  NEEDED 10/07/23   Nche, Roselie Rockford, NP  Semaglutide -Weight Management (WEGOVY ) 2.4 MG/0.75ML SOAJ Inject 2.4 mg into the skin. Provided by [facility]    [provider]  vitamin B-12 (CYANOCOBALAMIN ) 100 MCG tablet Take 100 mcg by mouth daily.    [provider]    Allergies: Patient has no known allergies.    Review of Systems  Eyes:  Positive for visual disturbance.    Updated Vital Signs BP (!) 126/94 (BP Location: Right Arm)   Pulse 73   Temp 98 F (36.7 C) (Oral)   Resp 16   Ht 5' 6 (1.676 m)   Wt 81.6 kg   LMP 07/22/2006   SpO2 100%   BMI 29.05 kg/m   Physical Exam Vitals and nursing note reviewed.  Constitutional:      General: She is not in acute distress.    Appearance: She is not ill-appearing or toxic-appearing.  HENT:     Head: Normocephalic and atraumatic.     Mouth/Throat:     Mouth: Mucous membranes are moist.  Eyes:     General: No scleral icterus.       Right eye: No discharge.        Left eye: No discharge.     Conjunctiva/sclera: Conjunctivae normal.  Cardiovascular:     Rate and Rhythm: Normal rate and regular rhythm.     Pulses: Normal pulses.  Heart sounds: Normal heart sounds. No murmur heard. Pulmonary:     Effort: Pulmonary effort is normal. No respiratory distress.     Breath sounds: Normal breath sounds. No wheezing, rhonchi or rales.  Abdominal:     General: Abdomen is flat. There is no distension.     Palpations: There is no mass.     Tenderness: There is no abdominal tenderness.  Musculoskeletal:     Right lower leg: No edema.     Left lower leg: No edema.  Skin:    General: Skin is warm and dry.     Findings: No rash.  Neurological:     General: No focal deficit present.     Mental Status: She is alert and oriented to person, place, and time. Mental status is at baseline.     Comments: GCS 15. Speech is goal oriented. No deficits appreciated to CN III-XII;  symmetric eyebrow raise, no facial drooping, tongue midline. Patient has equal grip strength bilaterally with 5/5 strength against resistance in all major muscle groups bilaterally. Sensation to light touch intact. Patient moves extremities without ataxia.  Visual fields intact.   Psychiatric:        Mood and Affect: Mood normal.        Behavior: Behavior normal.     (all labs ordered are listed, but only abnormal results are displayed) Labs Reviewed  COMPREHENSIVE METABOLIC PANEL WITH GFR - Abnormal; Notable for the following components:      Result Value   Glucose, Bld 108 (*)    Calcium 8.8 (*)    All other components within normal limits  I-STAT CHEM 8, ED - Abnormal; Notable for the following components:   Glucose, Bld 107 (*)    All other components within normal limits  CBG MONITORING, ED - Abnormal; Notable for the following components:   Glucose-Capillary 108 (*)    All other components within normal limits  CBC WITH DIFFERENTIAL/PLATELET  TSH    EKG: None  Radiology: No results found.   Procedures   Medications Ordered in the ED  iohexol  (OMNIPAQUE ) 350 MG/ML injection 75 mL (75 mLs Intravenous Contrast Given 10/27/23 1412)                                    Medical Decision Making  This patient presents to the ED for concern of AMS, this involves an extensive number of treatment options, and is a complaint that carries with it a high risk of complications and morbidity.  The differential diagnosis includes CVA, ICH, intracranial mass, critical dehydration, heptatic dysfunction, uremia, hypercarbia, intoxication/withdrawal, endocrine abnormality, sepsis/infection.   Co morbidities that complicate the patient evaluation  migraines, HLD, HTN, GERD, seizures   Additional history obtained:  [name]   Problem List / ED Course / Critical interventions / Medication management  Patient presented for left eye blurred vision.  Patient also with short  episode of paresthesias in the left arm/leg during triage.  Physical/neuroexam reassuring.  Patient afebrile with stable vitals. I Ordered, and personally interpreted labs.  CBC without leukocytosis or anemia.  CMP with mild hyperglycemia at 108.  I personally viewed and interpreted the EKG/cardiac monitored which showed an underlying rhythm of: Sinus rhythm with no acute changes. I ordered imaging studies including CT venogram/angio head. These imaging studies are pending. I have reviewed the patients home medicines and have made adjustments as needed   Social  Determinants of Health:  none  3PM Care of KINDA POTTLE transferred to [facility] at the end of my shift as the patient will require reassessment once labs/imaging have resulted. Patient presentation, ED course, and plan of care discussed with review of all pertinent labs and imaging. Please see his/her note for further details regarding further ED course and disposition. Plan at time of handoff is reassess patient after imaging. Patient will need MRI if CT scans are not conclusive. This may be altered or completely changed at the discretion of the oncoming team pending results of further workup.       Final diagnoses:  None    ED Discharge Orders     None          Hoy Nidia FALCON, [place] 10/27/23 1513    Levander [place], MD 10/28/23 [phone]

## 2023-10-27 NOTE — ED Provider Triage Note (Signed)
 Emergency Medicine Provider Triage Evaluation Note  Shelby Rios , a 48 y.o. female  was evaluated in triage.  Pt complains of left eye vision change and left sided sensory deficit.  Review of Systems  Positive: Abnormal sensation, temporary left neck discomfort, left eye vision abnormality Negative: Headache, trauma, speech change, dizziness, weakness, chest pain, palpitations, shortness of breath, nausea, vomiting  Physical Exam  BP (!) 126/94 (BP Location: Right Arm)   Pulse 73   Temp 98 F (36.7 C) (Oral)   Resp 16   Ht 5' 6 (1.676 m)   Wt 81.6 kg   LMP 07/22/2006   SpO2 100%   BMI 29.05 kg/m  Gen:   Awake, no distress   Resp:  Normal effort  MSK:   Moves extremities without difficulty Other:  Abnormal sensation with numbness in left face, left arm, and left leg.  Intact strength in arms and legs and symmetric smile.  Pupil symmetric and reactive.  Clear speech.  Normal finger-nose-finger testing.  No carotid bruit on my exam.  Medical Decision Making  Medically screening exam initiated at 12:38 PM.  Appropriate orders placed.  Shelby Rios was informed that the remainder of the evaluation will be completed by another provider, this initial triage assessment does not replace that evaluation, and the importance of remaining in the ED until their evaluation is complete.   Shelby Rios is a 48 y.o. female with a past medical history significant for previous seizures, migraines, sleep apnea, anemia, hypertension, depression, and endometriosis who presents with neurological plaint.  Cording to patient, she felt she was normal eating this morning when she was on a treadmill, got coffee, and wrote in a journal however at 817 in the morning, she noticed a vision change.  She reports her left eye central vision was blurry and looking more washed out.  She is never had this before.  She then noticed some numbness sensation in her left side and presents for evaluation.  Patient denies any  headaches but she does report that briefly for about 15 to 20 minutes earlier she had some discomfort in her left neck going behind her left ear.  No pain now.  Denies any headache.  She reports she did do start estrogen for low estrogen levels.  She has had no history of clots or dural venous sinus thrombus or vascular problems in the past she reports.  She denies any chest pain palpitations or shortness of breath.  Denies other infectious symptoms.  Denies any nausea, vomiting, constipation, diarrhea, urinary changes.  I was asked to come evaluate patient in triage.  On my exam, lungs clear.  Chest nontender.  Abdomen tender.  She indeed has numbness in her left face, left arm, and left leg.  There is no droop.  Metric strength.  Symmetric strength in legs.  Speech was clear.  Pupils were symmetric and reactive with normal extraocular movements.  I did not appreciate a carotid bruit.  Normal finger-nose-finger testing.  Patient has the left eyes vision change but did not have visual field cut on my exam and then the numbness.  I called and spoke to Dr. Jerrie with neurology initially to see if this patient met criteria for code stroke activation and she did not feel this.  She recommended CTA head and neck, CT venogram of the head, and other suspected stroke workup.  Anticipate she will likely need MRI.  She will be given a room soon as possible for further  evaluation and workup.    Navi Erber, Lonni PARAS, MD 10/27/23 1242

## 2023-10-27 NOTE — Telephone Encounter (Signed)
 I called patient and left a message to check if she was going to the ED for further care per Nurse Triage message.

## 2023-10-27 NOTE — ED Triage Notes (Signed)
 Pt to er, pt states that she is here for some blurry vision, states that her last know well was last night, states that she noticed the blurry vision when she was looking at something, no facial droop, no slurred speech, equal strength bilaterally, pt denies pain.

## 2023-10-28 ENCOUNTER — Ambulatory Visit: Admitting: Nurse Practitioner

## 2023-10-28 ENCOUNTER — Encounter: Payer: Self-pay | Admitting: Nurse Practitioner

## 2023-10-28 VITALS — BP 120/78 | HR 72 | Ht 67.0 in | Wt 180.0 lb

## 2023-10-28 DIAGNOSIS — R5383 Other fatigue: Secondary | ICD-10-CM

## 2023-10-28 DIAGNOSIS — R4189 Other symptoms and signs involving cognitive functions and awareness: Secondary | ICD-10-CM | POA: Diagnosis not present

## 2023-10-28 DIAGNOSIS — L659 Nonscarring hair loss, unspecified: Secondary | ICD-10-CM | POA: Diagnosis not present

## 2023-10-28 DIAGNOSIS — N951 Menopausal and female climacteric states: Secondary | ICD-10-CM | POA: Diagnosis not present

## 2023-10-28 NOTE — Progress Notes (Signed)
   Acute Office Visit  Subjective:    Patient ID: DIANEY SUCHY, female    DOB: 02-26-1976, 48 y.o.   MRN: [id number]   HPI 48 y.o. presents today for 4-week med follow up. Started ERT for management of menopausal symptoms. Has been experiencing night sweats, fatigue, brain fog, joint pain, hair loss, GI issues, breast tenderness, decreased libido. Brain fog is most bothersome and affecting her work. Does feel hair loss has slowed down and is seeing new growth. Started Mag L-threonate, estradiol  patch 0.25 mg. Already taking Vit D, B12, magnesium. S/P [facility] 2008 for endometriosis. Normal TSH, Vit D, CBC, CMP. Elevated B12, estradiol  44, FSH 30. Went to ER yesterday for blurred vision, right calf pain after long car ride. Neg CT, MRI (showed chronic migraine changes), negative DVT study. Mother had stoke at age 58. Seeing eye specialist today.   Patient's last menstrual period was 07/22/2006.    Review of Systems  Constitutional:  Positive for fatigue.  Endocrine: Positive for heat intolerance.  Genitourinary:        Decreased libido  Musculoskeletal:  Positive for arthralgias.  Skin:        Hair loss  Psychiatric/Behavioral:  Positive for decreased concentration.        Objective:    Physical Exam Constitutional:      Appearance: Normal appearance.     BP 120/78 (BP Location: Right Arm, Patient Position: Sitting, Cuff Size: Normal)   Pulse 72   Ht 5' 7 (1.702 m)   Wt 180 lb (81.6 kg)   LMP 07/22/2006   SpO2 100%   BMI 28.19 kg/m  Wt Readings from Last 3 Encounters:  10/28/23 180 lb (81.6 kg)  10/27/23 180 lb (81.6 kg)  09/29/23 181 lb (82.1 kg)        Assessment & Plan:   Problem List Items Addressed This Visit   None Visit Diagnoses       Perimenopausal symptoms    -  Primary     Fatigue, unspecified type         Hair loss         Brain fog          Plan: Reviewed ER visit. Discussed slight increased risk for blood clots with ERT. Will determine plan after  she sees eye specialist. Continue supplements and estradiol  patch for now. Briefly discussed alternatives such a progesterone, Veozah for symptom management if appropriate.     Annabella DELENA Shutter DNP, 10:07 AM 10/28/2023

## 2023-10-29 ENCOUNTER — Ambulatory Visit (HOSPITAL_COMMUNITY): Admitting: Licensed Clinical Social Worker

## 2023-10-29 NOTE — Progress Notes (Addendum)
 NEUROLOGY CONSULTATION NOTE  Shelby Rios MRN: [id number] DOB: 02/12/1976  Referring provider: Rocky Mace, OD Primary care provider: Roselie Bishop Mood, NP  Reason for consult:  left eye visual disturbance  Assessment/Plan:   Left eye papilledema - Consider idiopathic intracranial hypertension > optic neuritis.  Unusual presentation for IIH as it is monocular.  Unusual presentation for optic neuritis as she is without pain.  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  She endorses very mild sensation loss in left side (face, arm, leg) for which I do not have an explanation at this time.  No findings on brain MRI to explain symptoms.    Will check urgent MRI of orbits with and without contrast Will perform lumbar puncture after MRI.  Check opening pressure as well as CSF analysis for consider cell count, protein, glucose, gram stain/culture, cytology, and (pending MRI results) oligoclonal bands and IgG index Remain off estradiol . Further recommendations pending results.   Subjective:  Shelby Rios is a 48 year old right-handed female with depression, anxiety, migraines and remote history of seizures in adolescence who presents for visual disturbance in left eye.  History supplemented by ED and referring provider's note.  On 7/7, she woke up and started her morning routine.  When she sat down to work, she noticed her vision wasn't clear.  After further testing, she realized that she had central blurred vision in her left eye.  She also noticed that everything was washed out looking out of the left eye.  She denied headache, ocular pain or photosensitivity.  She has prior history of migraines with blurred vision in the right eye, but this was different and she rarely has a migraine now.  The only recent change was that she started estradiol  a month ago to treat perimenopause.    She went to the ED at [facility].  MRI of brain without contrast  showed minimal nonspecific T2/FLAIR white matter hyperintensities but no acute findings.  CTV head and CTA head and neck were unremarkable. She has chronic right leg pain, so they performed a venous doppler which was negative for DVT.  She saw her optometrist the next day.  Visual acuity was good but she did demonstrate papilledema in the left eye.    Since then, there has been no change in her vision.  She continues to deny headache or ocular pain.  Of note, her mother had a stroke at age 30.     PAST MEDICAL HISTORY: Past Medical History:  Diagnosis Date   Abnormal mammogram, unspecified    Abnormal Pap smear of cervix    Abnormal weight gain    Allergy    Anemia    Anxiety    Back pain    Breast pain, right    Depression    Dyspepsia    Edema    Endometriosis    Fatigue    GERD (gastroesophageal reflux disease)    H. pylori infection    HSV-1 infection    oral   Hypertension    only during pregnancy   Migraines    Nonspecific abnormal results of other specified function study    Obesity    Palpitations    Routine general medical examination at a health care facility    Seizures [facility])    couple seisure around age 17- tx with dilantin for a period then d/c   Sleep apnea    c-pap wears   Vitamin B12 deficiency 11/02/2014  PAST SURGICAL HISTORY: Past Surgical History:  Procedure Laterality Date   adenoidectomy     BRCA test     neg   DILATION AND CURETTAGE OF UTERUS     PARTIAL HYSTERECTOMY  04/22/2006   endometriosis, done vaginally, she has her ovaries   TONSILLECTOMY     WISDOM TOOTH EXTRACTION      MEDICATIONS: Current Outpatient Medications on File Prior to Visit  Medication Sig Dispense Refill   buPROPion  (WELLBUTRIN  XL) 300 MG 24 hr tablet Take 1 tablet (300 mg total) by mouth daily. 90 tablet 0   cholecalciferol (VITAMIN D3) 25 MCG (1000 UNIT) tablet Take 1,000 Units by mouth daily.     estradiol  (VIVELLE -DOT) 0.025 MG/24HR PLACE 1 PATCH ONTO THE  SKIN 2 TIMES A WEEK. 24 patch 0   ibuprofen (ADVIL) 200 MG tablet Take 200 mg by mouth as needed.     loratadine (CLARITIN) 10 MG tablet Take 10 mg by mouth daily.     MAGNESIUM PO Take 250 mg by mouth.     multivitamin-iron-minerals-folic acid (CENTRUM) chewable tablet Chew 1 tablet by mouth daily.     pantoprazole  (PROTONIX ) 20 MG tablet TAKE 1 TABLET BY MOUTH DAILY AS  NEEDED 90 tablet 1   Semaglutide -Weight Management (WEGOVY ) 2.4 MG/0.75ML SOAJ Inject 2.4 mg into the skin. Provided by [facility]     vitamin B-12 (CYANOCOBALAMIN ) 100 MCG tablet Take 100 mcg by mouth daily.     No current facility-administered medications on file prior to visit.    ALLERGIES: No Known Allergies  FAMILY HISTORY: Family History  Problem Relation Age of Onset   Hyperlipidemia Mother    Ovarian cancer Mother    Stroke Mother 47   Diabetes Mother    Seizures Mother    Depression Mother    Hypertension Mother    Obesity Mother    Colon polyps Mother    Irritable bowel syndrome Mother    Hyperlipidemia Father    Heart disease Father        CABG age 25   Diabetes Father    Depression Father    Early death Father    Hypertension Father    Obesity Father    Hypokalemia Sister        deceased at 51; was getting regular IV K infusions; long Hx of pseudotumor cerebri   Early death Sister    Breast cancer Maternal Grandmother    Heart attack Maternal Grandfather    Heart attack Paternal Grandfather     Objective:  Blood pressure (!) 111/52, pulse 74, height 5' 6 (1.676 m), weight 182 lb (82.6 kg), last menstrual period 07/22/2006, SpO2 98%. General: No acute distress.  Patient appears well-groomed.   Head:  Normocephalic/atraumatic Eyes:  fundi examined but not visualized Neck: supple, no paraspinal tenderness, full range of motion Heart: regular rate and rhythm Neurological Exam: Mental status: alert and oriented to person, place, and time, speech fluent and not dysarthric,  language intact. Cranial nerves: CN I: not tested CN II: pupils equal, round and reactive to light, visual fields intact CN III, IV, VI:  full range of motion, no nystagmus, no ptosis CN V: Endorses trace dull sensation in left V1-V3 CN VII: upper and lower face symmetric CN VIII: hearing intact CN IX, X: gag intact, uvula midline CN XI: sternocleidomastoid and trapezius muscles intact CN XII: tongue midline Bulk & Tone: normal, no fasciculations. Motor:  muscle strength 5/5 throughout Sensation:  Endorses trace dulling sensation of  left arm and leg; trace reduced vibratory sensation in left foot. Deep Tendon Reflexes:  2+ throughout,  toes downgoing.   Finger to nose testing:  Without dysmetria.   Heel to shin:  Without dysmetria.   Gait:  Normal station and stride.  Romberg negative.    Thank you for allowing me to take part in the care of this patient.  Juliene Dunnings, DO  CC:  Roselie Bishop Mood, NP  Rocky Mace, OD

## 2023-10-30 ENCOUNTER — Encounter: Payer: Self-pay | Admitting: Neurology

## 2023-10-30 ENCOUNTER — Ambulatory Visit: Admitting: Neurology

## 2023-10-30 VITALS — BP 111/52 | HR 74 | Ht 66.0 in | Wt 182.0 lb

## 2023-10-30 DIAGNOSIS — H4710 Unspecified papilledema: Secondary | ICD-10-CM

## 2023-10-30 NOTE — Patient Instructions (Signed)
 MRI of orbits with and without contrast Following MRI, schedule spinal tap Further recommendations pending results Follow up 3 months.

## 2023-11-03 ENCOUNTER — Encounter: Payer: Self-pay | Admitting: Neurology

## 2023-11-03 DIAGNOSIS — H4710 Unspecified papilledema: Secondary | ICD-10-CM

## 2023-11-05 ENCOUNTER — Telehealth: Payer: Self-pay

## 2023-11-05 DIAGNOSIS — H4710 Unspecified papilledema: Secondary | ICD-10-CM

## 2023-11-05 NOTE — Telephone Encounter (Signed)
 NO PA NEEDED FOR [id number]

## 2023-11-05 NOTE — Addendum Note (Signed)
 Addended by: OZELL JESUSA PARAS on: 11/05/2023 08:28 AM   Modules accepted: Orders

## 2023-11-05 NOTE — Telephone Encounter (Signed)
 Appt scheduled at 7/23 at 9 am arrive at 8:30 am at [facility] ent. A.   Patient states the top quadrant of her left eye is still completely gone. But she can still see some.    -Patient goes out of town Aug.5 on a plane is okay to go after having a LP.

## 2023-11-11 ENCOUNTER — Encounter: Payer: Self-pay | Admitting: Family Medicine

## 2023-11-11 ENCOUNTER — Ambulatory Visit: Admitting: Nurse Practitioner

## 2023-11-11 ENCOUNTER — Ambulatory Visit: Admitting: Family Medicine

## 2023-11-11 VITALS — BP 120/80 | HR 69 | Temp 98.0°F | Ht 66.0 in | Wt 180.0 lb

## 2023-11-11 DIAGNOSIS — M545 Low back pain, unspecified: Secondary | ICD-10-CM

## 2023-11-11 MED ORDER — NAPROXEN 500 MG PO TABS: 500.0000 mg | ORAL_TABLET | Freq: Two times a day (BID) | ORAL | 0 refills | Status: DC

## 2023-11-11 MED ORDER — HYDROCODONE-ACETAMINOPHEN 10-325 MG PO TABS: 1.0000 | ORAL_TABLET | Freq: Three times a day (TID) | ORAL | 0 refills | Status: DC | PRN

## 2023-11-11 MED ORDER — CYCLOBENZAPRINE HCL 10 MG PO TABS: 10.0000 mg | ORAL_TABLET | Freq: Three times a day (TID) | ORAL | 0 refills | Status: DC | PRN

## 2023-11-11 NOTE — Progress Notes (Signed)
 [facility] [address] [place] [address] Dept: [phone] Dept Fax: [phone]  Office Visit  Subjective:    Patient ID: Shelby Rios, female    DOB: March 23, 1976, 48 y.o..   MRN: [id number]  Chief Complaint  Patient presents with   Back Pain    Yesterday bent down to pick up dog and felt something pop in back, lower going into hip going down top of leg on Left side; trying tylenol  and ibuprofen, icey hot patches but nothing easing pain; pain level (10/10); due to for spinal tap tomorrow   History of Present Illness:  Patient is in today for complaining of acute onset of left lower back pina yesterday. Shelby Rios notes she had ben over to pick up a small dog, when she either heard or felt a pop in her left lower back. This caused sudden pain. She found it difficult to ambulate back into her house. Since then she has been taking ibuprofen 600 mg about every 6 hours, has a Salonpas patch on her left lower back, and has used ice. She denies any new areas of numbness, tingling, burning sensation, or weakness of the lower extremities.  Shelby Rios is currently undergoing neurology work-up related to an acute onset of a left visual loss. She was found to have papilledema. She is scheduled for a lumbar puncture tomorrow, as she is being assessed for idiopathic intracranial hypertension.  Past Medical History: Patient Active Problem List   Diagnosis Date Noted   Hot flashes 01/06/2023   Recurrent major depressive disorder, in partial remission (HCC) 07/05/2022   HTN (hypertension), benign 01/03/2022   Family history of premature CAD 01/01/2021   History of endometriosis 01/01/2021   Family history of ovarian cancer 01/01/2021   Pelvic pain 01/01/2021   Hypercholesteremia 01/01/2021   Migraines 12/21/2018   Chronic GERD 12/20/2018   S/P hysterectomy 10/02/2016   OSA (obstructive sleep apnea) 11/06/2015   Other fatigue 09/19/2015    Vitamin D  deficiency 11/02/2014   PALPITATIONS 08/09/2009   Obesity 01/19/2009   Depression 06/03/2007   Past Surgical History:  Procedure Laterality Date   adenoidectomy     BRCA test     neg   DILATION AND CURETTAGE OF UTERUS     PARTIAL HYSTERECTOMY  04/22/2006   endometriosis, done vaginally, she has her ovaries   TONSILLECTOMY     WISDOM TOOTH EXTRACTION     Family History  Problem Relation Age of Onset   Migraines Mother    Hyperlipidemia Mother    Ovarian cancer Mother    Stroke Mother 80   Diabetes Mother    Seizures Mother    Depression Mother    Hypertension Mother    Obesity Mother    Colon polyps Mother    Irritable bowel syndrome Mother    Hyperlipidemia Father    Heart disease Father        CABG age 4   Diabetes Father    Depression Father    Early death Father    Hypertension Father    Obesity Father    Hypokalemia Sister        deceased at 86; was getting regular IV K infusions; long Hx of pseudotumor cerebri   Early death Sister    Migraines Maternal Aunt    Migraines Maternal Uncle    Breast cancer Maternal Grandmother    Heart attack Maternal Grandfather    Heart attack Paternal Grandfather    Outpatient  Medications Prior to Visit  Medication Sig Dispense Refill   buPROPion  (WELLBUTRIN  XL) 300 MG 24 hr tablet Take 1 tablet (300 mg total) by mouth daily. 90 tablet 0   cholecalciferol (VITAMIN D3) 25 MCG (1000 UNIT) tablet Take 1,000 Units by mouth daily.     estradiol  (VIVELLE -DOT) 0.025 MG/24HR PLACE 1 PATCH ONTO THE SKIN 2 TIMES A WEEK. 24 patch 0   ibuprofen (ADVIL) 200 MG tablet Take 200 mg by mouth as needed.     loratadine (CLARITIN) 10 MG tablet Take 10 mg by mouth daily.     MAGNESIUM PO Take 250 mg by mouth.     multivitamin-iron-minerals-folic acid (CENTRUM) chewable tablet Chew 1 tablet by mouth daily.     pantoprazole  (PROTONIX ) 20 MG tablet TAKE 1 TABLET BY MOUTH DAILY AS  NEEDED 90 tablet 1   Semaglutide -Weight Management  (WEGOVY ) 2.4 MG/0.75ML SOAJ Inject 2.4 mg into the skin. Provided by [facility]     vitamin B-12 (CYANOCOBALAMIN ) 100 MCG tablet Take 100 mcg by mouth daily.     No facility-administered medications prior to visit.   No Known Allergies   Objective:   Today's Vitals   11/11/23 0930  BP: 120/80  Pulse: 69  Temp: 98 F (36.7 C)  SpO2: 99%  Weight: 180 lb (81.6 kg)  Height: 5' 6 (1.676 m)  PainSc: 10-Worst pain ever  PainLoc: Back   Body mass index is 29.05 kg/m.   General: Well developed, well nourished. No acute distress. Back: Straight. Pain indicated over the left paralumbar muscles. Extremities: Full ROM. SLR + on the left. Strength 5/5. Neuro: Normal sensation and DTR in LE bilaterally. Psych: Alert and oriented. Normal mood and affect.  There are no preventive care reminders to display for this patient.    Assessment & Plan:   Problem List Items Addressed This Visit       Other   Acute left-sided low back pain without sciatica - Primary   Suspect musculoskeletal source of acute pain. I recommend we treat her with a course of naproxen  500 mg bid for 7 days and cyclobenzaprine  to assist with sleep. She should use moist heat and stretches. In light of her undergoing an LP tomorrow, I will provide a few Vicodin for her to have to help with positional pain she may experience.       Relevant Medications   naproxen  (NAPROSYN ) 500 MG tablet   cyclobenzaprine  (FLEXERIL ) 10 MG tablet   HYDROcodone -acetaminophen  (NORCO) 10-325 MG tablet    Return if symptoms worsen or fail to improve.   Garnette CHRISTELLA Simpler, MD

## 2023-11-11 NOTE — Assessment & Plan Note (Signed)
 Suspect musculoskeletal source of acute pain. I recommend we treat her with a course of naproxen  500 mg bid for 7 days and cyclobenzaprine  to assist with sleep. She should use moist heat and stretches. In light of her undergoing an LP tomorrow, I will provide a few Vicodin for her to have to help with positional pain she may experience.

## 2023-11-12 ENCOUNTER — Ambulatory Visit (HOSPITAL_COMMUNITY)
Admission: RE | Admit: 2023-11-12 | Discharge: 2023-11-12 | Disposition: A | Discharge status: Home / Self Care | Source: Ambulatory Visit | Attending: Neurology | Admitting: Neurology

## 2023-11-12 ENCOUNTER — Ambulatory Visit (HOSPITAL_COMMUNITY): Admitting: Licensed Clinical Social Worker

## 2023-11-12 DIAGNOSIS — H4710 Unspecified papilledema: Secondary | ICD-10-CM | POA: Diagnosis present

## 2023-11-12 LAB — CSF CELL COUNT WITH DIFFERENTIAL
RBC Count, CSF: 2 /mm3 — ABNORMAL HIGH
Tube #: 3
WBC, CSF: 1 /mm3 (ref 0–5)

## 2023-11-12 LAB — PROTEIN AND GLUCOSE, CSF
Glucose, CSF: 53 mg/dL (ref 40–70)
Total  Protein, CSF: 37 mg/dL (ref 15–45)

## 2023-11-12 MED ORDER — LIDOCAINE 1 % OPTIME INJ - NO CHARGE
5.0000 mL | Freq: Once | INTRAMUSCULAR | Status: AC
  Administered 2023-11-12: 4 mL via INTRADERMAL
  Filled 2023-11-12: qty 6

## 2023-11-13 ENCOUNTER — Encounter: Payer: Self-pay | Admitting: Neurology

## 2023-11-13 DIAGNOSIS — H4710 Unspecified papilledema: Secondary | ICD-10-CM

## 2023-11-13 LAB — CYTOLOGY - NON PAP

## 2023-11-13 NOTE — Telephone Encounter (Signed)
 Per Dr.Jaffe, Need to call lab and add following labs - oligoclonal bands, IgG index, Lyme.  Spoke to the lab add order and she will check to see if there is enough sample.

## 2023-11-14 ENCOUNTER — Telehealth: Payer: Self-pay | Admitting: Neurology

## 2023-11-14 DIAGNOSIS — H4710 Unspecified papilledema: Secondary | ICD-10-CM

## 2023-11-14 NOTE — Telephone Encounter (Signed)
 LMOVM Once labs are reviewed DR.Skeet will give you a call.  Labs added yesterday haven't come back yet.

## 2023-11-14 NOTE — Telephone Encounter (Signed)
 Pt called to get results and would like a call back asap

## 2023-11-15 LAB — CSF CULTURE W GRAM STAIN: Culture: NO GROWTH

## 2023-11-18 ENCOUNTER — Telehealth: Payer: Self-pay | Admitting: Neurology

## 2023-11-18 ENCOUNTER — Encounter: Payer: Self-pay | Admitting: Neurology

## 2023-11-18 DIAGNOSIS — H4710 Unspecified papilledema: Secondary | ICD-10-CM | POA: Insufficient documentation

## 2023-11-18 NOTE — Telephone Encounter (Signed)
 MRI of orbits with and without contrast was negative.  Underwent LP - opening pressure was normal.  Basic labs in the CSF were normal.  She denies improvement in vision after the LP.  Even though she denies eye pain and MRI does not reveal evidence of optic neuritis, plan is to schedule ASAP 5 day course of Solu-Medrol  1mg  IV.  I will also refer her urgently to ophthalmology to verify presence of papilledema in left ey.  Pending results, if papilledema identified, will repeat LP for CSF analysis of oligoclonal bands and IgG index and MRI of spinal cord.

## 2023-11-19 ENCOUNTER — Ambulatory Visit (INDEPENDENT_AMBULATORY_CARE_PROVIDER_SITE_OTHER)

## 2023-11-19 ENCOUNTER — Encounter: Payer: Self-pay | Admitting: Neurology

## 2023-11-19 ENCOUNTER — Other Ambulatory Visit: Payer: Self-pay | Admitting: Neurology

## 2023-11-19 ENCOUNTER — Telehealth: Payer: Self-pay | Admitting: Neurology

## 2023-11-19 ENCOUNTER — Telehealth: Payer: Self-pay

## 2023-11-19 VITALS — BP 109/76 | HR 83 | Temp 98.5°F | Resp 16 | Ht 65.0 in | Wt 184.0 lb

## 2023-11-19 DIAGNOSIS — H4710 Unspecified papilledema: Secondary | ICD-10-CM

## 2023-11-19 MED ORDER — METHYLPREDNISOLONE SODIUM SUCC 1000 MG IJ SOLR
1000.0000 mg | Freq: Once | INTRAMUSCULAR | Status: AC
  Administered 2023-11-19: 1000 mg via INTRAVENOUS
  Filled 2023-11-19: qty 16

## 2023-11-19 MED ORDER — METHYLPREDNISOLONE SODIUM SUCC 1000 MG IJ SOLR: 1000.0000 mg | Freq: Once | INTRAMUSCULAR | Status: DC

## 2023-11-19 NOTE — Telephone Encounter (Signed)
 See DR.Jaffe encounter from today.

## 2023-11-19 NOTE — Progress Notes (Signed)
 Diagnosis: Papilledema of L eye  Provider:  Lonna Coder MD  Procedure: IV Infusion  IV Type: Peripheral, IV Location: L Antecubital  Solumedrol (Methylprednisolone ), Dose: 1000 mg  Infusion Start Time: 1555  Infusion Stop Time 1700   Post Infusion IV Care: Peripheral IV Discontinued  Discharge: Condition: Good, Destination: Home . AVS Declined  Performed by:  Eleanor DELENA Bloch, RN

## 2023-11-19 NOTE — Telephone Encounter (Signed)
 It is too late to add any other labs to the CSF. Opening pressure was normal. She denies any improvement in eye sight after the LP. The MRI of orbits was unremarkable for optic neuritis and she denies eye pain but that remains a possibility and I have no other explanation We will go ahead and treat for optic neuritis with Solum-Medrol  1 gram IV daily for 5 days. Plan to also repeat LP to check for oligoclonal bands and IgG index.    Order sent to [facility] infusion center [facility].    Per Dr.Jaffe, MRI of orbits with and without contrast was negative.  Underwent LP - opening pressure was normal.  Basic labs in the CSF were normal.  She denies improvement in vision after the LP.  Even though she denies eye pain and MRI does not reveal evidence of optic neuritis, plan is to schedule ASAP 5 day course of Solu-Medrol  1mg  IV.  I will also refer her urgently to ophthalmology to verify presence of papilledema in left ey.  Pending results, if papilledema identified, will repeat LP for CSF analysis of oligoclonal bands and IgG index and MRI of spinal cord.      Referral sent URGENT.   Infusion will be schedule per center. Appt scheduled for this after noon at 3:30.   [facility] is able to see patient tomorrow at 8 Am with Dr.Glenn.

## 2023-11-19 NOTE — Telephone Encounter (Signed)
 Pt calling about Infusions The infusion center in [place] is able to see me today, tomorrow, Friday and Monday. Is there a center that has weekend availability? I will be out of town beginning on Tuesday of next week and had hoped to get all treatments in before I leave. If not should I wait until I return to start infusions? : call bk asap

## 2023-11-19 NOTE — Telephone Encounter (Addendum)
 Dr. Skeet and Jesusa, patient will be scheduled as soon as possible.  Auth Submission: NO AUTH NEEDED Site of care: Site of care: [facility] Payer: [employer] commercial Medication & CPT/J Code(s) submitted:  methylPREDNISolone  sodium succinate (SOLU-MEDROL ) J2919 Diagnosis Code:  Route of submission (phone, fax, portal): phone Phone # [phone] Fax # Auth type: Buy/Bill PB Units/visits requested: 1gm x 5 doses Reference number: [id number] Approval from: 11/19/23 to 04/21/24

## 2023-11-20 ENCOUNTER — Encounter: Payer: Self-pay | Admitting: Neurology

## 2023-11-20 ENCOUNTER — Ambulatory Visit

## 2023-11-20 VITALS — BP 132/81 | HR 84 | Temp 98.0°F | Resp 12 | Ht 65.0 in | Wt 183.2 lb

## 2023-11-20 DIAGNOSIS — H4710 Unspecified papilledema: Secondary | ICD-10-CM

## 2023-11-20 MED ORDER — METHYLPREDNISOLONE SODIUM SUCC 1000 MG IJ SOLR
1000.0000 mg | Freq: Once | INTRAMUSCULAR | Status: AC
  Administered 2023-11-20: 1000 mg via INTRAVENOUS
  Filled 2023-11-20: qty 16

## 2023-11-20 NOTE — Progress Notes (Signed)
 Diagnosis: Papilledema of Left EYE  Provider:  Lonna Coder MD  Procedure: IV Infusion  IV Type: Peripheral, IV Location: R Antecubital  Solumedrol (Methylprednisolone ), Dose: 1000 mg  Infusion Start Time: 1534  Infusion Stop Time: 1633  Post Infusion IV Care: Peripheral IV Discontinued  Discharge: Condition: Good, Destination: Home . AVS Declined  Performed by:  Tamani Durney, RN

## 2023-11-21 ENCOUNTER — Ambulatory Visit (INDEPENDENT_AMBULATORY_CARE_PROVIDER_SITE_OTHER)

## 2023-11-21 ENCOUNTER — Telehealth: Payer: Self-pay | Admitting: Neurology

## 2023-11-21 VITALS — BP 137/84 | HR 82 | Temp 98.0°F | Resp 12 | Ht 65.0 in | Wt 183.0 lb

## 2023-11-21 DIAGNOSIS — H4710 Unspecified papilledema: Secondary | ICD-10-CM

## 2023-11-21 MED ORDER — SODIUM CHLORIDE 0.9 % IV SOLN
1000.0000 mg | Freq: Once | INTRAVENOUS | Status: AC
  Administered 2023-11-21: 1000 mg via INTRAVENOUS
  Filled 2023-11-21: qty 16

## 2023-11-21 NOTE — Progress Notes (Signed)
 Diagnosis: Papilledema of left eye  Provider:  Lonna Coder MD  Procedure: IV Infusion  IV Type: Peripheral, IV Location: L Antecubital  Solumedrol (Methylprednisolone ), Dose: 1000 mg  Infusion Start Time: 1550  Infusion Stop Time: 1650  Post Infusion IV Care: Peripheral IV Discontinued  Discharge: Condition: Stable, Destination: Home . AVS Declined  Performed by:  Dorla Guizar, RN

## 2023-11-21 NOTE — Telephone Encounter (Signed)
 I spoke to the patient.  She has had 3 infusions of Solu-Medrol  thus far.  4th is scheduled for Monday (no infusions on the weekend).  She saw ophthalmology yesterday.  I don't yet have the notes but patient states papilledema in left eye confirmed.  However, he notices mild papilledema in right eye.  She does endorse that right eye vision not as clear as 2 weeks ago.  I want to go ahead and check blood work for optic neuropathy -  ANA, sed rate, CRP, B1,B12, vit A, folate, NMO antibody, Anti-MOG, ACE, paraneoplastic panel - needs to be sent to [facility] as she is a [employer] employee and the cost is free of charge.

## 2023-11-23 ENCOUNTER — Emergency Department (HOSPITAL_COMMUNITY)

## 2023-11-23 ENCOUNTER — Other Ambulatory Visit: Payer: Self-pay

## 2023-11-23 ENCOUNTER — Emergency Department (HOSPITAL_COMMUNITY)
Admission: EM | Admit: 2023-11-23 | Discharge: 2023-11-23 | Disposition: A | Discharge status: Home / Self Care | Attending: Emergency Medicine | Admitting: Emergency Medicine

## 2023-11-23 ENCOUNTER — Encounter (HOSPITAL_COMMUNITY): Payer: Self-pay | Admitting: *Deleted

## 2023-11-23 DIAGNOSIS — E876 Hypokalemia: Secondary | ICD-10-CM | POA: Insufficient documentation

## 2023-11-23 DIAGNOSIS — G43109 Migraine with aura, not intractable, without status migrainosus: Secondary | ICD-10-CM | POA: Insufficient documentation

## 2023-11-23 LAB — BASIC METABOLIC PANEL WITH GFR
Anion gap: 10 (ref 5–15)
BUN: 17 mg/dL (ref 6–20)
CO2: 23 mmol/L (ref 22–32)
Calcium: 8.4 mg/dL — ABNORMAL LOW (ref 8.9–10.3)
Chloride: 105 mmol/L (ref 98–111)
Creatinine, Ser: 0.67 mg/dL (ref 0.44–1.00)
GFR, Estimated: >60 mL/min (ref >60)
Glucose, Bld: 114 mg/dL — ABNORMAL HIGH (ref 70–99)
Potassium: 3.2 mmol/L — ABNORMAL LOW (ref 3.5–5.1)
Sodium: 138 mmol/L (ref 135–145)

## 2023-11-23 LAB — TROPONIN I (HIGH SENSITIVITY)
Troponin I (High Sensitivity): 4 ng/L (ref <18)
Troponin I (High Sensitivity): 4 ng/L (ref <18)

## 2023-11-23 LAB — CBC
HCT: 38.9 % (ref 36.0–46.0)
Hemoglobin: 12.6 g/dL (ref 12.0–15.0)
MCH: 28.8 pg (ref 26.0–34.0)
MCHC: 32.4 g/dL (ref 30.0–36.0)
MCV: 89 fL (ref 80.0–100.0)
Platelets: 288 K/uL (ref 150–400)
RBC: 4.37 MIL/uL (ref 3.87–5.11)
RDW: 13.2 % (ref 11.5–15.5)
WBC: 10.4 K/uL (ref 4.0–10.5)
nRBC: 0 % (ref 0.0–0.2)

## 2023-11-23 MED ORDER — METOCLOPRAMIDE HCL 5 MG/ML IJ SOLN
10.0000 mg | Freq: Once | INTRAMUSCULAR | Status: AC
  Administered 2023-11-23: 10 mg via INTRAVENOUS
  Filled 2023-11-23: qty 2

## 2023-11-23 MED ORDER — POTASSIUM CHLORIDE CRYS ER 20 MEQ PO TBCR
20.0000 meq | EXTENDED_RELEASE_TABLET | Freq: Once | ORAL | Status: AC
  Administered 2023-11-23: 20 meq via ORAL
  Filled 2023-11-23: qty 1

## 2023-11-23 MED ORDER — DIPHENHYDRAMINE HCL 50 MG/ML IJ SOLN
12.5000 mg | Freq: Once | INTRAMUSCULAR | Status: AC
  Administered 2023-11-23: 12.5 mg via INTRAVENOUS
  Filled 2023-11-23: qty 1

## 2023-11-23 MED ORDER — GADOBUTROL 1 MMOL/ML IV SOLN
8.0000 mL | Freq: Once | INTRAVENOUS | Status: AC | PRN
  Administered 2023-11-23: 8 mL via INTRAVENOUS

## 2023-11-23 NOTE — ED Triage Notes (Signed)
 The pt was diagnosed with optic nerve swelling July 8th  today she has had both eyes  with impaired vision pain in the back of her neck rt arm feels strange .  Rt head pressure also  lmp none

## 2023-11-23 NOTE — ED Provider Notes (Signed)
 [place] EMERGENCY DEPARTMENT AT [facility] Provider Note   CSN: [id number] Arrival date & time: 11/23/23  0009     Patient presents with: optic nerve swelling   Shelby Rios is a 48 y.o. female.   The history is provided by the patient.  Shelby Rios is a 48 y.o. female who presents to the Emergency Department complaining of vision changes.  She presents to the emergency department due to changes in her vision.  On July 7 she was diagnosed with bilateral optic neuritis, left worse than right.  She has had extensive outpatient workup with MRIs, CT scans and lumbar punctures.  She was started on Solu-Medrol  infusions by Dr. Ena with neurology 3 days ago.  She presents to the emergency department tonight due to having tenderness and pressure in the occipital region of her head and neck.  She did have about 5 minutes of worsening of vision bilaterally with right-sided head pressure but this is now back at her baseline vision.  She also reports tingling throughout her right upper extremity that feels like her arm is asleep.  Overall symptoms are improving.  Symptoms began at 1130 this evening.  She has no known medical problems.   Prior to Admission medications   Medication Sig Start Date End Date Taking? Authorizing Provider  buPROPion  (WELLBUTRIN  XL) 300 MG 24 hr tablet Take 1 tablet (300 mg total) by mouth daily. 10/06/23   Arfeen, Leni ONEIDA, MD  cholecalciferol (VITAMIN D3) 25 MCG (1000 UNIT) tablet Take 1,000 Units by mouth daily.    [provider]  cyclobenzaprine  (FLEXERIL ) 10 MG tablet Take 1 tablet (10 mg total) by mouth 3 (three) times daily as needed for muscle spasms. 11/11/23   Thedora Garnette HERO, MD  estradiol  (VIVELLE -DOT) 0.025 MG/24HR PLACE 1 PATCH ONTO THE SKIN 2 TIMES A WEEK. 10/20/23   Prentiss Riggs A, NP  HYDROcodone -acetaminophen  (NORCO) 10-325 MG tablet Take 1 tablet by mouth every 8 (eight) hours as needed (Use prior to procedure on 11/12/2023). 11/11/23    Thedora Garnette HERO, MD  ibuprofen (ADVIL) 200 MG tablet Take 200 mg by mouth as needed.    [provider]  loratadine (CLARITIN) 10 MG tablet Take 10 mg by mouth daily.    [provider]  MAGNESIUM PO Take 250 mg by mouth.    [provider]  multivitamin-iron-minerals-folic acid (CENTRUM) chewable tablet Chew 1 tablet by mouth daily.    [provider]  naproxen  (NAPROSYN ) 500 MG tablet Take 1 tablet (500 mg total) by mouth 2 (two) times daily with a meal. 11/11/23   Thedora Garnette HERO, MD  pantoprazole  (PROTONIX ) 20 MG tablet TAKE 1 TABLET BY MOUTH DAILY AS  NEEDED 10/07/23   Nche, Roselie Rockford, NP  Semaglutide -Weight Management (WEGOVY ) 2.4 MG/0.75ML SOAJ Inject 2.4 mg into the skin. Provided by [facility]    [provider]  vitamin B-12 (CYANOCOBALAMIN ) 100 MCG tablet Take 100 mcg by mouth daily.    [provider]    Allergies: Patient has no known allergies.    Review of Systems  All other systems reviewed and are negative.   Updated Vital Signs BP 111/79 (BP Location: Right Arm)   Pulse 84   Temp 98 F (36.7 C) (Oral)   Resp 15   Ht 5' 5 (1.651 m)   Wt 83 kg   LMP 07/22/2006   SpO2 100%   BMI 30.45 kg/m   Physical Exam Vitals and nursing  note reviewed.  Constitutional:      Appearance: She is well-developed.  HENT:     Head: Normocephalic and atraumatic.  Cardiovascular:     Rate and Rhythm: Normal rate and regular rhythm.     Heart sounds: No murmur heard. Pulmonary:     Effort: Pulmonary effort is normal. No respiratory distress.     Breath sounds: Normal breath sounds.  Abdominal:     Palpations: Abdomen is soft.     Tenderness: There is no abdominal tenderness. There is no guarding or rebound.  Musculoskeletal:        General: No tenderness.  Skin:    General: Skin is warm and dry.  Neurological:     Mental Status: She is alert and oriented to person, place, and time.     Comments: Pupils  equal round and reactive, EOMI.  No asymmetry of facial movements.  Visual fields grossly intact.  5 out of 5 strength in all 4 extremities with sensation to light touch intact in all 4 extremities.  Psychiatric:        Behavior: Behavior normal.     (all labs ordered are listed, but only abnormal results are displayed) Labs Reviewed  BASIC METABOLIC PANEL WITH GFR - Abnormal; Notable for the following components:      Result Value   Potassium 3.2 (*)    Glucose, Bld 114 (*)    Calcium 8.4 (*)    All other components within normal limits  CBC  TROPONIN I (HIGH SENSITIVITY)  TROPONIN I (HIGH SENSITIVITY)    EKG: EKG Interpretation Date/Time:  Sunday November 23 2023 00:28:02 EDT Ventricular Rate:  98 PR Interval:  118 QRS Duration:  92 QT Interval:  344 QTC Calculation: 439 R Axis:   47  Text Interpretation: Normal sinus rhythm Normal ECG Confirmed by Griselda Norris [phone]) on 11/23/2023 1:17:50 AM  Radiology: MR ANGIO HEAD WO CONTRAST Result Date: 11/23/2023 CLINICAL DATA:  48 year old female neurologic deficit. Headache. Vision changes. Right upper extremity numbness. EXAM: MRA HEAD WITHOUT CONTRAST TECHNIQUE: Angiographic images of the Circle of Willis were acquired using MRA technique without intravenous contrast. COMPARISON:  Neck MRA and brain MRI today reported separately. FINDINGS: Anterior circulation: Antegrade flow in both ICA siphons. No siphon stenosis. Carotid termini appear patent and normal. Normal MCA and ACA origins. Anterior communicating artery and visible bilateral ACA branches are normal. MCA M1 segments, MCA trifurcations, and visible bilateral MCA branches are normal. Posterior circulation: Antegrade flow in the posterior circulation with normal codominant distal vertebral arteries. Normal PICA origins. Patent basilar artery without stenosis. Normal SCA and PCA origins. Small left posterior communicating arteries suspected, the right is diminutive or absent.  Bilateral PCA branches are normal. Anatomic variants: None. Other: Brain MRI today reported separately. IMPRESSION: Normal intracranial MRA. Electronically Signed   By: VEAR Hurst M.D.   On: 11/23/2023 05:33   MR Angiogram Neck W or Wo Contrast Result Date: 11/23/2023 CLINICAL DATA:  48 year old female neurologic deficit. Headache. Vision changes. Right upper extremity numbness. EXAM: MRA NECK WITHOUT AND WITH CONTRAST TECHNIQUE: Multiplanar and multiecho pulse sequences of the neck were obtained without and with intravenous contrast. Angiographic images of the neck were obtained using MRA technique without and with intravenous contrast. CONTRAST:  8mL GADAVIST  GADOBUTROL  1 MMOL/ML IV SOLN COMPARISON:  Brain MRI and intracranial MRA today reported separately. FINDINGS: Precontrast time-of-flight images demonstrate antegrade flow signal in the bilateral cervical carotid and vertebral arteries. Carotid bifurcations are included and appear normal. Vertebral  arteries are codominant. Postcontrast images reveal a 3 vessel arch configuration. Proximal great vessels appear normal. Left CCA, right carotid bifurcation, cervical left ICA are enhancing and appear normal. Visible left ICA siphon and terminus also appear normal. Brachiocephalic artery, right CCA, right carotid bifurcation, and cervical right ICA are enhancing and appear normal. Visible right ICA siphon and terminus also appear normal. Proximal subclavian arteries and vertebral artery origins appear normal. Codominant cervical vertebral arteries appear normally enhancing to the vertebrobasilar junction. Visible posterior circulation also normal. 8 mm hypoenhancing right thyroid  nodule near the isthmus series 13, image 17. No follow-up imaging is recommended. Reference: J Am Coll Radiol. 2015 Feb;12(2): 143-50 IMPRESSION: Normal Neck MRA. Electronically Signed   By: VEAR Hurst M.D.   On: 11/23/2023 05:31   MR Brain W and Wo Contrast Result Date: 11/23/2023 CLINICAL  DATA:  48 year old female neurologic deficit. Headache. Vision changes. Right upper extremity numbness. EXAM: MRI HEAD WITHOUT AND WITH CONTRAST TECHNIQUE: Multiplanar, multiecho pulse sequences of the brain and surrounding structures were obtained without and with intravenous contrast. CONTRAST:  8mL GADAVIST  GADOBUTROL  1 MMOL/ML IV SOLN COMPARISON:  Brain MRI 10/27/2023. Brain MRA today reported separately. FINDINGS: Brain: Normal cerebral volume. No restricted diffusion to suggest acute infarction. No midline shift, mass effect, evidence of mass lesion, ventriculomegaly, extra-axial collection or acute intracranial hemorrhage. Cervicomedullary junction and pituitary are within normal limits. Stable gray and white matter signal throughout the brain, within normal limits for age (minimal white matter T2 and FLAIR hyperintensity which appears nonspecific and not age advanced). No chronic cerebral blood products on SWI. No abnormal enhancement identified.  No dural thickening. Vascular: Major intracranial vascular flow voids appear stable. Following contrast major dural venous sinuses are enhancing and appear to be patent. MRA reported separately today. Skull and upper cervical spine: Negative visible cervical spine. Visualized bone marrow signal is within normal limits. Sinuses/Orbits: Normal suprasellar cistern, optic chiasm. Cavernous sinus appears symmetric and normal. Orbits soft tissues appears symmetric and normal. Paranasal sinuses and mastoids are clear. Other: Visible internal auditory structures appear normal. Stylomastoid foramina, visible scalp and face appear negative. IMPRESSION: Normal for age MRI appearance of the Brain. MRA today reported separately. Electronically Signed   By: VEAR Hurst M.D.   On: 11/23/2023 05:28     Procedures   Medications Ordered in the ED  potassium chloride  SA (KLOR-CON  M) CR tablet 20 mEq (20 mEq Oral Given 11/23/23 0235)  metoCLOPramide  (REGLAN ) injection 10 mg (10 mg  Intravenous Given 11/23/23 0234)  diphenhydrAMINE  (BENADRYL ) injection 12.5 mg (12.5 mg Intravenous Given 11/23/23 0234)  gadobutrol  (GADAVIST ) 1 MMOL/ML injection 8 mL (8 mLs Intravenous Contrast Given 11/23/23 0425)                                    Medical Decision Making Amount and/or Complexity of Data Reviewed Labs: ordered. Radiology: ordered.  Risk Prescription drug management.   Patient with recent diagnosis of optic neuritis currently receiving outpatient infusions of Solu-Medrol  here for evaluation of new vision changes and paresthesias to the right upper extremity.  No focal deficits in the emergency department.  Discussed with on-call neurologist, Dr. Michaela.  Recommendation for MRI brain with and without, MRA head and neck. She was treated with medications for migraine and her symptoms resolved.  MRIs are negative for acute abnormality.  Discussed with neurology.  Patient is stable for outpatient follow-up with her primary neurologist  with return precautions.  BMP with mild hypokalemia-replaced orally.  Presentation is not consistent with CVA, dural sinus thrombosis, meningitis, MS.     Final diagnoses:  Complicated migraine    ED Discharge Orders     None          Griselda Norris, MD 11/23/23 [phone]

## 2023-11-24 ENCOUNTER — Ambulatory Visit

## 2023-11-24 VITALS — BP 133/85 | HR 54 | Temp 97.8°F | Resp 16 | Ht 65.0 in | Wt 185.4 lb

## 2023-11-24 DIAGNOSIS — H4710 Unspecified papilledema: Secondary | ICD-10-CM | POA: Diagnosis not present

## 2023-11-24 MED ORDER — SODIUM CHLORIDE 0.9 % IV SOLN
1000.0000 mg | Freq: Once | INTRAVENOUS | Status: AC
  Administered 2023-11-24: 1000 mg via INTRAVENOUS
  Filled 2023-11-24: qty 16

## 2023-11-24 NOTE — Progress Notes (Signed)
 Diagnosis: Papiledema of Left eye  Provider:  Lonna Coder MD  Procedure: IV Infusion  IV Type: Peripheral, IV Location: L Antecubital  Solumedrol (Methylprednisolone ), Dose: 1000mg   Infusion Start Time: 1344  Infusion Stop Time: 1445  Post Infusion IV Care: Peripheral IV Discontinued  Discharge: Condition: Good, Destination: Home . AVS Declined  Performed by:  Hollis Oh, RN

## 2023-11-25 ENCOUNTER — Ambulatory Visit

## 2023-11-25 VITALS — BP 144/86 | HR 92 | Temp 97.9°F | Resp 18 | Ht 65.0 in | Wt 184.4 lb

## 2023-11-25 DIAGNOSIS — H4710 Unspecified papilledema: Secondary | ICD-10-CM

## 2023-11-25 LAB — ANA W/REFLEX: Anti Nuclear Antibody (ANA): NEGATIVE

## 2023-11-25 MED ORDER — SODIUM CHLORIDE 0.9 % IV SOLN
1000.0000 mg | Freq: Once | INTRAVENOUS | Status: AC
Start: 2023-11-25 — End: 2023-11-25
  Administered 2023-11-25: 1000 mg via INTRAVENOUS
  Filled 2023-11-25: qty 16

## 2023-11-25 NOTE — Progress Notes (Signed)
 Diagnosis: Papiledema of left eye  Provider:  Lonna Coder MD  Procedure: IV Infusion  IV Type: Peripheral, IV Location: L Antecubital  Solumedrol (Methylprednisolone ), Dose: 1000 mg  Infusion Start Time: 0823  Infusion Stop Time: 0926  Post Infusion IV Care: Peripheral IV Discontinued  Discharge: Condition: Good, Destination: Home . AVS Declined  Performed by:  Gianlucas Evenson, RN

## 2023-11-29 LAB — LYME DISEASE, WESTERN BLOT
IgG P18 Ab.: ABSENT
IgG P23 Ab.: ABSENT
IgG P28 Ab.: ABSENT
IgG P30 Ab.: ABSENT
IgG P45 Ab.: ABSENT
IgG P66 Ab.: ABSENT
IgM P23 Ab.: ABSENT
IgM P39 Ab.: ABSENT
IgM P41 Ab.: ABSENT
Lyme IgG Wb: NEGATIVE
Lyme IgM Wb: NEGATIVE

## 2023-11-29 LAB — PARANEOPLASTIC AB
AGNA-1: NEGATIVE
Amphiphysin Antibody: NEGATIVE
Anti-Hu Ab: NEGATIVE
Anti-Ri Ab: NEGATIVE
Anti-Yo Ab: NEGATIVE
Antineruonal nuclear Ab Type 3: NEGATIVE
CASPR2 Antibody,Cell-based IFA: NEGATIVE
CRMP-5 IgG: NEGATIVE
Interpretation: NEGATIVE
LGI1 Antibody, Cell-based IFA: NEGATIVE
Purkinje Cell Cyto Ab Type 2: NEGATIVE
Purkinje Cell Cyto Ab Type Tr: NEGATIVE
VGCC Antibody: 7 pmol/L (ref 0.0–30.0)

## 2023-11-29 LAB — B12 AND FOLATE PANEL
Folate: 6.4 ng/mL (ref >3.0)
Vitamin B-12: 1470 pg/mL — ABNORMAL HIGH (ref 232–1245)

## 2023-11-29 LAB — SEDIMENTATION RATE: Sed Rate: 10 mm/h (ref 0–32)

## 2023-11-29 LAB — VITAMIN B1: Thiamine: 110 nmol/L (ref 66.5–200.0)

## 2023-11-29 LAB — C-REACTIVE PROTEIN: CRP: <1 mg/L (ref 0–10)

## 2023-11-29 LAB — ANTI-MOG, SERUM: MOG Antibody, Cell-based IFA: NEGATIVE

## 2023-11-29 LAB — NEUROMYELITIS OPTICA AUTOAB, IGG: NMO IgG Autoantibodies: <1.5 U/mL (ref 0.0–3.0)

## 2023-11-29 LAB — ANGIOTENSIN CONVERTING ENZYME: Angio Convert Enzyme: 29 U/L (ref 14–82)

## 2023-12-01 ENCOUNTER — Encounter: Payer: Self-pay | Admitting: Neurology

## 2023-12-01 ENCOUNTER — Ambulatory Visit: Payer: Self-pay | Admitting: Neurology

## 2023-12-01 DIAGNOSIS — H4710 Unspecified papilledema: Secondary | ICD-10-CM

## 2023-12-02 NOTE — Telephone Encounter (Signed)
 PER Dr.Jaffe, I spoke with Shelby Rios on the phone.  After discussion, she is agreeable to a repeat LP.  Check opening pressure (request that opening pressure be checked with patient in lateral decubitus position) and check CSF for cell count, protein, glucose, cytology, gram stain, culture, ACE, Lyme, VDRL, oligoclonal bands and IgG index.     Hold off on vitamin A.  If opening pressure is normal, will request checking serum vitamin A level.   LP ordered.

## 2023-12-03 ENCOUNTER — Encounter (HOSPITAL_COMMUNITY): Payer: Self-pay

## 2023-12-03 ENCOUNTER — Ambulatory Visit (HOSPITAL_COMMUNITY): Admitting: Licensed Clinical Social Worker

## 2023-12-07 ENCOUNTER — Other Ambulatory Visit (HOSPITAL_COMMUNITY): Payer: Self-pay | Admitting: Psychiatry

## 2023-12-07 DIAGNOSIS — F330 Major depressive disorder, recurrent, mild: Secondary | ICD-10-CM

## 2023-12-07 DIAGNOSIS — F419 Anxiety disorder, unspecified: Secondary | ICD-10-CM

## 2023-12-17 ENCOUNTER — Ambulatory Visit (INDEPENDENT_AMBULATORY_CARE_PROVIDER_SITE_OTHER): Admitting: Licensed Clinical Social Worker

## 2023-12-17 DIAGNOSIS — F330 Major depressive disorder, recurrent, mild: Secondary | ICD-10-CM

## 2023-12-17 DIAGNOSIS — F419 Anxiety disorder, unspecified: Secondary | ICD-10-CM

## 2023-12-17 NOTE — Progress Notes (Signed)
 THERAPIST PROGRESS NOTE   Session Date: 12/17/2023  Session Time: 1603 - 1713  Participation Level: Active  Behavioral Response: CasualAlertEuthymic  Type of Therapy: Individual Therapy  Treatment Goals addressed:   Initial (6) LTG: Increase coping skills to manage depression and improve ability to perform daily activities (OP Depression) STG: Reduce overall depression score by a minimum of 25% on the Patient Health Questionnaire (PHQ-9) (OP Depression) STG: Carri will identify cognitive patterns and beliefs that support depression (OP Depression) STG: Ines will reduce frequency of avoidant behaviors by 50% as evidenced by self-report in therapy sessions (OP Depression) STG: Bentleigh will practice behavioral activation skills 3x per week for the next 24 weeks (OP Depression) LTG: Setting healthy boundaries, recognize I can't fix other people's problems, so that I'm not using all of my energy to try helping someone else (OP Depression)  Progress Towards Goals: Progressing  Interventions: CBT, Motivational Interviewing, Solution Focused, Strength-based, and Supportive  Summary: Shelby Rios is a 48 y.o. female with psych history of MDD and Anxiety, presenting for follow-up therapy session in efforts to improve management of depressive symptoms.   Patient actively engaged in session, presenting in pleasant moods, and congruent affect throughout duration of visit. Patient openly engaged in introductory check-in, sharing of things Going okay, further detailing of having traveled to [place]  around July 4th, and having had significant increase in medical appointments over the past 6-8 weeks with Neurology and Radiology since returning from trip due to impaired vision, having multiple medical appointments and tests to explore vision impairments, proving unable to determine condition, and imaging from MRIs determining there to be optic swelling with continued unclear causes. 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 Processed individual abilities to recognize need to prioritize self and own medical needs, overall increased self-awareness, versus overexerting self to meet added optional church responsibilities. Explored pt's identified range of emotions in relation to experienced concerns, and overall challenges, further processing pt's anxiousness surrounding resolution.  Patient responded well to interventions. Patient continues to meet criteria for MDD and Anxiety. Patient will continue to benefit from engagement in outpatient therapy due to being the least restrictive service to meet presenting needs.       10/28/2023    9:44 AM 10/01/2023   10:14 AM 09/03/2023    8:17 AM 01/06/2023   10:15 AM 09/23/2022   11:03 AM  Depression screen PHQ 2/9  Decreased Interest 0 0 0 0 1  Down, Depressed, Hopeless 0 1 0 0 1  PHQ - 2 Score 0 1 0 0 2  Altered sleeping  1 1 2 2   Tired, decreased energy  1 2 2 2   Change in appetite  0 0 0 0  Feeling bad or failure about yourself   0 0 0 0  Trouble concentrating  1 3 2 3   Moving slowly or fidgety/restless  0 0 0 0  Suicidal thoughts  0 0 0 0  PHQ-9 Score  4 6 6 9   Difficult doing work/chores  Somewhat difficult Very difficult Somewhat difficult Somewhat difficult   Flowsheet Row ED from 11/23/2023 in [facility] Department at [facility] ED from 10/27/2023 in [facility] Emergency Department at [facility] Counselor from 09/03/2023 in [facility] at [facility]  C-SSRS RISK CATEGORY No Risk No Risk No Risk     Suicidal/Homicidal: Nowithout intent/plan  Therapist Response: Clinician utilized CBT, MI, Solution focused, strengths based,  and supportive reflection interventions to support patient in efforts to address presenting sxs and challenges surrounding presenting stressors.  Clinician openly greeted pt upon joining virtual visit, assessing presenting moods and affect, engaging in introductory check-in,  utilizing open ended questions in order to elicit pt's recounts of events over the past two months. Utilized active listening techniques to aid pt in reflecting on events, and presenting challenges, utilizing socratic questions to support pt in greater critical processing of recent challenges, and individual feelings surrounding medical conditions. Provided support and validation for pt's identified feelings and perspectives, supporting pt in challenging anxious thoughts in relation to stressors. Utilizing psychoed, CBT, and MI techniques to aid pt in navigating challenges.  Clinician reassessed severity of presenting sxs, and presence of any safety concerns. Clinician provided support and empathy to patient during session.  Plan: Return again in 2 weeks.  Diagnosis:  Encounter Diagnoses  Name Primary?   MDD (major depressive disorder), recurrent episode, mild (HCC) Yes   Anxiety      Collaboration of Care: Psychiatrist AEB provider documentation available in EHR.  Patient/Guardian was advised Release of Information must be obtained prior to any record release in order to collaborate their care with an outside provider. Patient/Guardian was advised if they have not already done so to contact the registration department to sign all necessary forms in order for us  to release information regarding their care.   Consent: Patient/Guardian gives verbal consent for treatment and assignment of benefits for services provided during this visit. Patient/Guardian expressed understanding and agreed to proceed.   Virtual Visit via Video Note  I connected with Leita ONEIDA Daring on 12/17/23 at  4:00 PM EDT by a video enabled telemedicine  application and verified that I am speaking with the correct person using two identifiers.  Location: Patient: Home Provider: Home Office   I discussed the limitations of evaluation and management by telemedicine and the availability of in person appointments. The patient expressed understanding and agreed to proceed.  I discussed the assessment and treatment plan with the patient. The patient was provided an opportunity to ask questions and all were answered. The patient agreed with the plan and demonstrated an understanding of the instructions.   The patient was advised to call back or seek an in-person evaluation if the symptoms worsen or if the condition fails to improve as anticipated.  I provided 70 minutes of non-face-to-face time during this encounter.  Lynwood JONETTA Maris, MSW, LCSW 12/17/2023,  4:04 PM

## 2023-12-24 ENCOUNTER — Encounter: Payer: Self-pay | Admitting: Neurology

## 2023-12-24 ENCOUNTER — Ambulatory Visit (HOSPITAL_COMMUNITY)
Admission: RE | Admit: 2023-12-24 | Discharge: 2023-12-24 | Disposition: A | Discharge status: Home / Self Care | Source: Ambulatory Visit | Attending: Neurology | Admitting: Neurology

## 2023-12-24 ENCOUNTER — Ambulatory Visit (HOSPITAL_COMMUNITY)
Admission: RE | Admit: 2023-12-24 | Discharge: 2023-12-24 | Disposition: A | Discharge status: Home / Self Care | Payer: Self-pay | Source: Ambulatory Visit | Attending: Neurology | Admitting: Neurology

## 2023-12-24 DIAGNOSIS — Z538 Procedure and treatment not carried out for other reasons: Secondary | ICD-10-CM | POA: Insufficient documentation

## 2023-12-24 DIAGNOSIS — Z01818 Encounter for other preprocedural examination: Secondary | ICD-10-CM | POA: Insufficient documentation

## 2023-12-24 NOTE — Addendum Note (Signed)
 Addended by: OZELL JESUSA PARAS on: 12/24/2023 03:50 PM   Modules accepted: Orders

## 2023-12-25 ENCOUNTER — Other Ambulatory Visit: Payer: Self-pay | Admitting: Neurology

## 2023-12-25 ENCOUNTER — Ambulatory Visit: Admitting: Nurse Practitioner

## 2023-12-25 DIAGNOSIS — H4710 Unspecified papilledema: Secondary | ICD-10-CM

## 2023-12-26 ENCOUNTER — Inpatient Hospital Stay (HOSPITAL_COMMUNITY): Admission: RE | Admit: 2023-12-26 | Source: Ambulatory Visit

## 2023-12-26 DIAGNOSIS — H4710 Unspecified papilledema: Secondary | ICD-10-CM

## 2023-12-29 ENCOUNTER — Ambulatory Visit (INDEPENDENT_AMBULATORY_CARE_PROVIDER_SITE_OTHER): Admitting: Licensed Clinical Social Worker

## 2023-12-29 DIAGNOSIS — F419 Anxiety disorder, unspecified: Secondary | ICD-10-CM

## 2023-12-29 DIAGNOSIS — F330 Major depressive disorder, recurrent, mild: Secondary | ICD-10-CM

## 2023-12-29 NOTE — Progress Notes (Unsigned)
 THERAPIST PROGRESS NOTE   Session Date: 12/29/2023  Session Time: 1605 - 1707  Participation Level: Active  Behavioral Response: CasualAlertEuthymic  Type of Therapy: Individual Therapy  Treatment Goals addressed:   Initial (6) LTG: Increase coping skills to manage depression and improve ability to perform daily activities (OP Depression) STG: Reduce overall depression score by a minimum of 25% on the Patient Health Questionnaire (PHQ-9) (OP Depression) STG: Shelby Rios will identify cognitive patterns and beliefs that support depression (OP Depression) STG: Shelby Rios will reduce frequency of avoidant behaviors by 50% as evidenced by self-report in therapy sessions (OP Depression) STG: Shelby Rios will practice behavioral activation skills 3x per week for the next 24 weeks (OP Depression) LTG: Setting healthy boundaries, recognize I can't fix other people's problems, so that I'm not using all of my energy to try helping someone else (OP Depression)  Progress Towards Goals: Progressing  Interventions: CBT, Motivational Interviewing, Solution Focused, Strength-based, and Supportive  Summary: Shelby Rios is a 48 y.o. female with psych history of MDD and Anxiety, presenting for follow-up therapy session in efforts to improve management of depressive symptoms.   Patient actively engaged in session, presenting in pleasant moods, and congruent affect throughout duration of visit. Patient openly engaged in introductory check-in, sharing of It's going okay, further detailing of having presented to [facility] for spinal tap procedure last week, having been prepped for procedure to be told 4+ hrs later of providers inability to complete procedure at location, resulting in rescheduling. Pt actively processed any variances in sxs, reporting of headaches over recent days, noting of potential contributing factors. Actively processed observed variances in moods or emotions, sharing of having found self to feel a little  more emotional over recent weeks, noting of it being pt's favorite season and reminiscing on times spent with uncle and aunt. ***  Patient responded well to interventions. Patient continues to meet criteria for MDD and Anxiety. Patient will continue to benefit from engagement in outpatient therapy due to being the least restrictive service to meet presenting needs.       10/28/2023    9:44 AM 10/01/2023   10:14 AM 09/03/2023    8:17 AM 01/06/2023   10:15 AM 09/23/2022   11:03 AM  Depression screen PHQ 2/9  Decreased Interest 0 0 0 0 1  Down, Depressed, Hopeless 0 1 0 0 1  PHQ - 2 Score 0 1 0 0 2  Altered sleeping  1 1 2 2   Tired, decreased energy  1 2 2 2   Change in appetite  0 0 0 0  Feeling bad or failure about yourself   0 0 0 0  Trouble concentrating  1 3 2 3   Moving slowly or fidgety/restless  0 0 0 0  Suicidal thoughts  0 0 0 0  PHQ-9 Score  4 6 6 9   Difficult doing work/chores  Somewhat difficult Very difficult Somewhat difficult Somewhat difficult   Flowsheet Row ED from 11/23/2023 in [facility] Emergency Department at [facility] ED from 10/27/2023 in [facility] Emergency Department at [facility] Counselor from 09/03/2023 in [facility] at [facility] RISK CATEGORY No Risk No Risk No Risk     Suicidal/Homicidal: Nowithout intent/plan  Therapist Response: Clinician utilized CBT, MI, Solution focused, strengths based, and supportive reflection interventions to support patient in efforts to address presenting sxs and challenges surrounding presenting stressors.  Clinician openly greeted pt upon joining virtual visit, assessing presenting moods and affect, engaging in  introductory check-in,  utilizing open ended questions in order to elicit pt's recounts of events over the past two months. Utilized active listening techniques to aid pt in reflecting on events, and presenting challenges, utilizing socratic questions to support pt in  greater critical processing of recent challenges, and individual feelings surrounding medical conditions. Provided support and validation for pt's identified feelings and perspectives, supporting pt in challenging anxious thoughts in relation to stressors. Utilizing psychoed, CBT, and MI techniques to aid pt in navigating challenges. ***  Clinician reassessed severity of presenting sxs, and presence of any safety concerns. Clinician provided support and empathy to patient during session.  Plan: Return again in 2 weeks.  Diagnosis:  Encounter Diagnoses  Name Primary?   MDD (major depressive disorder), recurrent episode, mild (HCC) Yes   Anxiety       Collaboration of Care: Psychiatrist AEB provider documentation available in EHR.  Patient/Guardian was advised Release of Information must be obtained prior to any record release in order to collaborate their care with an outside provider. Patient/Guardian was advised if they have not already done so to contact the registration department to sign all necessary forms in order for us  to release information regarding their care.   Consent: Patient/Guardian gives verbal consent for treatment and assignment of benefits for services provided during this visit. Patient/Guardian expressed understanding and agreed to proceed.   Virtual Visit via Video Note  I connected with Shelby Rios on 12/29/23 at  4:00 PM EDT by a video enabled telemedicine application and verified that I am speaking with the correct person using two identifiers.  Location: Patient: Home Provider: Home Office   I discussed the limitations of evaluation and management by telemedicine and the availability of in person appointments. The patient expressed understanding and agreed to proceed.  I discussed the assessment and treatment plan with the patient. The patient was provided an opportunity to ask questions and all were answered. The patient agreed with the plan and  demonstrated an understanding of the instructions.   The patient was advised to call back or seek an in-person evaluation if the symptoms worsen or if the condition fails to improve as anticipated.  I provided 62 minutes of non-face-to-face time during this encounter.  Lynwood JONETTA Maris, MSW, LCSW 12/29/2023,  4:08 PM

## 2023-12-30 ENCOUNTER — Telehealth: Payer: Self-pay

## 2023-12-30 ENCOUNTER — Ambulatory Visit (HOSPITAL_COMMUNITY)
Admission: RE | Admit: 2023-12-30 | Discharge: 2023-12-30 | Disposition: A | Discharge status: Home / Self Care | Source: Ambulatory Visit | Attending: Neurology

## 2023-12-30 ENCOUNTER — Other Ambulatory Visit: Payer: Self-pay | Admitting: Neurology

## 2023-12-30 ENCOUNTER — Other Ambulatory Visit (HOSPITAL_COMMUNITY): Payer: Self-pay | Admitting: Cardiology

## 2023-12-30 DIAGNOSIS — H4710 Unspecified papilledema: Secondary | ICD-10-CM

## 2023-12-30 HISTORY — PX: IR LUMBAR PUNCTURE: IMG944

## 2023-12-30 LAB — PROTEIN AND GLUCOSE, CSF
Glucose, CSF: 55 mg/dL (ref 40–70)
Total  Protein, CSF: 136 mg/dL — ABNORMAL HIGH (ref 15–45)

## 2023-12-30 NOTE — Telephone Encounter (Signed)
 Telephone call from [facility], Patient had LP today, Labs White blood count :33 Neutrophils: 57 EOS: 0 Lymphocyotes: 42 Monocytes: 7

## 2023-12-30 NOTE — Progress Notes (Signed)
 Pt returned to radiology nurses station post-LP to be recovered by this RN. Pt AO x4, lying flat, no reports of pain or HA. Spouse at bedside. All questions answered and understanding verbalized. Call bell within reach.

## 2023-12-30 NOTE — Discharge Instructions (Signed)
 Myelogram and Lumbar Puncture Discharge Instructions  Go home and rest quietly for the next 24 hours.  It is important to lie flat for the next 24 hours.  Get up only to go to the restroom.  You may lie in the bed or on a couch on your back, your stomach, your left side or your right side.  You may have one pillow under your head.  You may have pillows between your knees while you are on your side or under your knees while you are on your back.  DO NOT drive today.  Recline the seat as far back as it will go, while still wearing your seat belt, on the way home.  You may get up to go to the bathroom as needed.  You may sit up for 10 minutes to eat.  You may resume your normal diet and medications unless otherwise indicated.  The incidence of headache, nausea, or vomiting is about 5% (one in 20 patients).  If you develop a headache, lie flat and drink plenty of fluids until the headache goes away.  Caffeinated beverages may be helpful.  If you develop severe nausea and vomiting or a headache that does not go away with flat bed rest, call [phone].  You may resume normal activities after your 24 hours of bed rest is over; however, do not exert yourself strongly or do any heavy lifting tomorrow.  Call your physician for a follow-up appointment.  The results of your myelogram will be sent directly to your physician by the following day.   Discharge instructions have been explained to the patient.  The patient, or the person responsible for the patient, fully understands these instructions.

## 2023-12-30 NOTE — Progress Notes (Addendum)
 Pt and spouse given discharge instructions and paperwork, reviewed by this RN and all questions answered. Understanding verbalized. No reports of pain, HA, or N/V on discharge.

## 2023-12-31 ENCOUNTER — Other Ambulatory Visit: Payer: Self-pay | Admitting: Interventional Radiology

## 2023-12-31 ENCOUNTER — Telehealth: Payer: Self-pay | Admitting: Neurology

## 2023-12-31 DIAGNOSIS — H4710 Unspecified papilledema: Secondary | ICD-10-CM

## 2023-12-31 LAB — CYTOLOGY - NON PAP

## 2023-12-31 LAB — CSF CELL COUNT WITH DIFFERENTIAL
Eosinophils, CSF: 0 % (ref 0–1)
Lymphs, CSF: 42 % (ref 40–80)
Monocyte-Macrophage-Spinal Fluid: 7 % — ABNORMAL LOW (ref 15–45)
RBC Count, CSF: 72000 /mm3 — ABNORMAL HIGH
Segmented Neutrophils-CSF: 51 % — ABNORMAL HIGH (ref 0–6)
Tube #: 3
WBC, CSF: 33 /mm3 (ref 0–5)

## 2023-12-31 NOTE — Telephone Encounter (Signed)
 VDRL. It should be on the original order outside of oligoclonal bands and IgG   Spoke to [place] [facility] at Radiology to have labs added.   Per [name]!! Yes I'm having to call the lab because it's giving me an error since she didn't have a bloow draw  BR blood  1  11:45 AM   Message from [place] to Shelby Rios Shelby Rios.... it won't allow me to add certain orders on this patient. Can you try? She needs Gram stain, VDRL and oligoclonal bands & iGg. She did not get a blood draw yesterday so I'm unsure what the lab will be able to get off just the CSF. The rest of the labs were ordered correctly.    Per Shelby Rios:  Apologies on delayed response. I just spoke with lab for add-ons. I can add cytology and Gram stain on the order, however, VDRL and oligoclonal band analysis requires serum, and cannot be added, per Lab.   5 mins CC   DR.Skeet was made aware of note from [place].     My message to [place], Our Practice Admin wanted to know if you guys are adding this to the safety portal  11:48 AM BR Shelby Rios Yes, I will

## 2023-12-31 NOTE — Telephone Encounter (Signed)
 I called and spoke to Ms. Shelby Rios regarding CSF results.  It was a traumatic tap, which accounts for the elevated RBCs and WBCs.  Unfortunately, they did not send all of the labs that we wanted to check in the CSF.  This includes the OCBs and IgG, which requires serum sample as well.  Therefore, we will not be able to check those labs.  Opening pressure was still within normal range, therefore idiopathic intracranial hypertension is not suspected.  She also did not see improvement in headache or vision after the LP.  She has follow up appointment with Dr. Marcey at [facility] tomorrow.  If exam is worsening or unchanged, would send to neuro-ophthalmology.  She said she is still open to another LP if needed in order to get oligoclonal bands and IgG.  Of note, she did have a migraine over the weekend which has since resolved.

## 2024-01-01 ENCOUNTER — Encounter: Payer: Self-pay | Admitting: Neurology

## 2024-01-01 ENCOUNTER — Telehealth: Payer: Self-pay

## 2024-01-01 DIAGNOSIS — H4710 Unspecified papilledema: Secondary | ICD-10-CM

## 2024-01-01 NOTE — Telephone Encounter (Signed)
-----   Message from Juliene Lamar Dunnings sent at 01/01/2024 12:09 PM EDT ----- Need to refer to neuro-ophthalmology for bilateral papilledema Also check vitamin A level

## 2024-01-01 NOTE — Addendum Note (Signed)
 Addended by: OZELL JESUSA PARAS on: 01/01/2024 03:18 PM   Modules accepted: Orders

## 2024-01-02 LAB — CSF CULTURE W GRAM STAIN: Culture: NO GROWTH

## 2024-01-05 ENCOUNTER — Telehealth (HOSPITAL_BASED_OUTPATIENT_CLINIC_OR_DEPARTMENT_OTHER): Admitting: Psychiatry

## 2024-01-05 ENCOUNTER — Encounter (HOSPITAL_COMMUNITY): Payer: Self-pay | Admitting: Psychiatry

## 2024-01-05 VITALS — Wt 184.0 lb

## 2024-01-05 DIAGNOSIS — F330 Major depressive disorder, recurrent, mild: Secondary | ICD-10-CM

## 2024-01-05 DIAGNOSIS — F419 Anxiety disorder, unspecified: Secondary | ICD-10-CM | POA: Diagnosis not present

## 2024-01-05 MED ORDER — BUPROPION HCL ER (XL) 300 MG PO TB24
300.0000 mg | ORAL_TABLET | Freq: Every day | ORAL | 0 refills | Status: AC
Start: 2024-01-05 — End: ?

## 2024-01-05 NOTE — Progress Notes (Signed)
 [facility] MD Virtual Progress Note   Patient Location: Home Provider Location: Home Office  I connect with patient by video and verified that I am speaking with correct person by using two identifiers. I discussed the limitations of evaluation and management by telemedicine and the availability of in person appointments. I also discussed with the patient that there may be a patient responsible charge related to this service. The patient expressed understanding and agreed to proceed.  Shelby Rios [id number] 48 y.o.  01/05/2024 9:40 AM  History of Present Illness:  Patient is evaluated by video session.  She reported had an incident after she came back from [place]  trip in July and she noticed vision impairment in her left eye.  Since then she has multiple blood work, doctors visit and seen eye specialist.  She has headaches, still left eye vision impairment but able to drive.  She is seeing neurologist and had multiple test but so far no definite diagnosis.  Now she is going to see neuro-ophthalmologist at [facility].  Patient told that she is able to drive but have some anxiety and concern about the future since knowing of the diagnosis.  She has a good support from her husband.  Patient is able to go to [place] with her husband on her vacation but only out of the discussion with the doctors.  Patient works for [employer].  She reported some challenges at her job but overall job is going well and staff is helpful.  Patient told she was taking low-dose estrogen to help her hot flashes and she noticed sleep is better but since incident happened she is no longer taking estrogen, Wegovy , muscle relaxant and any other medication other than magnesium and Wellbutrin .  Her blood pressure is also most of the time stable.  She is in therapy with [facility].  She denies any major panic attack.  She reported overall emotionally better and hoping her vision impairment get better and she is keeping all the  doctors appointment.  She denies any hallucination, paranoia, suicidal thoughts.  She has some anxiety but situational.  Her appetite is okay.  Since no longer taking Wegovy  she may have gained few pounds.  She also took intravenous steroids to reduce the swelling of her optic nerves which was recommended by neurologist.  Patient told she was extensive workup for vision impairment and so far no definite diagnosis but presumption of multiple sclerosis, stroke.  She wants to continue therapy and Wellbutrin  which is keeping her mood and anxiety stable.  Past Psychiatric History: H/O depression and anxiety.  Took Paxil, Prozac, Vistaril, Zoloft , Effexor , BuSpar  and Wellbutrin .  Medicines are prescribed from PCP.  No history of suicidal attempt, inpatient treatment or mania.  Wellbutrin  450 did not work.  Had psychological testing at [facility] for [facility] but it was inconclusive.   Past Medical History:  Diagnosis Date   Abnormal mammogram, unspecified    Abnormal Pap smear of cervix    Abnormal weight gain    Allergy    Anemia    Anxiety    Back pain    Breast pain, right    Depression    Dyspepsia    Edema    Endometriosis    Fatigue    GERD (gastroesophageal reflux disease)    H. pylori infection    HSV-1 infection    oral   Hypertension    only during pregnancy   Migraines    Nonspecific abnormal results of other specified  function study    Obesity    Palpitations    Routine general medical examination at a health care facility    Seizures [facility])    couple seisure around age 76- tx with dilantin for a period then d/c   Sleep apnea    c-pap wears   Vitamin B12 deficiency 11/02/2014    Outpatient Encounter Medications as of 01/05/2024  Medication Sig   buPROPion  (WELLBUTRIN  XL) 300 MG 24 hr tablet Take 1 tablet (300 mg total) by mouth daily.   cholecalciferol (VITAMIN D3) 25 MCG (1000 UNIT) tablet Take 1,000 Units by mouth daily.   cyclobenzaprine  (FLEXERIL ) 10 MG  tablet Take 1 tablet (10 mg total) by mouth 3 (three) times daily as needed for muscle spasms.   estradiol  (VIVELLE -DOT) 0.025 MG/24HR PLACE 1 PATCH ONTO THE SKIN 2 TIMES A WEEK.   HYDROcodone -acetaminophen  (NORCO) 10-325 MG tablet Take 1 tablet by mouth every 8 (eight) hours as needed (Use prior to procedure on 11/12/2023).   ibuprofen (ADVIL) 200 MG tablet Take 200 mg by mouth as needed.   loratadine (CLARITIN) 10 MG tablet Take 10 mg by mouth daily.   MAGNESIUM PO Take 250 mg by mouth.   multivitamin-iron-minerals-folic acid (CENTRUM) chewable tablet Chew 1 tablet by mouth daily.   naproxen  (NAPROSYN ) 500 MG tablet Take 1 tablet (500 mg total) by mouth 2 (two) times daily with a meal.   pantoprazole  (PROTONIX ) 20 MG tablet TAKE 1 TABLET BY MOUTH DAILY AS  NEEDED   Semaglutide -Weight Management (WEGOVY ) 2.4 MG/0.75ML SOAJ Inject 2.4 mg into the skin. Provided by [facility]   vitamin B-12 (CYANOCOBALAMIN ) 100 MCG tablet Take 100 mcg by mouth daily.   No facility-administered encounter medications on file as of 01/05/2024.    Recent Results (from the past 2160 hours)  CBC with Differential     Status: None   Collection Time: 10/27/23 12:25 PM  Result Value Ref Range   WBC 4.7 4.0 - 10.5 K/uL   RBC 4.42 3.87 - 5.11 MIL/uL   Hemoglobin 12.7 12.0 - 15.0 g/dL   HCT 59.9 63.9 - 53.9 %   MCV 90.5 80.0 - 100.0 fL   MCH 28.7 26.0 - 34.0 pg   MCHC 31.8 30.0 - 36.0 g/dL   RDW 86.4 88.4 - 84.4 %   Platelets 258 150 - 400 K/uL   nRBC 0.0 0.0 - 0.2 %   Neutrophils Relative % 41 %   Neutro Abs 1.9 1.7 - 7.7 K/uL   Lymphocytes Relative 42 %   Lymphs Abs 2.0 0.7 - 4.0 K/uL   Monocytes Relative 14 %   Monocytes Absolute 0.6 0.1 - 1.0 K/uL   Eosinophils Relative 2 %   Eosinophils Absolute 0.1 0.0 - 0.5 K/uL   Basophils Relative 1 %   Basophils Absolute 0.0 0.0 - 0.1 K/uL   WBC Morphology See Note     Comment: >10% reactive, Benign Lymphocytes.   RBC Morphology See Note     Comment:  Morphology unremarkable   Smear Review See Note     Comment: Normal Platelet Morphology   Immature Granulocytes 0 %   Abs Immature Granulocytes 0.02 0.00 - 0.07 K/uL   Tear Drop Cells PRESENT     Comment: Performed at [facility], [address] [place], [place] [address]  Comprehensive metabolic panel     Status: Abnormal   Collection Time: 10/27/23 12:25 PM  Result Value Ref Range   Sodium 140 135 -  145 mmol/L   Potassium 4.1 3.5 - 5.1 mmol/L   Chloride 105 98 - 111 mmol/L   CO2 27 22 - 32 mmol/L   Glucose, Bld 108 (H) 70 - 99 mg/dL    Comment: Glucose reference range applies only to samples taken after fasting for at least 8 hours.   BUN 7 6 - 20 mg/dL   Creatinine, Ser 9.25 0.44 - 1.00 mg/dL   Calcium 8.8 (L) 8.9 - 10.3 mg/dL   Total Protein 7.0 6.5 - 8.1 g/dL   Albumin 3.5 3.5 - 5.0 g/dL   AST 22 15 - 41 U/L   ALT 17 0 - 44 U/L   Alkaline Phosphatase 52 38 - 126 U/L   Total Bilirubin 0.5 0.0 - 1.2 mg/dL   GFR, Estimated >39 >39 mL/min    Comment: (NOTE) Calculated using the CKD-EPI Creatinine Equation (2021)    Anion gap 8 5 - 15    Comment: Performed at [facility], [address] [place], [place] [address]  TSH     Status: None   Collection Time: 10/27/23 12:25 PM  Result Value Ref Range   TSH 1.192 0.350 - 4.500 uIU/mL    Comment: Performed by a 3rd Generation assay with a functional sensitivity of <=0.01 uIU/mL. Performed at [facility], [address], [place], [place] [address]   CBG monitoring, ED     Status: Abnormal   Collection Time: 10/27/23 12:45 PM  Result Value Ref Range   Glucose-Capillary 108 (H) 70 - 99 mg/dL    Comment: Glucose reference range applies only to samples taken after fasting for at least 8 hours.  I-stat chem 8, ED (not at [facility], [facility] or [facility])     Status: Abnormal   Collection Time: 10/27/23 12:47 PM  Result Value Ref Range   Sodium 140 135 - 145 mmol/L   Potassium 4.3 3.5 - 5.1 mmol/L   Chloride 101 98 - 111  mmol/L   BUN 6 6 - 20 mg/dL   Creatinine, Ser 9.29 0.44 - 1.00 mg/dL   Glucose, Bld 892 (H) 70 - 99 mg/dL    Comment: Glucose reference range applies only to samples taken after fasting for at least 8 hours.   Calcium, Ion 1.21 1.15 - 1.40 mmol/L   TCO2 27 22 - 32 mmol/L   Hemoglobin 13.6 12.0 - 15.0 g/dL   HCT 59.9 63.9 - 53.9 %  Cytology - Non PAP;     Status: None   Collection Time: 11/12/23  8:45 AM  Result Value Ref Range   CYTOLOGY - NON GYN      CYTOLOGY - NON PAP CASE: [id number] PATIENT: Renatta Labarbera Non-Gynecological Cytology Report     Clinical History: Acute onset of a left visual loss; acute onset of left lower back pain Specimen Submitted:  A. CEREBRAL SPINAL FLUID, SPINAL TAP:   FINAL MICROSCOPIC DIAGNOSIS: - No malignant cells identified - Scattered rare lymphocytes and monocytes  SPECIMEN ADEQUACY: Satisfactory for evaluation  GROSS: Received is/are:4cc's of clear, colorless fluid. (MW:mw)  Smears: 0 Concentration Method (Thin Prep): 1 Cell Block: 0 Additional Studies: 2 Hematology slides labeled #[id number]     Final Diagnosis performed by Prentice Pitcher, MD.   Electronically signed 11/13/2023 Technical and / or Professional components performed at [facility]. [facility], [address] [place], [place] [address].  Immunohistochemistry Technical component (if applicable) was performed at [facility]. [address], [place], [place]  [id number] MASTER DISCLAIMER (if applicable): Some of these immunohistochemical stains may have been developed and the performance characteristics determine by [facility]. Some may not have been cleared or approved by the U.S. Food and Drug Administration. The FDA has determined that such clearance or approval is not necessary. This test is used for clinical purposes. It should not be regarded as investigational or for research. This laboratory  is certified under the Clinical Laboratory Improvement Amendments of 1988 (CLIA-88) as qualified to perform high complexity clinical laboratory testing.  The controls stained appropriately.   IHC stains are performed on formalin fixed, paraffin embedded tissue using a 3,3diaminobenzidine (DAB) chromogen and Leica Bond Autostainer System. The staining intensity of the nucleus is score manually and is reported as the percentage of tumor cell nuclei demonstrating specific nuclear staining. The specimens are fixed in  10% Neutral Formalin for at least 6 hours and up to 72hrs. These tests are validated on decalcified tissue. Results should be interpreted with caution given the possibility of false negative results on decalcified specimens. Antibody Clones are as follows ER-clone 37F, PR-clone 16, Ki67- clone MM1. Some of these immunohistochemical stains may have been developed and the performance characteristics determined by [facility].   CSF cell count with differential     Status: Abnormal   Collection Time: 11/12/23  8:45 AM  Result Value Ref Range   Tube # 3    Color, CSF COLORLESS COLORLESS   Appearance, CSF CLEAR CLEAR   Supernatant NOT INDICATED    RBC Count, CSF 2 (H) 0 /cu mm   WBC, CSF 1 0 - 5 /cu mm   Other Cells, CSF TOO FEW TO COUNT, SMEAR AVAILABLE FOR REVIEW     Comment: RARE LYMPHOCYTES AND MONOCYTES SEEN ON SMEAR. Performed at [facility], [address] [place], [place] [address]   CSF culture w Gram Stain     Status: None   Collection Time: 11/12/23  8:45 AM   Specimen: PATH Cytology CSF; Cerebrospinal Fluid  Result Value Ref Range   Specimen Description CSF    Special Requests CEREBROSPINAL    Gram Stain      WBC PRESENT, PREDOMINANTLY MONONUCLEAR NO ORGANISMS SEEN CYTOSPIN SMEAR    Culture      NO GROWTH 3 DAYS Performed at [facility], [address] [place], [place] [address]    Report Status 11/15/2023 FINAL   Protein and  glucose, CSF     Status: None   Collection Time: 11/12/23  8:45 AM  Result Value Ref Range   Glucose, CSF 53 40 - 70 mg/dL   Total  Protein, CSF 37 15 - 45 mg/dL    Comment: Performed at [facility], [address], [place], [place] [address]  Basic metabolic panel     Status: Abnormal   Collection Time: 11/23/23 12:34 AM  Result Value Ref Range   Sodium 138 135 - 145 mmol/L   Potassium 3.2 (L) 3.5 - 5.1 mmol/L   Chloride 105 98 - 111 mmol/L   CO2 23 22 - 32 mmol/L   Glucose, Bld 114 (H) 70 - 99 mg/dL    Comment: Glucose reference range applies only to samples taken after fasting for at least 8 hours.   BUN 17 6 - 20 mg/dL   Creatinine, Ser 9.32 0.44 - 1.00 mg/dL   Calcium 8.4 (L) 8.9 - 10.3 mg/dL   GFR, Estimated >39 >39 mL/min  Comment: (NOTE) Calculated using the CKD-EPI Creatinine Equation (2021)    Anion gap 10 5 - 15    Comment: Performed at [facility], [address] [place], [place] [address]  CBC     Status: None   Collection Time: 11/23/23 12:34 AM  Result Value Ref Range   WBC 10.4 4.0 - 10.5 K/uL   RBC 4.37 3.87 - 5.11 MIL/uL   Hemoglobin 12.6 12.0 - 15.0 g/dL   HCT 61.0 63.9 - 53.9 %   MCV 89.0 80.0 - 100.0 fL   MCH 28.8 26.0 - 34.0 pg   MCHC 32.4 30.0 - 36.0 g/dL   RDW 86.7 88.4 - 84.4 %   Platelets 288 150 - 400 K/uL   nRBC 0.0 0.0 - 0.2 %    Comment: Performed at [facility], [address] [place], [place] [address]  Troponin I (High Sensitivity)     Status: None   Collection Time: 11/23/23 12:34 AM  Result Value Ref Range   Troponin I (High Sensitivity) 4 <18 ng/L    Comment: (NOTE) Elevated high sensitivity troponin I (hsTnI) values and significant  changes across serial measurements may suggest ACS but many other  chronic and acute conditions are known to elevate hsTnI results.  Refer to the Links section for chest pain algorithms and additional  guidance. Performed at [facility], [address] [place],  [place] [address]   Troponin I (High Sensitivity)     Status: None   Collection Time: 11/23/23  2:23 AM  Result Value Ref Range   Troponin I (High Sensitivity) 4 <18 ng/L    Comment: (NOTE) Elevated high sensitivity troponin I (hsTnI) values and significant  changes across serial measurements may suggest ACS but many other  chronic and acute conditions are known to elevate hsTnI results.  Refer to the Links section for chest pain algorithms and additional  guidance. Performed at [facility], [address] [place], [place] [address]   ANA w/Reflex     Status: None   Collection Time: 11/24/23 11:21 AM  Result Value Ref Range   Anti Nuclear Antibody (ANA) Negative Negative  Vitamin B1     Status: None   Collection Time: 11/24/23 11:22 AM  Result Value Ref Range   Thiamine 110.0 66.5 - 200.0 nmol/L  Sedimentation rate     Status: None   Collection Time: 11/24/23 11:22 AM  Result Value Ref Range   Sed Rate 10 0 - 32 mm/hr  C-reactive protein     Status: None   Collection Time: 11/24/23 11:22 AM  Result Value Ref Range   CRP <1 0 - 10 mg/L  B12 and Folate Panel     Status: Abnormal   Collection Time: 11/24/23 11:22 AM  Result Value Ref Range   Vitamin B-12 1,470 (H) 232 - 1,245 pg/mL   Folate 6.4 >3.0 ng/mL    Comment: A serum folate concentration of less than 3.1 ng/mL is considered to represent clinical deficiency.   Anti-MOG, Serum     Status: None   Collection Time: 11/24/23 11:22 AM  Result Value Ref Range   MOG Antibody, Cell-based IFA Negative Negative  Neuromyelitis optica autoab, IgG     Status: None   Collection Time: 11/24/23 11:22 AM  Result Value Ref Range   NMO IgG Autoantibodies <1.5 0.0 - 3.0 U/mL    Comment:  Negative:      0.0 - 3.0                              Positive:           >3.0   Angiotensin converting enzyme     Status: None   Collection Time: 11/24/23 11:22 AM  Result Value Ref Range   Angio Convert Enzyme 29 14  - 82 U/L  Paraneoplastic Ab     Status: None   Collection Time: 11/24/23 11:22 AM  Result Value Ref Range   Interpretation Negative Negative    Comment: A negative paraneoplastic autoantibody result does not rule out all clinically relevant antibodies. If indicated, a phenotype- specific profile (For example, Encephalopathy, dementia, myelopathy, or axonal neuropathy) should be considered.    Anti-Hu Ab Negative Negative   Anti-Ri Ab Negative Negative   Antineruonal nuclear Ab Type 3 Negative Negative   Anti-Yo Ab Negative Negative   Purkinje Cell Cyto Ab Type 2 Negative Negative   Purkinje Cell Cyto Ab Type Tr Negative Negative   Amphiphysin Antibody Negative Negative   CRMP-5 IgG Negative Negative   AGNA-1 Negative Negative   VGCC Antibody 7.0 0.0 - 30.0 pmol/L   CASPR2 Antibody,Cell-based IFA Negative Negative   LGI1 Antibody, Cell-based IFA Negative Negative  Lyme Disease, Western Blot     Status: None   Collection Time: 11/24/23 11:22 AM  Result Value Ref Range   Lyme IgG Wb Negative Negative     IgG P93 Ab. Present      IgG P66 Ab. Absent      IgG P58 Ab. Present      IgG P45 Ab. Absent      IgG P41 Ab. Present      IgG P39 Ab. Present      IgG P30 Ab. Absent      IgG P28 Ab. Absent      IgG P23 Ab. Absent      IgG P18 Ab. Absent    Lyme IgM Wb Negative Negative    Comment: Please Note: Lyme immunoblot alone is not recommended for the diagnosis of Lyme disease. Current guidelines recommend the use of a two-tiered approach to Lyme serology testing to improve the sensitivity and specificity of testing. [facility] offers test code [phone] Lyme Disease Serology with Reflex to aid in the diagnosis of Lyme Disease.      IgM P41 Ab. Absent      IgM P39 Ab. Absent      IgM P23 Ab. Absent    Additional Information Comment     Comment: Per CDC criteria, the Lyme IgG Immunoblot is interpreted as positive if IgG-class antibodies are detected to 5 or more B.  burgdorferi proteins, and the Lyme IgM Immunoblot is interpreted as positive if IgM-class antibodies are detected to 2 or more B. burgdorferi proteins. Immunoblot patterns not meeting these criteria should not be interpreted as positive. Epitopes from certain B. burgdorferi proteins (e.g., p41) are conserved across other bacteria, which may lead to the detection of IgM-and/or IgG class antibodies on the Lyme disease immunoblots in patients without Lyme disease. Immunoblot should only be ordered on specimens that are positive or equivocal by an FDA-licensed Lyme disease antibody screening test (e.g., EIA). Results of the Lyme IgM immunoblot should not be considered in patients with 30 or more days of symptoms.   Cytology - Non PAP;     Status: None  Collection Time: 12/30/23 10:24 AM  Result Value Ref Range   CYTOLOGY - NON GYN      CYTOLOGY - NON PAP CASE: [id number] PATIENT: Shelby Rios Non-Gynecological Cytology Report     Clinical History: Hx vision changes - bilateral optic neuritis; tenderness and pressure in the occipital region of her head and neck. Specimen Submitted:  A. CEREBRAL SPINAL FLUID, SPINAL TAP:   FINAL MICROSCOPIC DIAGNOSIS: - No malignant cells identified  SPECIMEN ADEQUACY: Satisfactory for evaluation  GROSS: Received is/are 1cc of red fluid.(ME:me) Smears: 0 Concentration Method (Thin Prep): 1 Cell Block: 0 Additional studies: 2 Hematology slides labeled #[id number]     Final Diagnosis performed by Norleen Dover, MD.   Electronically signed 12/31/2023 Technical and / or Professional components performed at [facility]. [facility], [address], [place], [place] [address].  Immunohistochemistry Technical component (if applicable) was performed at [facility]. [address], [place], [place] [address].   IMMUNOHISTOCHEMISTRY  DISCLAIMER (if applicable): Some of these immunohistochemical stains may have been  developed and the performance characteristics determine by [facility]. Some may not have been cleared or approved by the U.S. Food and Drug Administration. The FDA has determined that such clearance or approval is not necessary. This test is used for clinical purposes. It should not be regarded as investigational or for research. This laboratory is certified under the Clinical Laboratory Improvement Amendments of 1988 (CLIA-88) as qualified to perform high complexity clinical laboratory testing.  The controls stained appropriately.   IHC stains are performed on formalin fixed, paraffin embedded tissue using a 3,3diaminobenzidine (DAB) chromogen and Leica Bond Autostainer System. The staining intensity of the nucleus is score manually and is reported as the percentage of tumor cell nuclei demonstrating specific nuclear staining. The specimens are fixed in 10% Neutral Formalin  for at least 6 hours and up to 72hrs. These tests are validated on decalcified tissue. Results should be interpreted with caution given the possibility of false negative results on decalcified specimens. Antibody Clones are as follows ER-clone 49F, PR-clone 16, Ki67- clone MM1. Some of these immunohistochemical stains may have been developed and the performance characteristics determined by [facility].   CSF cell count with differential     Status: Abnormal   Collection Time: 12/30/23 10:24 AM  Result Value Ref Range   Tube # 3    Color, CSF RED (A) COLORLESS   Appearance, CSF CLOUDY (A) CLEAR   Supernatant COLORLESS    RBC Count, CSF 72,000 (H) 0 /cu mm   WBC, CSF 33 (HH) 0 - 5 /cu mm    Comment: CRITICAL RESULT CALLED TO, READ BACK BY AND VERIFIED WITH: JESUSA SHARPER, CMA @ 1505 12/30/2023 SORTOG CORRECTED ON 09/10 AT 1244: PREVIOUSLY REPORTED AS 33 CRITICAL RESULT CALLED TO, READ BACK BY AND VERIFIED WITH: JESUSA SHARPER, CNA @ 1505 12/30/2023 SORTOG    Segmented Neutrophils-CSF 51  (H) 0 - 6 %   Lymphs, CSF 42 40 - 80 %   Monocyte-Macrophage-Spinal Fluid 7 (L) 15 - 45 %   Eosinophils, CSF 0 0 - 1 %    Comment: Performed at [facility], [address], [place], [place] [address]  CSF culture w Gram Stain     Status: None   Collection Time: 12/30/23 10:24 AM   Specimen: PATH Cytology CSF; Cerebrospinal Fluid  Result Value Ref Range   Specimen Description CSF    Special Requests PATH CYTOLOGY CSF  Gram Stain      WBC PRESENT,BOTH PMN AND MONONUCLEAR NO ORGANISMS SEEN CYTOSPIN SMEAR    Culture      NO GROWTH 3 DAYS Performed at [facility], [address], [place], [place] [address]    Report Status 01/02/2024 FINAL   Protein and glucose, CSF     Status: Abnormal   Collection Time: 12/30/23 10:24 AM  Result Value Ref Range   Glucose, CSF 55 40 - 70 mg/dL   Total  Protein, CSF 863 (H) 15 - 45 mg/dL    Comment: Performed at [facility], [address], [place], [place] [address]     Psychiatric Specialty Exam: Physical Exam  Review of Systems  Eyes:  Positive for visual disturbance.  Neurological:  Positive for headaches.  Psychiatric/Behavioral:  The patient is nervous/anxious.     Weight 184 lb (83.5 kg), last menstrual period 07/22/2006.There is no height or weight on file to calculate BMI.  General Appearance: Casual  Eye Contact:  Good  Speech:  Normal Rate  Volume:  Normal  Mood:  Anxious  Affect:  Appropriate  Thought Process:  Goal Directed  Orientation:  Full (Time, Place, and Person)  Thought Content:  Rumination  Suicidal Thoughts:  No  Homicidal Thoughts:  No  Memory:  Immediate;   Good Recent;   Good Remote;   Good  Judgement:  Intact  Insight:  Present  Psychomotor Activity:  Normal  Concentration:  Concentration: Good and Attention Span: Good  Recall:  Good  Fund of Knowledge:  Good  Language:  Good  Akathisia:  No  Handed:  Right  AIMS (if indicated):     Assets:  Communication Skills Desire for  Improvement Housing Social Support Transportation  ADL's:  Intact  Cognition:  WNL  Sleep:  fair but still hot flashes       10/28/2023    9:44 AM 10/01/2023   10:14 AM 09/03/2023    8:17 AM 01/06/2023   10:15 AM 09/23/2022   11:03 AM  Depression screen PHQ 2/9  Decreased Interest 0 0 0 0 1  Down, Depressed, Hopeless 0 1 0 0 1  PHQ - 2 Score 0 1 0 0 2  Altered sleeping  1 1 2 2   Tired, decreased energy  1 2 2 2   Change in appetite  0 0 0 0  Feeling bad or failure about yourself   0 0 0 0  Trouble concentrating  1 3 2 3   Moving slowly or fidgety/restless  0 0 0 0  Suicidal thoughts  0 0 0 0  PHQ-9 Score  4 6 6 9   Difficult doing work/chores  Somewhat difficult Very difficult Somewhat difficult Somewhat difficult    Assessment/Plan: MDD (major depressive disorder), recurrent episode, mild (HCC) - Plan: buPROPion  (WELLBUTRIN  XL) 300 MG 24 hr tablet  Anxiety - Plan: buPROPion  (WELLBUTRIN  XL) 300 MG 24 hr tablet  Patient is 48 year old employed married female with history of migraine, heart flashes, sleep apnea, GERD, depressive disorder, anxiety and now recently having vision impairment and had multiple blood test, neuroimaging studies and seeing neurologist, eye specialist.  Discussed anxiety related to her physical health.  Encourage to see therapist regularly.  Reviewed blood work results.  She has appointment to see neuro-ophthalmologist at [facility].  Reassurance given.  Patient does not want to change the medication.  Continue Wellbutrin  XL 300 mg daily.  She is no longer taking Wegovy , estrogen, muscle relaxant, pain medicine.  Discussed medication side effects and benefits.  Recommend to call back if she has any question or any concern.  Patient like to have a follow-up in 3 months.   Follow Up Instructions:     I discussed the assessment and treatment plan with the patient. The patient was provided an opportunity to ask questions and all were answered. The patient agreed with the  plan and demonstrated an understanding of the instructions.   The patient was advised to call back or seek an in-person evaluation if the symptoms worsen or if the condition fails to improve as anticipated.    Collaboration of Care: Other provider involved in patient's care AEB notes are available in epic to review  Patient/Guardian was advised Release of Information must be obtained prior to any record release in order to collaborate their care with an outside provider. Patient/Guardian was advised if they have not already done so to contact the registration department to sign all necessary forms in order for us  to release information regarding their care.   Consent: Patient/Guardian gives verbal consent for treatment and assignment of benefits for services provided during this visit. Patient/Guardian expressed understanding and agreed to proceed.     Total encounter time 28 minutes which includes face-to-face time, chart reviewed, care coordination, order entry and documentation during this encounter.   Note: This document was prepared by [employer] voice dictation technology and any errors that results from this process are unintentional.    Leni ONEIDA Client, MD 01/05/2024

## 2024-01-09 ENCOUNTER — Ambulatory Visit (INDEPENDENT_AMBULATORY_CARE_PROVIDER_SITE_OTHER): Payer: Managed Care, Other (non HMO) | Admitting: Nurse Practitioner

## 2024-01-09 ENCOUNTER — Encounter: Payer: Self-pay | Admitting: Nurse Practitioner

## 2024-01-09 ENCOUNTER — Encounter: Payer: Self-pay | Admitting: Neurology

## 2024-01-09 VITALS — BP 126/74 | HR 62 | Ht 66.0 in | Wt 189.0 lb

## 2024-01-09 DIAGNOSIS — E66811 Obesity, class 1: Secondary | ICD-10-CM | POA: Diagnosis not present

## 2024-01-09 DIAGNOSIS — Z13220 Encounter for screening for lipoid disorders: Secondary | ICD-10-CM

## 2024-01-09 DIAGNOSIS — Z23 Encounter for immunization: Secondary | ICD-10-CM | POA: Diagnosis not present

## 2024-01-09 DIAGNOSIS — Z136 Encounter for screening for cardiovascular disorders: Secondary | ICD-10-CM

## 2024-01-09 DIAGNOSIS — Z0001 Encounter for general adult medical examination with abnormal findings: Secondary | ICD-10-CM

## 2024-01-09 DIAGNOSIS — Z6830 Body mass index (BMI) 30.0-30.9, adult: Secondary | ICD-10-CM

## 2024-01-09 NOTE — Assessment & Plan Note (Addendum)
 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

## 2024-01-09 NOTE — Patient Instructions (Addendum)
 Go to lab Schedule appointment for annual mammogram Maintain Heart healthy diet and daily exercise. Maintain current medications.

## 2024-01-09 NOTE — Progress Notes (Signed)
 Complete physical exam  Patient: Shelby Rios   DOB: 1975-07-12   48 y.o. Female  MRN: [id number] Visit Date: 01/09/2024  Subjective:    Chief Complaint  Patient presents with   Annual Exam    FASTING    Shelby Rios is a 48 y.o. female who presents today for a complete physical exam. She reports consuming a general diet. Walking daily She generally feels well. She reports sleeping well. She does have additional problems to discuss today.  Vision:Yes Dental:Yes STD Screen:No  BP Readings from Last 3 Encounters:  01/09/24 126/74  12/24/23 136/80  11/25/23 (!) 144/86   Wt Readings from Last 3 Encounters:  01/09/24 189 lb (85.7 kg)  11/25/23 184 lb 6.4 oz (83.6 kg)  11/24/23 185 lb 6.4 oz (84.1 kg)   Most recent fall risk assessment:    01/09/2024    8:27 AM  Fall Risk   Falls in the past year? 0  Injury with Fall? 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 pylori infection    HSV-1 infection    oral   Hypertension    only during pregnancy   Migraines    Nonspecific abnormal results of other specified function study    Obesity    Palpitations    Routine general medical examination at a health care facility     Seizures [facility])    couple seisure around age 67- tx with dilantin for a period then d/c   Sleep apnea    c-pap wears   Vitamin B12 deficiency 11/02/2014   Past Surgical History:  Procedure Laterality Date   adenoidectomy     BRCA test     neg   DILATION AND CURETTAGE OF UTERUS     IR LUMBAR PUNCTURE  12/30/2023   PARTIAL HYSTERECTOMY  04/22/2006   endometriosis, done vaginally, she has her ovaries   TONSILLECTOMY     WISDOM TOOTH EXTRACTION     Social History   Socioeconomic History   Marital status: Married    Spouse name: Shelby Rios   Number of children: 2   Years of education: Not on file   Highest education level: Bachelor's degree (e.g., BA, AB, BS)  Occupational History   Occupation: Government social research officer: [employer]  Tobacco Use   Smoking status: Never   Smokeless tobacco: Never   Tobacco comments:    nonsmoker  Vaping Use   Vaping status: Never Used  Substance and Sexual Activity   Alcohol use: No   Drug use: No   Sexual activity: Yes    Partners: Male    Birth control/protection: Surgical    Comment: hysterectomy  Other Topics Concern   Not on file  Social History Narrative   Divorced since 9/08; 2 children   3 coffees/day       Patient is right-handed. She lives with her husband in a one level home. She drinks 4-6 cups of coffee a day, and 1-2 sodas a week. She participates in boot camp 3 x week and hot yoga 3 x a week, each for 1 hour.   Social Drivers of Corporate investment banker Strain: Low Risk  (11/14/2022)   Overall Financial Resource Strain (CARDIA)    Difficulty of Paying Living Expenses: Not hard at all  Food Insecurity: No Food Insecurity (11/14/2022)   Hunger Vital Sign    Worried About Running Out of Food in the Last Year: Never true    Ran Out of Food in the Last Year: Never true  Transportation Needs: No Transportation Needs (11/14/2022)   PRAPARE - Administrator, Civil Service (Medical): No    Lack of Transportation  (Non-Medical): No  Physical Activity: Insufficiently Active (11/14/2022)   Exercise Vital Sign    Days of Exercise per Week: 2 days    Minutes of Exercise per Session: 30 min  Stress: Stress Concern Present (11/14/2022)   [employer] of Occupational Health - Occupational Stress Questionnaire    Feeling of Stress : To some extent  Social Connections: Socially Integrated (11/14/2022)   Social Connection and Isolation Panel    Frequency of Communication with Friends and Family: More than three times a week    Frequency of Social Gatherings with Friends and Family: Once a week    Attends Religious Services: More than 4 times per year    Active Member of [employer] or Organizations: Yes    Attends Engineer, structural: More than 4 times per year    Marital Status: Married  Catering manager Violence: Not on file   Family Status  Relation Name Status   Mother Shelby Rios Alive   Father Shelby Rios Deceased       CABG at 42    Sister Shelby Rios Deceased at age 28       complications from hypokalemia- was getting regular IV K infusions   Mat Aunt  (Not Specified)   Mat Uncle  (Not Specified)   MGM  (Not Specified)   MGF  (Not Specified)   PGF  (Not Specified)   Other  (Not Specified)  No partnership data on file   Family History  Problem Relation Age of Onset   Migraines Mother    Hyperlipidemia Mother    Ovarian cancer Mother    Stroke Mother 59   Diabetes Mother    Seizures Mother    Depression Mother    Hypertension Mother    Obesity Mother    Colon polyps Mother    Irritable bowel syndrome Mother    Esophagitis Mother    Hyperlipidemia Father    Heart disease Father        CABG age 36   Diabetes Father    Depression Father    Early death Father    Hypertension Father    Obesity Father    Hypokalemia Sister        deceased at 20; was getting regular IV K  infusions; long Hx of pseudotumor cerebri   Early death Sister    Migraines Maternal Aunt     Migraines Maternal Uncle    Breast cancer Maternal Grandmother    Heart attack Maternal Grandfather    Heart attack Paternal Grandfather    No Known Allergies  Patient Care Team: Shelby Rios, Shelby Rockford, NP as PCP - General (Internal Medicine) Shelby Richardson, DO as PCP - Cardiology (Cardiology) Skeet Juliene SAUNDERS, DO as Consulting Physician (Neurology) Skeet Juliene SAUNDERS, DO as Consulting Physician (Neurology)   Medications: Outpatient Medications Prior to Visit  Medication Sig   buPROPion  (WELLBUTRIN  XL) 300 MG 24 hr tablet Take 1 tablet (300 mg total) by mouth daily.   cholecalciferol (VITAMIN D3) 25 MCG (1000 UNIT) tablet Take 1,000 Units by mouth daily.   cyclobenzaprine  (FLEXERIL ) 10 MG tablet Take 1 tablet (10 mg total) by mouth 3 (three) times daily as needed for muscle spasms.   ibuprofen (ADVIL) 200 MG tablet Take 200 mg by mouth as needed.   loratadine (CLARITIN) 10 MG tablet Take 10 mg by mouth daily.   MAGNESIUM PO Take 250 mg by mouth. (Patient taking differently: Take 250 mg by mouth at bedtime. MAGNESIUM L-THREONATE 2000)   multivitamin-iron-minerals-folic acid (CENTRUM) chewable tablet Chew 1 tablet by mouth daily.   pantoprazole  (PROTONIX ) 20 MG tablet TAKE 1 TABLET BY MOUTH DAILY AS  NEEDED   naproxen  (NAPROSYN ) 500 MG tablet Take 1 tablet (500 mg total) by mouth 2 (two) times daily with a meal.   [DISCONTINUED] estradiol  (VIVELLE -DOT) 0.025 MG/24HR PLACE 1 PATCH ONTO THE SKIN 2 TIMES A WEEK.   [DISCONTINUED] HYDROcodone -acetaminophen  (NORCO) 10-325 MG tablet Take 1 tablet by mouth every 8 (eight) hours as needed (Use prior to procedure on 11/12/2023).   [DISCONTINUED] Semaglutide -Weight Management (WEGOVY ) 2.4 MG/0.75ML SOAJ Inject 2.4 mg into the skin. Provided by weight wat[facility]   No facility-administered medications prior to visit.    Review of Systems  Constitutional:  Negative for activity change, appetite change and unexpected weight change.  Respiratory: Negative.     Cardiovascular: Negative.   Gastrointestinal: Negative.   Endocrine: Negative for cold intolerance and heat intolerance.  Genitourinary: Negative.   Musculoskeletal: Negative.   Skin: Negative.   Neurological: Negative.   Hematological: Negative.   Psychiatric/Behavioral:  Negative for behavioral problems, decreased concentration, dysphoric mood, hallucinations, self-injury, sleep disturbance and suicidal ideas. The patient is not nervous/anxious.         Objective:  BP 126/74 (BP Location: Left Arm, Patient Position: Sitting)   Pulse 62   Ht 5' 6 (1.676 m)   Wt 189 lb (85.7 kg)   LMP 07/22/2006   SpO2 98%   BMI 30.51 kg/m     Physical Exam Vitals and nursing note reviewed.  Constitutional:      General: She is not in acute distress. HENT:     Right Ear: Tympanic membrane, ear canal and external ear normal.     Left Ear: Tympanic membrane, ear canal and external ear normal.     Nose: Nose normal.  Eyes:     Extraocular Movements: Extraocular movements intact.     Conjunctiva/sclera: Conjunctivae normal.     Pupils: Pupils are equal, round, and reactive to light.  Neck:     Thyroid : No thyroid  mass, thyromegaly or thyroid  tenderness.  Cardiovascular:     Rate and Rhythm: Normal rate and regular rhythm.     Pulses: Normal pulses.     Heart sounds: Normal heart sounds.  Pulmonary:     Effort: Pulmonary effort is normal.     Breath sounds: Normal breath sounds.  Abdominal:     General: Bowel sounds are normal.     Palpations: Abdomen is soft.  Musculoskeletal:        General: Normal range of motion.     Cervical back: Normal range of motion and neck supple.     Right lower leg: No edema.     Left lower leg: No edema.  Lymphadenopathy:     Cervical: No cervical adenopathy.  Skin:    General: Skin is warm and dry.  Neurological:     Mental Status: She is alert and oriented to person, place, and time.     Cranial Nerves: No cranial nerve deficit.  Psychiatric:         Mood and Affect: Mood normal.        Behavior: Behavior normal.        Thought Content: Thought content normal.      No results found for any visits on 01/09/24.    Assessment & Plan:    Routine Health Maintenance and Physical Exam  Immunization History  Administered Date(s) Administered   Hepatitis B, ADULT 09/17/2013, 10/15/2013, 03/14/2014   Influenza Whole 02/05/2010   Influenza, Seasonal, Injecte, Preservative Fre 01/06/2023, 01/09/2024   Influenza,inj,Quad PF,6+ Mos 03/04/2013, 02/10/2014, 01/12/2015, 01/04/2016, 01/13/2018, 12/21/2018, 01/01/2021, 01/03/2022   Influenza-Unspecified 01/12/2020, 01/01/2021, 01/03/2022   Moderna Sars-Covid-2 Vaccination 05/28/2019, 06/25/2019   PFIZER Comirnaty(Gray Top)Covid-19 Tri-Sucrose Vaccine 10/06/2020   PPD Test 09/17/2013, 10/15/2013   Pfizer Covid-19 Vaccine Bivalent Booster 63yrs & up 04/25/2021, 03/20/2022   Pfizer(Comirnaty)Fall Seasonal Vaccine 12 years and older 03/20/2022, 04/25/2023   Tdap 11/12/2017, 03/20/2022    Health Maintenance  Topic Date Due   Hepatitis C Screening  01/08/2025 (Originally 09[birth year]/1993)   Mammogram  02/12/2025   Colonoscopy  03/10/2031   DTaP/Tdap/Td (3 - Td or Tdap) 03/20/2032   Influenza Vaccine  Completed   Hepatitis B Vaccines 19-59 Average Risk  Completed   COVID-19 Vaccine  Completed   HIV Screening  Completed   Pneumococcal Vaccine  Aged Out   HPV VACCINES  Aged Out   Meningococcal B Vaccine  Aged Out    Discussed health benefits of physical activity, and encouraged her to engage in regular exercise appropriate for her age and condition.  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 She has mainatined daily exercise and heart healthy diet so far Wt Readings from Last 3 Encounters:  01/09/24 189 lb (85.7 kg)  11/25/23 184 lb 6.4 oz (83.6 kg)  11/24/23 185 lb 6.4 oz (84.1 kg)         Other Visit Diagnoses        Encounter for preventative adult health care exam with abnormal findings    -  Primary   Relevant Orders   Lipid panel   Comprehensive metabolic panel with GFR     Need for influenza vaccination       Relevant Orders   Flu vaccine trivalent PF, 6mos and older(Flulaval,Afluria,Fluarix,Fluzone) (Completed)     Encounter for lipid screening for cardiovascular disease       Relevant Orders   Lipid panel      Return in about 1 year (around 01/08/2025) for CPE (fasting).     Shelby Mood, NP

## 2024-01-10 LAB — COMPREHENSIVE METABOLIC PANEL WITH GFR
ALT: 17 IU/L (ref 0–32)
AST: 18 IU/L (ref 0–40)
Albumin: 4 g/dL (ref 3.9–4.9)
Alkaline Phosphatase: 69 IU/L (ref 41–116)
BUN/Creatinine Ratio: 20 (ref 9–23)
BUN: 15 mg/dL (ref 6–24)
Bilirubin Total: 0.2 mg/dL (ref 0.0–1.2)
CO2: 23 mmol/L (ref 20–29)
Calcium: 8.7 mg/dL (ref 8.7–10.2)
Chloride: 102 mmol/L (ref 96–106)
Creatinine, Ser: 0.76 mg/dL (ref 0.57–1.00)
Globulin, Total: 2.7 g/dL (ref 1.5–4.5)
Glucose: 92 mg/dL (ref 70–99)
Potassium: 4.2 mmol/L (ref 3.5–5.2)
Sodium: 138 mmol/L (ref 134–144)
Total Protein: 6.7 g/dL (ref 6.0–8.5)
eGFR: 97 mL/min/1.73 (ref >59)

## 2024-01-10 LAB — LIPID PANEL
Chol/HDL Ratio: 3.1 ratio (ref 0.0–4.4)
Cholesterol, Total: 185 mg/dL (ref 100–199)
HDL: 60 mg/dL (ref >39)
LDL Chol Calc (NIH): 117 mg/dL — ABNORMAL HIGH (ref 0–99)
Triglycerides: 39 mg/dL (ref 0–149)
VLDL Cholesterol Cal: 8 mg/dL (ref 5–40)

## 2024-01-11 LAB — VITAMIN A: Vitamin A: 41.8 ug/dL (ref 20.1–62.0)

## 2024-01-12 ENCOUNTER — Ambulatory Visit: Admission: EM | Admit: 2024-01-12 | Discharge: 2024-01-12 | Discharge status: Against Medical Advice

## 2024-01-12 ENCOUNTER — Encounter: Payer: Self-pay | Admitting: Nurse Practitioner

## 2024-01-12 ENCOUNTER — Ambulatory Visit: Payer: Self-pay | Admitting: Nurse Practitioner

## 2024-01-12 ENCOUNTER — Ambulatory Visit (INDEPENDENT_AMBULATORY_CARE_PROVIDER_SITE_OTHER): Admitting: Licensed Clinical Social Worker

## 2024-01-12 ENCOUNTER — Ambulatory Visit: Payer: Self-pay | Admitting: Neurology

## 2024-01-12 DIAGNOSIS — F419 Anxiety disorder, unspecified: Secondary | ICD-10-CM

## 2024-01-12 DIAGNOSIS — F330 Major depressive disorder, recurrent, mild: Secondary | ICD-10-CM

## 2024-01-12 NOTE — Progress Notes (Unsigned)
 THERAPIST PROGRESS NOTE   Session Date: 01/12/2024  Session Time: 8397 - 1717  Participation Level: Active  Behavioral Response: CasualAlertEuthymic  Type of Therapy: Individual Therapy  Treatment Goals addressed:   Initial (6) LTG: Increase coping skills to manage depression and improve ability to perform daily activities (OP Depression) STG: Reduce overall depression score by a minimum of 25% on the Patient Health Questionnaire (PHQ-9) (OP Depression) STG: Shelby Rios will identify cognitive patterns and beliefs that support depression (OP Depression) STG: Shelby Rios will reduce frequency of avoidant behaviors by 50% as evidenced by self-report in therapy sessions (OP Depression) STG: Shelby Rios will practice behavioral activation skills 3x per week for the next 24 weeks (OP Depression) LTG: Setting healthy boundaries, recognize I can't fix other people's problems, so that I'm not using all of my energy to try helping someone else (OP Depression)  Progress Towards Goals: Progressing  Interventions: CBT, Motivational Interviewing, Solution Focused, Strength-based, and Supportive  Summary: Shelby Rios is a 48 y.o. female with psych history of MDD and Anxiety, presenting for follow-up therapy session in efforts to improve management of depressive symptoms.   Patient actively engaged in session, presenting in pleasant moods, and congruent affect throughout duration of visit. Patient openly engaged in introductory check-in, sharing of I had the spinal tap and am just waiting on the referral to [facility] now, further detailing of today's presenting stressors, having observed what appeared to be hives covering her entire rt side, having presented to Urgent Care, and scheduled to follow up with PCP tomorrow. Actively engaged in exploration of potential variances in environmental factors, routine, or stress levels proving to impact pt, potentially causing rash. Processed impact of stress on physical health, leading to  compromised immune system, as a result of varying factors including poor sleep, nutrition, hygiene, etc. Pt noted of having experienced increased sleep challenges for 3 nights over the past two weeks, waking around 0230 with difficulties getting back to sleep, as well as nightly hot flashes, not experiencing any other concerns. Pt detailed recent med man visit with Arfeen, MD, reporting of no med changes. Processed implications of stress on day-to-day functioning and occurrence of such without being consciously aware. Explored benefits of mindfulness and grounding exercises to increase self awareness and experienced sxs. Further explored experienced mild anxiousness during recent church related tasks, processing pt's feelings surrounding imposter syndrome, as well as factors contributing individuals self-worth, self-esteem, and confidence in self, based on childhood experiences contributing to overall view of self. Processed importance of increasing utilization of techniques to support in challenging irrational thoughts to begin incorporating into future visits.  Patient responded well to interventions. Patient continues to meet criteria for MDD and Anxiety. Patient will continue to benefit from engagement in outpatient therapy due to being the least restrictive service to meet presenting needs.       01/09/2024    8:27 AM 10/28/2023    9:44 AM 10/01/2023   10:14 AM 09/03/2023    8:17 AM 01/06/2023   10:15 AM  Depression screen PHQ 2/9  Decreased Interest 0 0 0 0 0  Down, Depressed, Hopeless 1 0 1 0 0  PHQ - 2 Score 1 0 1 0 0  Altered sleeping 1  1 1 2   Tired, decreased energy 1  1 2 2   Change in appetite 0  0 0 0  Feeling bad or failure about yourself  0  0 0 0  Trouble concentrating 0  1 3 2   Moving slowly or fidgety/restless 0  0  0 0  Suicidal thoughts 0  0 0 0  PHQ-9 Score 3  4 6 6   Difficult doing work/chores Somewhat difficult  Somewhat difficult Very difficult Somewhat difficult    Flowsheet Row ED from 11/23/2023 in [facility] Emergency Department at [facility] ED from 10/27/2023 in [facility] Emergency Department at [facility] Counselor from 09/03/2023 in [facility] at [facility] RISK CATEGORY No Risk No Risk No Risk   Suicidal/Homicidal: Nowithout intent/plan  Therapist Response: Clinician utilized CBT, MI, Solution focused, strengths based, and supportive reflection interventions to support patient in efforts to address presenting sxs and challenges surrounding presenting stressors.  Clinician openly greeted pt upon joining virtual visit, assessing presenting moods and affect, actively engaging patient in introductory check-in, utilizing open ended questions in eliciting recounts of the events of the past 2 weeks, continued efforts and abilities of navigating ongoing stressors and any presence of newly identified difficulties, and factors contributing to presenting moods. Utilized active listening techniques to provide support and validation of patient's shared thoughts, feelings, and perspectives in relation to continued medical challenges and newly experienced sxs. Utilized CBT, MI, psychoed, and supportive reflection interventions to aid pt in navigating thoughts and feelings in relation to recent stressors. Patient proves to maintain mild progress towards identified treatment goals.  Plan: Return again in 2 weeks. Begin exploration of thought patterns, relationship between thoughts, feelings, behavioral responses/actions, challenging negative thoughts, anxious thoughts, and cognitive distortions.   Diagnosis:  Encounter Diagnoses  Name Primary?   MDD (major depressive disorder), recurrent episode, mild (HCC) Yes   Anxiety      Collaboration of Care: Psychiatrist AEB provider documentation available in EHR.  Patient/Guardian was advised Release of Information must be obtained prior to any record release in  order to collaborate their care with an outside provider. Patient/Guardian was advised if they have not already done so to contact the registration department to sign all necessary forms in order for us  to release information regarding their care.   Consent: Patient/Guardian gives verbal consent for treatment and assignment of benefits for services provided during this visit. Patient/Guardian expressed understanding and agreed to proceed.   Virtual Visit via Video Note  I connected with Shelby Rios on 01/12/24 at  4:00 PM EDT by a video enabled telemedicine application and verified that I am speaking with the correct person using two identifiers.  Location: Patient: Home Provider: Home Office   I discussed the limitations of evaluation and management by telemedicine and the availability of in person appointments. The patient expressed understanding and agreed to proceed.  I discussed the assessment and treatment plan with the patient. The patient was provided an opportunity to ask questions and all were answered. The patient agreed with the plan and demonstrated an understanding of the instructions.   The patient was advised to call back or seek an in-person evaluation if the symptoms worsen or if the condition fails to improve as anticipated.  I provided 75 minutes of non-face-to-face time during this encounter.  Lynwood JONETTA Maris, MSW, LCSW 01/12/2024,  4:04 PM

## 2024-01-12 NOTE — Telephone Encounter (Signed)
 Called patient and informed her of Charlotte's comments/recommendations of getting scheduled for an appointment with her for tomorrow and to take benadryl  25mg  tab. Patient is scheduled for an appointment. Patient verbalized understanding and all (if any) questions were answered.

## 2024-01-13 ENCOUNTER — Ambulatory Visit: Admitting: Nurse Practitioner

## 2024-01-13 ENCOUNTER — Encounter: Payer: Self-pay | Admitting: Nurse Practitioner

## 2024-01-13 ENCOUNTER — Encounter: Payer: Self-pay | Admitting: Neurology

## 2024-01-13 VITALS — BP 126/72 | HR 91 | Ht 66.0 in | Wt 189.4 lb

## 2024-01-13 DIAGNOSIS — B029 Zoster without complications: Secondary | ICD-10-CM

## 2024-01-13 MED ORDER — VALACYCLOVIR HCL 1 G PO TABS: 1000.0000 mg | ORAL_TABLET | Freq: Three times a day (TID) | ORAL | 0 refills | Status: AC

## 2024-01-13 MED ORDER — HYDROXYZINE PAMOATE 25 MG PO CAPS: 25.0000 mg | ORAL_CAPSULE | Freq: Three times a day (TID) | ORAL | 0 refills | Status: AC | PRN

## 2024-01-13 NOTE — Patient Instructions (Signed)
 Ok to continue use of calamine lotion or topical benadryl  for itching. Call office if no improvement in 10days.  Shingles  Shingles, or herpes zoster, is an infection. It gives you a skin rash and blisters. These infected areas may hurt a lot. Shingles only happens if: You've had chickenpox. You've been given a shot called a vaccine to protect you from getting chickenpox. Shingles is rare in this case. What are the causes? Shingles is caused by a germ called the varicella-zoster virus. This is the same germ that causes chickenpox. After you're exposed to the germ, it stays in your body but is dormant. This means it isn't active. Shingles happens if the germ becomes active again. This can happen years after you're first exposed to the germ. What increases the risk? You may be more likely to get shingles if: You're older than 48 years of age. You're under a lot of stress. You have a weak immune system. The immune system is your body's defense system. It may be weak if: You have human immunodeficiency virus (HIV). You have acquired immunodeficiency syndrome (AIDS). You have cancer. You take medicines that weaken your immune system. These include organ transplant medicines. What are the signs or symptoms? The first symptoms of shingles may be itching, tingling, or pain. Your skin may feel like it's burning. A few days or weeks later, you'll get a rash. Here's what you can expect: The rash is likely to be on one side of your body. The rash may be shaped like a belt or a band. Over time, it will turn into blisters filled with fluid. The blisters will break open and change into scabs. The scabs will dry up in about 2-3 weeks. You may also have: A fever. Chills. A headache. Nausea. How is this diagnosed? Shingles is diagnosed with a skin exam. A sample called a culture may be taken from one of your blisters and sent to a lab. This will show if you have shingles. How is this treated? The  rash may last for several weeks. There's no cure for shingles, but your health care provider may give you medicines. These medicines may: Help with pain. Help with itching. Help with irritation and swelling. Help you get better sooner. Help to prevent long-term problems. If the rash is on your face, you may need to see an eye doctor or an ear, nose, and throat (ENT) doctor. Follow these instructions at home: Medicines Take your medicines only as told by your provider. Put an anti-itch cream or numbing cream on the rash or blisters as told by your provider. Relieving itching and discomfort  To help with itching: Put cold, wet cloths called cold compresses on the rash or blisters. Take a cool bath. Try adding baking soda or dry oatmeal to the water. Do not bathe in hot water. Use calamine lotion on the rash or blisters. You can get this type of lotion at the store. Blister and rash care Keep your rash covered with a loose bandage. Wear loose clothes that don't rub on your rash. Take care of your rash as told by your provider. Make sure you: Wash your hands with soap and water for at least 20 seconds before and after you change your bandage. If you can't use soap and water, use hand sanitizer. Keep your rash and blisters clean by washing them with mild soap and cool water. Change your bandage. Check your rash every day for signs of infection. Check for: More redness, swelling, or  pain. Fluid or blood. Warmth. Pus or a bad smell. Do not scratch your rash. Do not pick at your blisters. To help you not scratch: Keep your fingernails clean and cut short. Try to wear gloves or mittens when you sleep. General instructions Rest. Wash your hands often with soap and water for at least 20 seconds. If you can't use soap and water, use hand sanitizer. Washing your hands lowers your chance of getting a skin infection. Your infection can cause chickenpox in others. If you have blisters that aren't  scabs yet, stay away from: Babies. Pregnant people. Children who have eczema. Older people who have organ transplants. People who have a long-term, or chronic, illness. Anyone who hasn't had chickenpox before. Anyone who hasn't gotten the chickenpox vaccine. How is this prevented? Vaccines are the best way to prevent you from getting chickenpox or shingles. Talk with your provider about getting these shots. Where to find more information Centers for Disease Control and Prevention (CDC): TonerProm[employer].no Contact a health care provider if: Your pain doesn't get better with medicine. Your pain doesn't get better after the rash heals. You have any signs of infection around the rash. Your rash or blisters get worse. You have a fever or chills. Get help right away if: The rash is on your face or nose. You have pain in your face or by your eye. You lose feeling on one side of your face. You have trouble seeing. You have ear pain or ringing in your ear. This information is not intended to replace advice given to you by your health care provider. Make sure you discuss any questions you have with your health care provider. Document Revised: 01/09/2023 Document Reviewed: 05/24/2022 Elsevier Patient Education  2024 [employer].

## 2024-01-13 NOTE — Progress Notes (Signed)
 Acute Office Visit  Subjective:    Patient ID: Shelby Rios, female    DOB: Sep 28, 1975, 47 y.o.   MRN: [id number]  Chief Complaint  Patient presents with   Rash    On right on and chest     Rash This is a new problem. The current episode started in the past 7 days (2days ago). The problem is unchanged. The affected locations include the chest, torso and right arm (right side of chest and torso). The rash is characterized by blistering, burning, redness and itchiness. She was exposed to nothing. Pertinent negatives include no facial edema, fatigue, fever, joint pain or shortness of breath. Past treatments include antihistamine, anti-itch cream and topical steroids. The treatment provided mild relief. Her past medical history is significant for varicella. There is no history of allergies, asthma or eczema.  Influenza vaccine was administered in Left arm 4days ago  Outpatient Medications Prior to Visit  Medication Sig   buPROPion  (WELLBUTRIN  XL) 300 MG 24 hr tablet Take 1 tablet (300 mg total) by mouth daily.   cholecalciferol (VITAMIN D3) 25 MCG (1000 UNIT) tablet Take 1,000 Units by mouth daily.   cyclobenzaprine  (FLEXERIL ) 10 MG tablet Take 1 tablet (10 mg total) by mouth 3 (three) times daily as needed for muscle spasms.   ibuprofen (ADVIL) 200 MG tablet Take 200 mg by mouth as needed.   loratadine (CLARITIN) 10 MG tablet Take 10 mg by mouth daily.   MAGNESIUM PO Take 250 mg by mouth. (Patient taking differently: Take 250 mg by mouth at bedtime. MAGNESIUM L-THREONATE 2000)   multivitamin-iron-minerals-folic acid (CENTRUM) chewable tablet Chew 1 tablet by mouth daily.   naproxen  (NAPROSYN ) 500 MG tablet Take 1 tablet (500 mg total) by mouth 2 (two) times daily with a meal.   pantoprazole  (PROTONIX ) 20 MG tablet TAKE 1 TABLET BY MOUTH DAILY AS  NEEDED   No facility-administered medications prior to visit.    Reviewed past medical and social history.  Review of Systems   Constitutional:  Negative for fatigue and fever.  Respiratory:  Negative for shortness of breath.   Musculoskeletal:  Negative for joint pain.  Skin:  Positive for rash.   Per HPI     Objective:    Physical Exam Vitals and nursing note reviewed.  Lymphadenopathy:     Upper Body:     Right upper body: No supraclavicular, axillary or pectoral adenopathy.  Skin:    Findings: Erythema and rash present. Rash is vesicular.      Neurological:     Mental Status: She is alert.     BP 126/72 (BP Location: Left Arm, Patient Position: Sitting, Cuff Size: Large)   Pulse 91   Ht 5' 6 (1.676 m)   Wt 189 lb 6.4 oz (85.9 kg)   LMP 07/22/2006   SpO2 98%   BMI 30.57 kg/m    No results found for any visits on 01/13/24.      Assessment & Plan:   Problem List Items Addressed This Visit   None Visit Diagnoses       Herpes zoster without complication    -  Primary   Relevant Medications   valACYclovir  (VALTREX ) 1000 MG tablet   hydrOXYzine  (VISTARIL ) 25 MG capsule      Meds ordered this encounter  Medications   valACYclovir  (VALTREX ) 1000 MG tablet    Sig: Take 1 tablet (1,000 mg total) by mouth 3 (three) times daily for 10 days.    Dispense:  30 tablet    Refill:  0    Supervising Provider:   BERNETA FALLOW ALFRED [[id number]]   hydrOXYzine  (VISTARIL ) 25 MG capsule    Sig: Take 1 capsule (25 mg total) by mouth every 8 (eight) hours as needed.    Dispense:  30 capsule    Refill:  0    Supervising Provider:   BERNETA FALLOW SAYRE [[id number]]   Return if symptoms worsen or fail to improve.  Roselie Mood, NP

## 2024-01-14 ENCOUNTER — Ambulatory Visit: Admitting: Nurse Practitioner

## 2024-01-20 ENCOUNTER — Encounter: Payer: Self-pay | Admitting: Nurse Practitioner

## 2024-01-20 DIAGNOSIS — L309 Dermatitis, unspecified: Secondary | ICD-10-CM

## 2024-01-20 MED ORDER — METHYLPREDNISOLONE 4 MG PO TBPK: ORAL_TABLET | ORAL | 0 refills | Status: DC

## 2024-01-26 ENCOUNTER — Ambulatory Visit: Admitting: Dermatology

## 2024-01-26 ENCOUNTER — Encounter: Payer: Self-pay | Admitting: Dermatology

## 2024-01-26 ENCOUNTER — Ambulatory Visit (INDEPENDENT_AMBULATORY_CARE_PROVIDER_SITE_OTHER): Admitting: Licensed Clinical Social Worker

## 2024-01-26 VITALS — BP 128/87 | HR 76

## 2024-01-26 DIAGNOSIS — F330 Major depressive disorder, recurrent, mild: Secondary | ICD-10-CM

## 2024-01-26 DIAGNOSIS — F419 Anxiety disorder, unspecified: Secondary | ICD-10-CM | POA: Diagnosis not present

## 2024-01-26 DIAGNOSIS — L309 Dermatitis, unspecified: Secondary | ICD-10-CM | POA: Diagnosis not present

## 2024-01-26 DIAGNOSIS — L819 Disorder of pigmentation, unspecified: Secondary | ICD-10-CM | POA: Diagnosis not present

## 2024-01-26 MED ORDER — CLOBETASOL PROPIONATE 0.05 % EX OINT: TOPICAL_OINTMENT | Freq: Two times a day (BID) | CUTANEOUS | 6 refills | Status: DC

## 2024-01-26 NOTE — Progress Notes (Unsigned)
 THERAPIST PROGRESS NOTE   Session Date: 01/26/2024  Session Time: 1602 - 1733  Participation Level: Active  Behavioral Response: CasualAlertEuthymic  Type of Therapy: Individual Therapy  Treatment Goals addressed:   Initial (6) LTG: Increase coping skills to manage depression and improve ability to perform daily activities (OP Depression) STG: Reduce overall depression score by a minimum of 25% on the Patient Health Questionnaire (PHQ-9) (OP Depression) STG: Shelby Rios will identify cognitive patterns and beliefs that support depression (OP Depression) STG: Shelby Rios will reduce frequency of avoidant behaviors by 50% as evidenced by self-report in therapy sessions (OP Depression) STG: Shelby Rios will practice behavioral activation skills 3x per week for the next 24 weeks (OP Depression) LTG: Setting healthy boundaries, recognize I can't fix other people's problems, so that I'm not using all of my energy to try helping someone else (OP Depression)  Progress Towards Goals: Progressing  Interventions: CBT, Motivational Interviewing, Solution Focused, Strength-based, and Supportive  Summary: Shelby Rios is a 48 y.o. female with psych history of MDD and Anxiety, presenting for follow-up therapy session in efforts to improve management of depressive symptoms.   Patient actively engaged in session, presenting in pleasant moods, and congruent affect throughout duration of visit. 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 Pt shared of efforts in navigating  previously identified stress noted in previous visit in relation to rash, sharing of having went to PCP and received dx of rash being shingles, worsening over the past two weeks, resulting in referral to [facility] today, learning of not being shingles but more environmental being dx as Dermatitis. Pt identified added stress and sxs observed in relation to medical dx, sharing of having increased difficulties with sleep and discomfort. Pt detailed of [facility] referral being dated out into 06/2024, resulting in alternate referral to [facility] scheduled for tomorrow for initial consult, sharing of feeling reassured by securing earlier appt and optimism to obtain support and direction regarding concerns. Actively began exploration of Cognitive Triangle and Challenging Negative Thoughts to further exploration of relationship between thoughts, feelings, and behaviors, and individuals abilities at challenging negative, irrational thoughts, and reframing perspectives.     01/09/2024    8:27 AM 10/28/2023    9:44 AM 10/01/2023   10:14 AM 09/03/2023    8:17 AM 01/06/2023   10:15 AM  Depression screen PHQ 2/9  Decreased Interest 0 0 0 0 0  Down, Depressed, Hopeless 1 0 1 0 0  PHQ - 2 Score 1 0 1 0 0  Altered sleeping 1  1 1 2   Tired, decreased energy 1  1 2 2   Change in appetite 0  0 0 0  Feeling bad or failure about yourself  0  0 0 0  Trouble concentrating 0  1 3 2   Moving slowly or fidgety/restless 0  0 0 0  Suicidal thoughts 0  0 0 0  PHQ-9 Score 3  4 6 6   Difficult doing work/chores Somewhat difficult  Somewhat difficult Very difficult Somewhat difficult   Flowsheet Row ED from 11/23/2023 in [facility] Department at [facility] ED from 10/27/2023 in [facility] Emergency Department at [facility]  [facility] Counselor from 09/03/2023 in [facility] at [facility] RISK CATEGORY No Risk No Risk No Risk   Suicidal/Homicidal: Nowithout  intent/plan  Therapist Response: Clinician utilized CBT, MI, Solution focused, strengths based, and supportive reflection interventions to support patient in efforts to address presenting sxs and challenges surrounding presenting stressors.  Clinician openly greeted pt upon joining virtual visit, assessing presenting moods and affect, actively engaging patient in introductory check-in, exploring daily events and presenting moods. Utilized open ended questions in eliciting recounts of events of the past 2 weeks, exploring newly identified stressors, recurring stressors, impact on presenting moods, and individual efforts at navigating presenting challenges. Utilized active listening techniques to provide support and validation of patient's shared thoughts, feelings, and perspectives in relation to continued medical challenges and newly experienced sxs. Utilized CBT, MI, psychoed, and supportive reflection interventions to aid pt in navigating thoughts and feelings in relation to recent stressors.   [x]  Cognitive Challenging []  Cognitive Refocusing [x]  Cognitive Reframing  []  Communication Skills []  Compliance Issues []  DBT [x]  Exploration of Coping Patterns [x]  Exploration of Emotions [x]  Exploration of Relationship Patterns []  Guided Imagery []  Interactive Feedback []  Interpersonal Resolutions []  Mindfulness Training []  Preventative Services [x]  Psycho-Education  []  Relaxation/Deep Breathing []  Review of Treatment Plan/Progress []  Role-Play/Behavioral Rehearsal  []  Structured Problem Solving [x]  Supportive Reflection []  Symptom Management  []  Other    Patient responded well to interventions. Patient continues to meet criteria for MDD and Anxiety. Patient will continue to benefit from engagement in outpatient therapy due to being the least restrictive service to meet presenting needs. Patient proves to maintain mild progress towards identified treatment goals.  Plan: Return again in 2 weeks. Continue  exploration of thought patterns, relationship between thoughts, feelings, behavioral responses/actions, challenging negative thoughts, anxious thoughts, and cognitive distortions.   Diagnosis:  Encounter Diagnoses  Name Primary?   MDD (major depressive disorder), recurrent episode, mild Yes   Anxiety      Collaboration of Care: Psychiatrist AEB provider documentation available in EHR.  Patient/Guardian was advised Release of Information must be obtained prior to any record release in order to collaborate their care with an outside provider. Patient/Guardian was advised if they have not already done so to contact the registration department to sign all necessary forms in order for us  to release information regarding their care.   Consent: Patient/Guardian gives verbal consent for treatment and assignment of benefits for services provided during this visit. Patient/Guardian expressed understanding and agreed to proceed.   Virtual Visit via Video Note  I connected with Shelby Rios on 01/26/24 at  4:00 PM EDT by a video enabled telemedicine application and verified that I am speaking with the correct person using two identifiers.  Location: Patient: Home Provider: Home Office   I discussed the limitations of evaluation and management by telemedicine and the availability of in person appointments. The patient expressed understanding and agreed to proceed.  I discussed the assessment and treatment plan with the patient. The patient was provided an opportunity to ask questions and all were answered. The patient agreed with the plan and demonstrated an understanding of the instructions.   The patient was advised to call back or seek an in-person evaluation if the symptoms worsen or if the condition fails to improve as anticipated.  I provided 91 minutes of non-face-to-face time during this encounter.  Lynwood JONETTA Maris, MSW, LCSW 01/26/2024,  4:04 PM

## 2024-01-26 NOTE — Patient Instructions (Addendum)
 VISIT SUMMARY:  Today, we discussed the widespread itchy rash you have been experiencing. The rash started on your arm and spread to other parts of your body. Initial treatments included a Medrol  Dosepak, topical steroids, and calamine lotion, which have helped reduce the itchiness. We ruled out shingles as the cause of the rash and considered other possibilities such as bug bites, contact dermatitis, and eczema.  YOUR PLAN:  -DERMATITIS, UNSPECIFIED:  Dermatitis is a general term for skin inflammation that can cause a rash. We have ruled out shingles and are considering other causes like bug bites, contact dermatitis, and eczema.  +You will use clobetasol cream twice daily for two weeks in any remaining itchy areas (or any new spots if the occur) to help reduce the inflammation.  +Please take pictures of any new rash and send them for evaluation.  +We also provided samples of Eucerin Advanced Repair and La Roche-Posay Lipikar for moisturizing your skin. Remember to moisturize within ten minutes of showering to help maintain your skin barrier.  INSTRUCTIONS:  Please follow up if the rash does not improve or if you notice any new symptoms. Take pictures of any new rash and send them for evaluation.    Important Information  Due to recent changes in healthcare laws, you may see results of your pathology and/or laboratory studies on MyChart before the doctors have had a chance to review them. We understand that in some cases there may be results that are confusing or concerning to you. Please understand that not all results are received at the same time and often the doctors may need to interpret multiple results in order to provide you with the best plan of care or course of treatment. Therefore, we ask that you please give us  2 business days to thoroughly review all your results before contacting the office for clarification. Should we see a critical lab result, you will be contacted  sooner.   If You Need Anything After Your Visit  If you have any questions or concerns for your doctor, please call our main line at [phone] If no one answers, please leave a voicemail as directed and we will return your call as soon as possible. Messages left after 4 pm will be answered the following business day.   You may also send us  a message via MyChart. We typically respond to MyChart messages within 1-2 business days.  For prescription refills, please ask your pharmacy to contact our office. Our fax number is [phone].  If you have an urgent issue when the clinic is closed that cannot wait until the next business day, you can page your doctor at the number below.    Please note that while we do our best to be available for urgent issues outside of office hours, we are not available 24/7.   If you have an urgent issue and are unable to reach us , you may choose to seek medical care at your doctor's office, retail clinic, urgent care center, or emergency room.  If you have a medical emergency, please immediately call 911 or go to the emergency department. In the event of inclement weather, please call our main line at [phone] for an update on the status of any delays or closures.  Dermatology Medication Tips: Please keep the boxes that topical medications come in in order to help keep track of the instructions about where and how to use these. Pharmacies typically print the medication instructions only on the boxes and not directly on  the medication tubes.   If your medication is too expensive, please contact our office at [phone] or send us  a message through MyChart.   We are unable to tell what your co-pay for medications will be in advance as this is different depending on your insurance coverage. However, we may be able to find a substitute medication at lower cost or fill out paperwork to get insurance to cover a needed medication.   If a prior authorization is  required to get your medication covered by your insurance company, please allow us  1-2 business days to complete this process.  Drug prices often vary depending on where the prescription is filled and some pharmacies may offer cheaper prices.  The website www.goodrx.com contains coupons for medications through different pharmacies. The prices here do not account for what the cost may be with help from insurance (it may be cheaper with your insurance), but the website can give you the price if you did not use any insurance.  - You can print the associated coupon and take it with your prescription to the pharmacy.  - You may also stop by our office during regular business hours and pick up a GoodRx coupon card.  - If you need your prescription sent electronically to a different pharmacy, notify our office through [facility] or by phone at [phone]

## 2024-01-26 NOTE — Progress Notes (Signed)
 New Patient Visit   Subjective  Shelby Rios is a 48 y.o. female who presents for the following: dermatitis  Patient states she has rash located at the right arm, right side of chest/breast, bilateral inner and outer thighs and right side of back. that she would like to have examined. Patient reports the areas have been there for 2 weeks. She reports the areas are bothersome.Patient reports that it has not been painful but very itchy. Patient rates irritation 2 out of 10. She states that the areas have spread. Patient reports she has previously been treated for these areas. Patient reports using a calamine lotion and hydrocortisone cream before seeing her PCP. Patient reports that she saw her primary care physician and was given a diagnosis of shingles and was prescribed Methylprednisolone  4mg , hydroxyzine  25mg  and valacyclovir . Patient reports that the itch did decrease after using prescribed medication. Patient reports she does not have food allergies and has not recently changed her diet. Patient has Hx of bx with diagnosis of wart, or benign moles not skin cancer. Patient reports family history of skin cancer(s). Father had skin cancer believes it was melanoma. Patient reports loss of vision 3 months ago in left eye. Patient reports the vision is extremely blurry and is having issues seeing shadows. She does have an appointment set up with an opthlamologist tomorrow. Patient reports that she was previously on blood pressure medication but was taken off of that in April.  The patient has spots, moles and lesions to be evaluated, some may be new or changing and the patient may have concern these could be cancer.   The following portions of the chart were reviewed this encounter and updated as appropriate: medications, allergies, medical history  Review of Systems:  No other skin or systemic complaints except as noted in HPI or Assessment and Plan.  Objective  Well appearing patient in no  apparent distress; mood and affect are within normal limits.   A focused examination was performed of the following areas: Right arm, right side of chest/breast, bilateral inner and outer thighs and right side of back.  Relevant exam findings are noted in the Assessment and Plan.                               Assessment & Plan    Dermatitis, unspecified with Post Inflammatory Pigment Changes  Acute rash initially presented on the arm, spreading to the stomach, side, back, and legs. Initial assessment suggested shingles, but the rash did not follow a dermatome and lacked blisters, ruling out shingles. Differential diagnosis includes bug bites, contact dermatitis, and eczema. The rash improved with Medrol  Dosepak, topical steroids, and calamine lotion. Environmental factors considered, but no new exposures identified. Negative ANA and inflammatory markers suggest no autoimmune correlation with vision changes. No biopsy performed due to end-stage rash and prior prednisone  use.  - Prescribe clobetasol cream for topical use. Apply twice daily for two weeks, then take a break. Educate on judicious use to avoid side effects. - Advise to take pictures of any new rash and send for evaluation. - Provide samples of Eucerin Advanced Repair and La Roche-Posay Lipikar for skin moisturization. - Discuss the importance of moisturizing within ten minutes of showering to maintain skin barrier.  DERMATITIS, UNSPECIFIED   Related Medications clobetasol ointment (TEMOVATE) 0.05 % Apply topically 2 (two) times daily. Twice a day morning and night for two weeks then STOP.  Return in about 4 months (around 05/28/2024) for Follow up FBSE 4 to 5 months (unspecified dermatitis).  [name], am acting as Neurosurgeon for [employer], DO.   Documentation: I have reviewed the above documentation for accuracy and completeness, and I agree with the above.  Delon Lenis, DO

## 2024-01-27 ENCOUNTER — Encounter: Payer: Self-pay | Admitting: Neurology

## 2024-01-28 ENCOUNTER — Other Ambulatory Visit (HOSPITAL_COMMUNITY)

## 2024-01-28 ENCOUNTER — Other Ambulatory Visit: Payer: Self-pay | Admitting: Nurse Practitioner

## 2024-01-28 DIAGNOSIS — Z1231 Encounter for screening mammogram for malignant neoplasm of breast: Secondary | ICD-10-CM

## 2024-01-30 ENCOUNTER — Other Ambulatory Visit: Payer: Self-pay

## 2024-01-30 ENCOUNTER — Encounter: Payer: Self-pay | Admitting: Dermatology

## 2024-01-30 DIAGNOSIS — L309 Dermatitis, unspecified: Secondary | ICD-10-CM

## 2024-01-30 MED ORDER — CLOBETASOL PROPIONATE 0.05 % EX OINT: 1.0000 | TOPICAL_OINTMENT | Freq: Two times a day (BID) | CUTANEOUS | 0 refills | Status: AC

## 2024-02-09 ENCOUNTER — Ambulatory Visit (HOSPITAL_COMMUNITY): Admitting: Licensed Clinical Social Worker

## 2024-02-13 ENCOUNTER — Ambulatory Visit (INDEPENDENT_AMBULATORY_CARE_PROVIDER_SITE_OTHER): Admitting: Licensed Clinical Social Worker

## 2024-02-13 DIAGNOSIS — F330 Major depressive disorder, recurrent, mild: Secondary | ICD-10-CM

## 2024-02-13 DIAGNOSIS — F419 Anxiety disorder, unspecified: Secondary | ICD-10-CM | POA: Diagnosis not present

## 2024-02-13 NOTE — Progress Notes (Signed)
 THERAPIST PROGRESS NOTE   Session Date: 02/13/2024  Session Time: 0806 - 0912  Participation Level: Active  Behavioral Response: CasualAlertEuthymic  Type of Therapy: Individual Therapy  Treatment Goals addressed:   Initial (6) LTG: Increase coping skills to manage depression and improve ability to perform daily activities (OP Depression) STG: Reduce overall depression score by a minimum of 25% on the Patient Health Questionnaire (PHQ-9) (OP Depression) STG: Shelby Rios will identify cognitive patterns and beliefs that support depression (OP Depression) STG: Shelby Rios will reduce frequency of avoidant behaviors by 50% as evidenced by self-report in therapy sessions (OP Depression) STG: Shelby Rios will practice behavioral activation skills 3x per week for the next 24 weeks (OP Depression) LTG: Setting healthy boundaries, recognize I can't fix other people's problems, so that I'm not using all of my energy to try helping someone else (OP Depression)  Progress Towards Goals: Progressing  Interventions: CBT, Motivational Interviewing, Solution Focused, Strength-based, and Supportive  Summary: Shelby Rios is a 48 y.o. female with psych history of MDD and Anxiety, presenting for follow-up therapy session in efforts to improve management of depressive symptoms.   Patient actively engaged in session, presenting in pleasant moods, and congruent affect throughout duration of visit. Patient openly engaged in introductory check-in, sharing of Have had my appointment with specialist regarding my eye, further detailing of not being happy with result due to learning of there being no resolution and provider detailing of pt having already received medical work up to rule out all possibilities, sharing of having had a few brief periods of tearfulness and feeling disappointed. Finding self accepting that this is how her life is at the moment and faith that things will improve. Further processed feelings surrounding status  of medical concerns and processing potential for further exploration with alternate specialists. Processed factors within pt's control at this time and individual efforts at managing stressors surrounding medical needs and actionable steps pt can explore, such as connecting with allergist, for further evaluation. Pt identified additional stress surrounding family members needs and presenting challenges that prove to cause pt some added stress and concern, processing various factors that prove to impact family members difficulties and pt's overall inability to change outcomes at this time.     01/09/2024    8:27 AM 10/28/2023    9:44 AM 10/01/2023   10:14 AM 09/03/2023    8:17 AM 01/06/2023   10:15 AM  Depression screen PHQ 2/9  Decreased Interest 0 0 0 0 0  Down, Depressed, Hopeless 1 0 1 0 0  PHQ - 2 Score 1 0 1 0 0  Altered sleeping 1  1 1 2   Tired, decreased energy 1  1 2 2   Change in appetite 0  0 0 0  Feeling bad or failure about yourself  0  0 0 0  Trouble concentrating 0  1 3 2   Moving slowly or fidgety/restless 0  0 0 0  Suicidal thoughts 0  0 0 0  PHQ-9 Score 3  4 6 6   Difficult doing work/chores Somewhat difficult  Somewhat difficult Very difficult Somewhat difficult   Flowsheet Row ED from 11/23/2023 in [facility] Emergency Department at [facility] ED from 10/27/2023 in [facility] Emergency Department at [facility] Counselor from 09/03/2023 in [facility] at [facility] RISK CATEGORY No Risk No Risk No Risk   Suicidal/Homicidal: Nowithout intent/plan  Therapist Response:  Clinician openly greeted pt upon joining virtual visit, assessing presenting moods and affect, exploring  morning events and presenting moods. Utilized open ended questions in eliciting recounts of events of the past 2 weeks, exploring newly identified and/or experienced stressors, ongoing challenges, implications on moods, and individual efforts at navigating  stressors.  Utilized active listening techniques to provide support and validation of patient's shared thoughts, feelings, and perspectives in relation to continued medical challenges and newly experienced sxs.  Utilized CBT, MI, psychoed, and supportive reflection interventions to aid pt in navigating thoughts and feelings in relation to recent stressors.   [x]  Cognitive Challenging []  Cognitive Refocusing [x]  Cognitive Reframing  []  Communication Skills []  Compliance Issues []  DBT [x]  Exploration of Coping Patterns [x]  Exploration of Emotions [x]  Exploration of Relationship Patterns []  Guided Imagery []  Interactive Feedback []  Interpersonal Resolutions []  Mindfulness Training []  Preventative Services [x]  Psycho-Education  []  Relaxation/Deep Breathing [x]  Review of Treatment Plan/Progress []  Role-Play/Behavioral Rehearsal  [x]  Structured Problem Solving [x]  Supportive Reflection [x]  Symptom Management  []  Other    Patient responded well to interventions. Patient continues to meet criteria for MDD and Anxiety. Patient will continue to benefit from engagement in outpatient therapy due to being the least restrictive service to meet presenting needs. Patient proves to maintain mild progress towards identified treatment goals.  Plan: Return again in 2 weeks. Continue exploration of thought patterns, relationship between thoughts, feelings, behavioral responses/actions, challenging negative thoughts, anxious thoughts, and cognitive distortions.   Diagnosis:  Encounter Diagnoses  Name Primary?   MDD (major depressive disorder), recurrent episode, mild Yes   Anxiety     Collaboration of Care: Psychiatrist AEB provider documentation available in EHR.  Patient/Guardian was advised Release of Information must be obtained prior to any record release in order to collaborate their care with an outside provider. Patient/Guardian was advised if they have not already done so to contact the registration  department to sign all necessary forms in order for us  to release information regarding their care.   Consent: Patient/Guardian gives verbal consent for treatment and assignment of benefits for services provided during this visit. Patient/Guardian expressed understanding and agreed to proceed.   Virtual Visit via Video Note  I connected with Shelby Rios on 02/13/24 at  8:00 AM EDT by a video enabled telemedicine application and verified that I am speaking with the correct person using two identifiers.  Location: Patient: Home Provider: Home Office   I discussed the limitations of evaluation and management by telemedicine and the availability of in person appointments. The patient expressed understanding and agreed to proceed.  I discussed the assessment and treatment plan with the patient. The patient was provided an opportunity to ask questions and all were answered. The patient agreed with the plan and demonstrated an understanding of the instructions.   The patient was advised to call back or seek an in-person evaluation if the symptoms worsen or if the condition fails to improve as anticipated.  I provided 66 minutes of non-face-to-face time during this encounter.  Lynwood JONETTA Maris, MSW, LCSW 02/13/2024,  8:07 AM

## 2024-02-17 ENCOUNTER — Ambulatory Visit: Admitting: Neurology

## 2024-02-23 ENCOUNTER — Ambulatory Visit (INDEPENDENT_AMBULATORY_CARE_PROVIDER_SITE_OTHER): Admitting: Licensed Clinical Social Worker

## 2024-02-23 DIAGNOSIS — F330 Major depressive disorder, recurrent, mild: Secondary | ICD-10-CM

## 2024-02-23 DIAGNOSIS — F419 Anxiety disorder, unspecified: Secondary | ICD-10-CM | POA: Diagnosis not present

## 2024-02-23 NOTE — Progress Notes (Unsigned)
 THERAPIST PROGRESS NOTE   Session Date: 02/23/2024  Session Time: 1607 - 1720  Participation Level: Active  Behavioral Response: CasualAlertEuthymic  Type of Therapy: Individual Therapy  Treatment Goals addressed:   Initial (6) LTG: Increase coping skills to manage depression and improve ability to perform daily activities (OP Depression) STG: Reduce overall depression score by a minimum of 25% on the Patient Health Questionnaire (PHQ-9) (OP Depression) STG: Laylana will identify cognitive patterns and beliefs that support depression (OP Depression) STG: Tahni will reduce frequency of avoidant behaviors by 50% as evidenced by self-report in therapy sessions (OP Depression) STG: Raaga will practice behavioral activation skills 3x per week for the next 24 weeks (OP Depression) LTG: Setting healthy boundaries, recognize I can't fix other people's problems, so that I'm not using all of my energy to try helping someone else (OP Depression)  Progress Towards Goals: Progressing  Interventions: CBT, Motivational Interviewing, Solution Focused, Strength-based, and Supportive  Summary: Shelby Rios is a 48 y.o. female with psych history of MDD and Anxiety, presenting for follow-up therapy session in efforts to improve management of depressive symptoms.   Patient actively engaged in session, presenting in pleasant moods, and congruent affect throughout duration of visit. Patient openly engaged in introductory check-in, sharing of doing well overall, further engaging in recounts of events of the past two weeks. Pt actively detailed acts of service via church over the past week, having served meals to those in need, and sharing of having noticed an increase in attendance, further processing thoughts and feelings in relation to added stress and social factors. Pt further shared of recent interactions with mother, and finding self having to support mother in redirecting mother's worries and stress as it relates  to presenting social challenges. Pt further engaged in extensive exploration of family hx, relationships, and family's hx with scarce food resources, processing factors that may prove to impact mother's perspectives and feelings in relation to food security, and how upbringing and environmental factors, nurturing, proves to impact individuals perspectives and beliefs overall. Further explored how exposure to generational factors, environments, and trauma proves to impact ones perspectives, in turn having impacts on thoughts, feelings, and behavioral responses.     01/09/2024    8:27 AM 10/28/2023    9:44 AM 10/01/2023   10:14 AM 09/03/2023    8:17 AM 01/06/2023   10:15 AM  Depression screen PHQ 2/9  Decreased Interest 0 0 0 0 0  Down, Depressed, Hopeless 1 0 1 0 0  PHQ - 2 Score 1 0 1 0 0  Altered sleeping 1  1 1 2   Tired, decreased energy 1  1 2 2   Change in appetite 0  0 0 0  Feeling bad or failure about yourself  0  0 0 0  Trouble concentrating 0  1 3 2   Moving slowly or fidgety/restless 0  0 0 0  Suicidal thoughts 0  0 0 0  PHQ-9 Score 3  4 6 6   Difficult doing work/chores Somewhat difficult  Somewhat difficult Very difficult Somewhat difficult   Flowsheet Row ED from 11/23/2023 in [facility] Emergency Department at [facility] ED from 10/27/2023 in [facility] Emergency Department at [facility] Counselor from 09/03/2023 in [facility] at [facility] RISK CATEGORY No Risk No Risk No Risk   Suicidal/Homicidal: Nowithout intent/plan  Therapist Response:  Clinician openly greeted pt upon joining virtual visit, assessing presenting moods and affect, exploring presenting moods. Utilized open ended questions  in eliciting recounts of events of the past 2 weeks, exploring newly observed and/or identified stressors, recurring areas of difficulty, observed implications on moods, and individual approaches to navigating stress. Utilized active  listening techniques to provide support and validation of patient's shared thoughts, feelings, and perspectives in relation to newly observed factors that prove to impact pt and social factors that prove to be a current challenge within society.  Utilized CBT, psychoed, and supportive reflection interventions to aid pt in navigating thoughts and feelings in relation to identified challenges.   [x]  Cognitive Challenging []  Cognitive Refocusing [x]  Cognitive Reframing  []  Communication Skills []  Compliance Issues []  DBT [x]  Exploration of Coping Patterns [x]  Exploration of Emotions [x]  Exploration of Relationship Patterns []  Guided Imagery []  Interactive Feedback []  Interpersonal Resolutions []  Mindfulness Training []  Preventative Services [x]  Psycho-Education  []  Relaxation/Deep Breathing [x]  Review of Treatment Plan/Progress []  Role-Play/Behavioral Rehearsal  [x]  Structured Problem Solving [x]  Supportive Reflection [x]  Symptom Management  []  Other    Patient responded well to interventions. Patient continues to meet criteria for MDD and Anxiety. Patient will continue to benefit from engagement in outpatient therapy due to being the least restrictive service to meet presenting needs. Patient proves to maintain mild progress towards identified treatment goals.  Plan: Return again in 2 weeks. Resume exploration of thought patterns, relationship between thoughts, feelings, behavioral responses/actions, challenging negative thoughts, anxious thoughts, and cognitive distortions.   Diagnosis:  Encounter Diagnoses  Name Primary?   MDD (major depressive disorder), recurrent episode, mild Yes   Anxiety      Collaboration of Care: Psychiatrist AEB provider documentation available in EHR.  Patient/Guardian was advised Release of Information must be obtained prior to any record release in order to collaborate their care with an outside provider. Patient/Guardian was advised if they have not already  done so to contact the registration department to sign all necessary forms in order for us  to release information regarding their care.   Consent: Patient/Guardian gives verbal consent for treatment and assignment of benefits for services provided during this visit. Patient/Guardian expressed understanding and agreed to proceed.   Virtual Visit via Video Note  I connected with Shelby Rios on 02/23/24 at  4:00 PM EST by a video enabled telemedicine application and verified that I am speaking with the correct person using two identifiers.  Location: Patient: Home Provider: Home Office   I discussed the limitations of evaluation and management by telemedicine and the availability of in person appointments. The patient expressed understanding and agreed to proceed.  I discussed the assessment and treatment plan with the patient. The patient was provided an opportunity to ask questions and all were answered. The patient agreed with the plan and demonstrated an understanding of the instructions.   The patient was advised to call back or seek an in-person evaluation if the symptoms worsen or if the condition fails to improve as anticipated.  I provided 73 minutes of non-face-to-face time during this encounter.  Lynwood JONETTA Maris, MSW, LCSW 02/23/2024,  4:08 PM

## 2024-02-26 ENCOUNTER — Other Ambulatory Visit: Payer: Self-pay | Admitting: Medical Genetics

## 2024-02-27 ENCOUNTER — Encounter: Payer: Self-pay | Admitting: Nurse Practitioner

## 2024-02-27 ENCOUNTER — Ambulatory Visit (INDEPENDENT_AMBULATORY_CARE_PROVIDER_SITE_OTHER): Admitting: Nurse Practitioner

## 2024-02-27 VITALS — BP 122/76 | HR 68 | Ht 65.5 in | Wt 199.0 lb

## 2024-02-27 DIAGNOSIS — Z01419 Encounter for gynecological examination (general) (routine) without abnormal findings: Secondary | ICD-10-CM | POA: Diagnosis not present

## 2024-02-27 DIAGNOSIS — Z1331 Encounter for screening for depression: Secondary | ICD-10-CM

## 2024-02-27 DIAGNOSIS — Z79890 Hormone replacement therapy (postmenopausal): Secondary | ICD-10-CM | POA: Diagnosis not present

## 2024-02-27 MED ORDER — PROGESTERONE MICRONIZED 100 MG PO CAPS: 100.0000 mg | ORAL_CAPSULE | Freq: Every day | ORAL | 3 refills | Status: DC

## 2024-02-27 MED ORDER — ESTRADIOL 0.025 MG/24HR TD PTWK: 0.0250 mg | MEDICATED_PATCH | TRANSDERMAL | 3 refills | Status: DC

## 2024-02-27 NOTE — Progress Notes (Signed)
 Shelby Rios Jul 05, 1975 [id number]   History:  48 y.o. [id number] presents for annual exam. Started estradiol  patch in July for management of menopausal symptoms. Discontinued when she was being evaluated for some eye concerns. 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 Felt good while doing ERT and wants to discuss restarting. Symptoms consist of night sweats, fatigue, brain fog, joint pain, hair loss, GI issues, breast tenderness, decreased libido. Brain fog is most bothersome and affecting her work. Does feel hair loss has slowed down and is seeing new growth. Doing Mag L-threonate, Vit D. Mother had stroke at age 71. S/P TVH 2008 for endometriosis. Normal pap history. MDD, GAD managed by [facility].   Gynecologic History Patient's last menstrual period was 07/22/2006.   Contraception/Family planning: status post hysterectomy Sexually active: Yes  Health Maintenance Last Pap: UNK, prior to hysterectomy.  Last mammogram: 03/04/2023. Results were: Normal Last colonoscopy: 03/09/2021. Results were: Benign biopsies, 10-year recall Last Dexa: Not indicated     02/27/2024    9:42 AM  Depression screen PHQ 2/9  Decreased Interest 0  Down, Depressed, Hopeless 0  PHQ - 2 Score 0     Past medical history, past surgical history, family history and social history were all reviewed and documented in the EPIC chart. 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 48 yo daughter, at [employer], plans for law school. 72 yo daughter, married, lives in [place], 1 to daughter.   ROS:  A ROS was performed and pertinent positives and negatives are included.  Exam:  Vitals:   02/27/24 0943  BP: 122/76  Pulse: 68  SpO2: 99%  Weight: 199 lb (90.3 kg)  Height: 5' 5.5 (1.664 m)   Body mass index is 32.61 kg/m.  General appearance:  Normal Thyroid :  Symmetrical, normal in size, without palpable masses or nodularity. Respiratory  Auscultation:  Clear without wheezing or  rhonchi Cardiovascular  Auscultation:  Regular rate, without rubs, murmurs or gallops  Edema/varicosities:  Not grossly evident Abdominal  Soft,nontender, without masses, guarding or rebound.  Liver/spleen:  No organomegaly noted  Hernia:  None appreciated  Skin  Inspection:  Grossly normal Breasts: Examined lying and sitting.   Right: Without masses, retractions, nipple discharge or axillary adenopathy.   Left: Without masses, retractions, nipple discharge or axillary adenopathy. Pelvic: External genitalia:  no lesions              Urethra:  normal appearing urethra with no masses, tenderness or lesions              Bartholins and Skenes: normal                 Vagina: normal appearing vagina with normal color and discharge, no lesions              Cervix: absent Bimanual Exam:  Uterus:  absent              Adnexa: no mass, fullness, tenderness              Rectovaginal: Deferred              Anus:  normal, no lesions  Clotilda Pa, CMA present as chaperone.   Assessment/Plan:  48 y.o. [id number] for annual exam.   Well female exam with routine gynecological exam - Education provided on SBEs, importance of preventative screenings, current guidelines, high calcium diet, regular exercise, and multivitamin daily. Labs with PCP.   Postmenopausal hormone therapy - Plan: estradiol  (CLIMARA )  0.025 mg/24hr patch weekly, progesterone (PROMETRIUM) 100 MG capsule nightly. No increased risk for stroke with low dose patch. Mother had stroke at age 21.   Screening for cervical cancer - Normal Pap history.  No longer screening per guidelines.   Screening for breast cancer -  Mammogram scheduled this month. Continue annual screenings.  Normal breast exam today.  Screening for colon cancer - 02/2021 colonoscopy. Will repeat at 10-year interval per GI's recommendation.   Screening for osteoporosis - Average risk. Will plan DXA at age 14.   Return in about 1 year (around 02/26/2025) for  Annual.     Shelby DELENA Shutter DNP, 10:13 AM 02/27/2024

## 2024-03-01 ENCOUNTER — Other Ambulatory Visit (HOSPITAL_COMMUNITY)
Admission: RE | Admit: 2024-03-01 | Discharge: 2024-03-01 | Disposition: A | Discharge status: Home / Self Care | Payer: Self-pay | Source: Ambulatory Visit | Attending: Oncology | Admitting: Oncology

## 2024-03-02 ENCOUNTER — Encounter: Payer: Self-pay | Admitting: Nurse Practitioner

## 2024-03-02 ENCOUNTER — Other Ambulatory Visit: Payer: Self-pay

## 2024-03-02 DIAGNOSIS — Z79890 Hormone replacement therapy (postmenopausal): Secondary | ICD-10-CM

## 2024-03-02 MED ORDER — ESTRADIOL 0.025 MG/24HR TD PTWK: 0.0250 mg | MEDICATED_PATCH | TRANSDERMAL | 3 refills | Status: AC

## 2024-03-02 MED ORDER — PROGESTERONE MICRONIZED 100 MG PO CAPS
100.0000 mg | ORAL_CAPSULE | Freq: Every day | ORAL | 3 refills | Status: AC
Start: 2024-03-02 — End: ?

## 2024-03-02 NOTE — Telephone Encounter (Signed)
 Patient is requesting that 90 day supply of HRT's be sent to [facility] instead of local pharmacy.

## 2024-03-04 ENCOUNTER — Ambulatory Visit
Admission: RE | Admit: 2024-03-04 | Discharge: 2024-03-04 | Disposition: A | Source: Ambulatory Visit | Attending: Nurse Practitioner

## 2024-03-04 DIAGNOSIS — Z1231 Encounter for screening mammogram for malignant neoplasm of breast: Secondary | ICD-10-CM

## 2024-03-08 ENCOUNTER — Ambulatory Visit (INDEPENDENT_AMBULATORY_CARE_PROVIDER_SITE_OTHER): Admitting: Licensed Clinical Social Worker

## 2024-03-08 ENCOUNTER — Other Ambulatory Visit: Payer: Self-pay | Admitting: Nurse Practitioner

## 2024-03-08 DIAGNOSIS — R928 Other abnormal and inconclusive findings on diagnostic imaging of breast: Secondary | ICD-10-CM

## 2024-03-08 DIAGNOSIS — F330 Major depressive disorder, recurrent, mild: Secondary | ICD-10-CM

## 2024-03-08 DIAGNOSIS — F419 Anxiety disorder, unspecified: Secondary | ICD-10-CM | POA: Diagnosis not present

## 2024-03-08 NOTE — Progress Notes (Unsigned)
 THERAPIST PROGRESS NOTE   Session Date: 03/08/2024  Session Time: 1608 - 1711  Participation Level: Active  Behavioral Response: CasualAlertEuthymic  Type of Therapy: Individual Therapy  Treatment Goals addressed:   Initial (6) LTG: Increase coping skills to manage depression and improve ability to perform daily activities (OP Depression) STG: Reduce overall depression score by a minimum of 25% on the Patient Health Questionnaire (PHQ-9) (OP Depression) STG: Ambriana will identify cognitive patterns and beliefs that support depression (OP Depression) STG: Sally will reduce frequency of avoidant behaviors by 50% as evidenced by self-report in therapy sessions (OP Depression) STG: Evangela will practice behavioral activation skills 3x per week for the next 24 weeks (OP Depression) LTG: Setting healthy boundaries, recognize I can't fix other people's problems, so that I'm not using all of my energy to try helping someone else (OP Depression)  Progress Towards Goals: Progressing  Interventions: CBT, Motivational Interviewing, Solution Focused, Strength-based, and Supportive  Summary: Shelby Rios is a 48 y.o. female with psych history of MDD and Anxiety, presenting for follow-up therapy session in efforts to improve management of depressive symptoms.   Patient actively engaged in session, presenting in pleasant moods, and congruent affect throughout duration of visit. Patient openly engaged in introductory check-in, sharing It is going, having had a follow up appt with ophthalmologist today, finding that pt was previously referred to an alternate ophthalmologist previously having been under the impression for provider to have been considered a specialist, learning today of this being erroneous, and needing further referral to [facility] for Neuro-Ophthalmology.  Pt further shared of added stressors surrounding recent mammogram and learning of imaging findings via MyChart yesterday, proving to not feel  increasingly anxious about findings and overall awareness of need for further evaluation and potential imaging. Pt shared of historically having found self being increasingly anxious, recalling times when she would become consumed with anxious thoughts and feelings surrounding challenges, noting of having found self becoming more accepting of things that are outside of her control over recent years. Pt engaged in processing natural anxious response to catastrophize, exploring examples of such responses to varius scenarios. Engaged in exploration of 'Challenging Anxious Thoughts' handout, revisiting anxiety being healthy in small, manageable amounts, and increasingly challenging to navigate in increased amounts. Actively explored anxiety producing events and/or situations, the response of catastrophizing, and processing worst, best, and likely outcomes of events. Further processing rational vs. Irrational thoughts experienced in relation to worst vs. Likely outcomes, and how more rational thoughts in relation to likely outcomes prove to aid in reduction of anxiousness.     02/27/2024    9:42 AM 01/09/2024    8:27 AM 10/28/2023    9:44 AM 10/01/2023   10:14 AM 09/03/2023    8:17 AM  Depression screen PHQ 2/9  Decreased Interest 0 0 0 0 0  Down, Depressed, Hopeless 0 1 0 1 0  PHQ - 2 Score 0 1 0 1 0  Altered sleeping  1  1 1   Tired, decreased energy  1  1 2   Change in appetite  0  0 0  Feeling bad or failure about yourself   0  0 0  Trouble concentrating  0  1 3  Moving slowly or fidgety/restless  0  0 0  Suicidal thoughts  0  0 0  PHQ-9 Score  3   4  6    Difficult doing work/chores  Somewhat difficult  Somewhat difficult Very difficult     Data saved with a previous flowsheet  row definition   Flowsheet Row ED from 11/23/2023 in [facility] Emergency Department at [facility] ED from 10/27/2023 in [facility] Emergency Department at [facility]or from 09/03/2023 in [facility]  Outpatient Behavioral Health at [facility] RISK CATEGORY No Risk No Risk No Risk   Suicidal/Homicidal: Nowithout intent/plan  Therapist Response:  Clinician openly greeted pt upon joining virtual visit, assessing presenting moods and affect, exploring presenting moods. Utilized open ended questions in eliciting recounts of events of the past 2 weeks, exploring newly observed and/or identified stressors in relation to barriers and erroneous referrals for continued medical complications, observed implications on moods, and individual approaches to navigating stress. Utilized active listening techniques to provide support and validation of patient's shared thoughts, feelings, and perspectives in relation to presenting medical stressors.  Utilized CBT, psychoed, and supportive reflection interventions to aid pt in navigating thoughts and feelings in relation to identified challenges.   [x]  Cognitive Challenging []  Cognitive Refocusing [x]  Cognitive Reframing  []  Communication Skills []  Compliance Issues []  DBT [x]  Exploration of Coping Patterns [x]  Exploration of Emotions [x]  Exploration of Relationship Patterns []  Guided Imagery []  Interactive Feedback []  Interpersonal Resolutions []  Mindfulness Training []  Preventative Services [x]  Psycho-Education  []  Relaxation/Deep Breathing [x]  Review of Treatment Plan/Progress []  Role-Play/Behavioral Rehearsal  [x]  Structured Problem Solving [x]  Supportive Reflection [x]  Symptom Management  []  Other    Patient responded well to interventions. Patient continues to meet criteria for MDD and Anxiety. Patient will continue to benefit from engagement in outpatient therapy due to being the least restrictive service to meet presenting needs. Patient proves to maintain mild progress towards identified treatment goals.  Plan: Return again in 2 weeks. Resume exploration of thought patterns, relationship between thoughts, feelings, behavioral  responses/actions, challenging negative thoughts, anxious thoughts, and cognitive distortions.   Diagnosis:  Encounter Diagnoses  Name Primary?   MDD (major depressive disorder), recurrent episode, mild Yes   Anxiety    Collaboration of Care: Psychiatrist AEB provider documentation available in EHR.  Patient/Guardian was advised Release of Information must be obtained prior to any record release in order to collaborate their care with an outside provider. Patient/Guardian was advised if they have not already done so to contact the registration department to sign all necessary forms in order for us  to release information regarding their care.   Consent: Patient/Guardian gives verbal consent for treatment and assignment of benefits for services provided during this visit. Patient/Guardian expressed understanding and agreed to proceed.   Virtual Visit via Video Note  I connected with SHANY MARINEZ on 03/08/24 at  4:00 PM EST by a video enabled telemedicine application and verified that I am speaking with the correct person using two identifiers.  Location: Patient: Home Provider: Home Office   I discussed the limitations of evaluation and management by telemedicine and the availability of in person appointments. The patient expressed understanding and agreed to proceed.  I discussed the assessment and treatment plan with the patient. The patient was provided an opportunity to ask questions and all were answered. The patient agreed with the plan and demonstrated an understanding of the instructions.   The patient was advised to call back or seek an in-person evaluation if the symptoms worsen or if the condition fails to improve as anticipated.  I provided 63 minutes of non-face-to-face time during this encounter.  Lynwood JONETTA Maris, MSW, LCSW 03/08/2024,  4:08 PM

## 2024-03-11 ENCOUNTER — Ambulatory Visit
Admission: RE | Admit: 2024-03-11 | Discharge: 2024-03-11 | Disposition: A | Discharge status: Home / Self Care | Source: Ambulatory Visit | Attending: Nurse Practitioner | Admitting: Nurse Practitioner

## 2024-03-11 ENCOUNTER — Inpatient Hospital Stay: Admission: RE | Admit: 2024-03-11 | Discharge: 2024-03-11 | Attending: Nurse Practitioner

## 2024-03-11 DIAGNOSIS — R928 Other abnormal and inconclusive findings on diagnostic imaging of breast: Secondary | ICD-10-CM

## 2024-03-15 LAB — GENECONNECT MOLECULAR SCREEN: Genetic Analysis Overall Interpretation: NEGATIVE

## 2024-03-17 ENCOUNTER — Encounter

## 2024-03-17 ENCOUNTER — Other Ambulatory Visit

## 2024-03-22 ENCOUNTER — Ambulatory Visit (INDEPENDENT_AMBULATORY_CARE_PROVIDER_SITE_OTHER): Admitting: Licensed Clinical Social Worker

## 2024-03-22 DIAGNOSIS — F419 Anxiety disorder, unspecified: Secondary | ICD-10-CM | POA: Diagnosis not present

## 2024-03-22 DIAGNOSIS — F330 Major depressive disorder, recurrent, mild: Secondary | ICD-10-CM

## 2024-03-22 NOTE — Progress Notes (Unsigned)
 THERAPIST PROGRESS NOTE   Session Date: 03/22/2024  Session Time: 1604 - 1724  Participation Level: Active  Behavioral Response: CasualAlertEuthymic  Type of Therapy: Individual Therapy  Treatment Goals addressed:   Progressing (6) LTG: Increase coping skills to manage depression and improve ability to perform daily activities (OP Depression) STG: Reduce overall depression score by a minimum of 25% on the Patient Health Questionnaire (PHQ-9) (OP Depression) STG: Shelby Rios will identify cognitive patterns and beliefs that support depression (OP Depression) STG: Shelby Rios will reduce frequency of avoidant behaviors by 50% as evidenced by self-report in therapy sessions (OP Depression) STG: Shelby Rios will practice behavioral activation skills 3x per week for the next 24 weeks (OP Depression) LTG: Setting healthy boundaries, recognize I can't fix other people's problems, so that I'm not using all of my energy to try helping someone else (OP Depression)  Progress Towards Goals: Progressing  Interventions: CBT, Motivational Interviewing, Solution Focused, Strength-based, and Supportive  Summary: Shelby Rios is a 48 y.o. female with psych history of MDD and Anxiety, presenting for follow-up therapy session in efforts to improve management of depressive symptoms.   Patient actively engaged in session, presenting with pleasant mood and congruent affect.  atient openly engaged in introductory check-in, sharing of doing well overall, detailing of having had follow up mammogram appt, having had ultrasound and ruled out any potential concerns. Pt further shared of having connected with church member whom has personal hx of breast cancer in efforts of developing greater support in navigating uncertainties. Pt shared of having received contact from [facility], scheduling appt for 05/2024, reflecting of previously experienced feelings surrounding ongoing challenges.  Pt actively engaged in exploration  of recent Thanksgiving holiday, detailing of having spent time with daughters, granddaughters, and additional family members, sharing of having enjoyed time spent with family as well as celebrations surrounding granddaughters 2nd birthday.  Engaged in processing individual increased awareness surrounding negative thought patterns she experiences as well as finding husband to be pessimistic at time, revisiting the cognitive triangle and the relationship between thoughts, feelings, and ones behaviors, processing pt's individual efforts in utilizing techniques for challenging negative thoughts and noted benefits.     02/27/2024    9:42 AM 01/09/2024    8:27 AM 10/28/2023    9:44 AM 10/01/2023   10:14 AM 09/03/2023    8:17 AM  Depression screen PHQ 2/9  Decreased Interest 0 0 0 0 0  Down, Depressed, Hopeless 0 1 0 1 0  PHQ - 2 Score 0 1 0 1 0  Altered sleeping  1  1 1   Tired, decreased energy  1  1 2   Change in appetite  0  0 0  Feeling bad or failure about yourself   0  0 0  Trouble concentrating  0  1 3  Moving slowly or fidgety/restless  0  0 0  Suicidal thoughts  0  0 0  PHQ-9 Score  3   4  6    Difficult doing work/chores  Somewhat difficult  Somewhat difficult Very difficult     Data saved with a previous flowsheet row definition   Flowsheet Row ED from 11/23/2023 in [facility] Emergency Department at [facility] ED from 10/27/2023 in [facility] at [facility] Counselor from 09/03/2023 in [facility] Outpatient Behavioral Health at [facility] RISK CATEGORY No Risk No Risk No Risk   Suicidal/Homicidal: Nowithout intent/plan  Therapist Response:  Clinician openly greeted pt upon joining virtual visit, assessing presenting  moods and affect, exploring presenting moods. Utilized open ended questions in eliciting recounts of events of the past 2 weeks, exploring identified progressions in relation to ongoing stressors, and further evoking pt's  thoughts, feelings, perspectives, and moods surrounding recent and historic stressors. Utilized active listening techniques to provide support and validation of patient's shared thoughts, feelings, and perspectives. CBT, psychoeducation, family systems approaches, and strengths-based interventions utilized to support processing of medical stressors, family dynamics, and cognitive patterns.  [x]  Cognitive Challenging []  Cognitive Refocusing [x]  Cognitive Reframing  []  Communication Skills []  Compliance Issues []  DBT [x]  Exploration of Coping Patterns [x]  Exploration of Emotions [x]  Exploration of Relationship Patterns []  Guided Imagery []  Interactive Feedback []  Interpersonal Resolutions []  Mindfulness Training []  Preventative Services [x]  Psycho-Education  []  Relaxation/Deep Breathing [x]  Review of Treatment Plan/Progress []  Role-Play/Behavioral Rehearsal  [x]  Structured Problem Solving [x]  Supportive Reflection [x]  Symptom Management  []  Other    Patient responded well to interventions. Patient continues to meet criteria for MDD and Anxiety. Patient will continue to benefit from engagement in outpatient therapy due to being the least restrictive service to meet presenting needs. Patient proves to maintain mild progress towards identified treatment goals.  Plan: Return again in 2 weeks. Continue exploration of thought patterns, relationship between thoughts, feelings, behavioral responses/actions, challenging negative thoughts, anxious thoughts, and cognitive distortions.   Diagnosis:  Encounter Diagnoses  Name Primary?   MDD (major depressive disorder), recurrent episode, mild Yes   Anxiety     Collaboration of Care: Psychiatrist AEB provider documentation available in EHR.  Patient/Guardian was advised Release of Information must be obtained prior to any record release in order to collaborate their care with an outside provider. Patient/Guardian was advised if they have not already done  so to contact the registration department to sign all necessary forms in order for us  to release information regarding their care.   Consent: Patient/Guardian gives verbal consent for treatment and assignment of benefits for services provided during this visit. Patient/Guardian expressed understanding and agreed to proceed.   Virtual Visit via Video Note  I connected with Shelby Rios on 03/22/24 at  4:00 PM EST by a video enabled telemedicine application and verified that I am speaking with the correct person using two identifiers.  Location: Patient: Home Provider: Home Office   I discussed the limitations of evaluation and management by telemedicine and the availability of in person appointments. The patient expressed understanding and agreed to proceed.  I discussed the assessment and treatment plan with the patient. The patient was provided an opportunity to ask questions and all were answered. The patient agreed with the plan and demonstrated an understanding of the instructions.   The patient was advised to call back or seek an in-person evaluation if the symptoms worsen or if the condition fails to improve as anticipated.  I provided 80 minutes of non-face-to-face time during this encounter.  Lynwood JONETTA Maris, MSW, LCSW 03/22/2024,  4:07 PM

## 2024-03-31 ENCOUNTER — Encounter: Payer: Self-pay | Admitting: Neurology

## 2024-04-02 ENCOUNTER — Encounter (HOSPITAL_COMMUNITY): Payer: Self-pay

## 2024-04-05 ENCOUNTER — Encounter (HOSPITAL_COMMUNITY): Payer: Self-pay | Admitting: Psychiatry

## 2024-04-05 ENCOUNTER — Ambulatory Visit (HOSPITAL_COMMUNITY): Admitting: Licensed Clinical Social Worker

## 2024-04-05 ENCOUNTER — Telehealth (HOSPITAL_COMMUNITY): Admitting: Psychiatry

## 2024-04-05 VITALS — Wt 197.0 lb

## 2024-04-05 DIAGNOSIS — F330 Major depressive disorder, recurrent, mild: Secondary | ICD-10-CM

## 2024-04-05 DIAGNOSIS — F419 Anxiety disorder, unspecified: Secondary | ICD-10-CM

## 2024-04-05 MED ORDER — BUPROPION HCL ER (XL) 300 MG PO TB24: 300.0000 mg | ORAL_TABLET | Freq: Every day | ORAL | 0 refills | Status: AC
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
## 2024-04-07 ENCOUNTER — Encounter: Payer: Self-pay | Admitting: Neurology

## 2024-04-07 ENCOUNTER — Ambulatory Visit: Admitting: Neurology

## 2024-04-07 VITALS — BP 130/63 | HR 77 | Resp 20 | Ht 65.5 in | Wt 206.0 lb

## 2024-04-07 DIAGNOSIS — H469 Unspecified optic neuritis: Secondary | ICD-10-CM

## 2024-04-08 ENCOUNTER — Ambulatory Visit: Admitting: Neurology

## 2024-04-08 ENCOUNTER — Ambulatory Visit (HOSPITAL_COMMUNITY): Admitting: Licensed Clinical Social Worker

## 2024-04-08 DIAGNOSIS — F330 Major depressive disorder, recurrent, mild: Secondary | ICD-10-CM | POA: Diagnosis not present

## 2024-04-08 DIAGNOSIS — F419 Anxiety disorder, unspecified: Secondary | ICD-10-CM | POA: Diagnosis not present

## 2024-04-08 NOTE — Progress Notes (Signed)
 THERAPIST PROGRESS NOTE   Session Date: 04/08/2024  Session Time: 1410 - 1514  Participation Level: Active  Behavioral Response: CasualAlertEuthymic  Type of Therapy: Individual Therapy  Treatment Goals addressed:   Progressing (4) STG: Shelby Rios will identify cognitive patterns and beliefs that support depression (OP Depression) STG: Shelby Rios will reduce frequency of avoidant behaviors by 50% as evidenced by self-report in therapy sessions (OP Depression) STG: Shelby Rios will practice behavioral activation skills 3x per week for the next 24 weeks (OP Depression) LTG: Setting healthy boundaries, recognize I can't fix other people's problems, so that I'm not using all of my energy to try helping someone else (OP Depression)  Completed/Met (2) LTG: Increase coping skills to manage depression and improve ability to perform daily activities (OP Depression) STG: Reduce overall depression score by a minimum of 25% on the Patient Health Questionnaire (PHQ-9) (OP Depression)  Progress Towards Goals: Progressing  Interventions: CBT, Motivational Interviewing, Solution Focused, Strength-based, and Supportive  Summary: Shelby Rios is a 48 y.o. female with psych history of MDD and Anxiety, presenting for follow-up therapy session in efforts to improve management of depressive symptoms.   Patient actively engaged in session, presenting with an overall pleasant mood and congruent affect. Pt openly participated in check-in, reporting doing well overall but noticing a decline in mood over the past week as the holidays approach.  Pt engaged in PHQ-9 re-assessment, with no change in severity level but minor shifts in symptom presentation. Pt processed these variances, noting mild increases in brief anxious episodes over recent weeks. Pt explored potential triggers including increased caffeine  intake and emotionally taxing phone conversations with mother, sharing of having realized mother will never be the type of  person pt has historically, as well as currently needs.  Pt also discussed increased awareness of spouses emotional difficulty approaching the second holiday season since FILs passing, recognizing this unspoken challenges to potentially having contributed to her own emotional shifts. Pt recounted a recent instance in which spouse appeared visibly distressed and finally verbalized his grief.  Pt additionally described increased stress from multiple church commitments and volunteer obligations requiring time and energy during the holiday season.  Engaged in 20mo review of tx plan, processing progressions and continued areas for work.      04/08/2024    2:15 PM 02/27/2024    9:42 AM 01/09/2024    8:27 AM 10/28/2023    9:44 AM 10/01/2023   10:14 AM  Depression screen PHQ 2/9  Decreased Interest 1 0 0 0 0  Down, Depressed, Hopeless 0 0 1 0 1  PHQ - 2 Score 1 0 1 0 1  Altered sleeping 0  1  1  Tired, decreased energy 1  1  1   Change in appetite 0  0  0  Feeling bad or failure about yourself  0  0  0  Trouble concentrating 1  0  1  Moving slowly or fidgety/restless 0  0  0  Suicidal thoughts 0  0  0  PHQ-9 Score 3  3   4    Difficult doing work/chores Somewhat difficult  Somewhat difficult  Somewhat difficult     Data saved with a previous flowsheet row definition   Flowsheet Row ED from 11/23/2023 in [facility] Emergency Department at [facility] ED from 10/27/2023 in [facility] Department at [facility] Counselor from 09/03/2023 in [facility] at [facility] RISK CATEGORY No Risk No Risk No Risk   Suicidal/Homicidal: Nowithout  intent/plan  Therapist Response:  Clinician openly greeted pt, assessing presenting moods and affect, exploring presenting moods. Utilized open ended questions in eliciting recounts of events of the past 2 weeks, utilizing active listening techniques to provide support and validation of patient's shared  thoughts, feelings, and perspectives. CBT, psychoeducation, family systems approaches, and strengths-based interventions utilized to support processing of medical stressors, family dynamics, and cognitive patterns.  [x]  Cognitive Challenging []  Cognitive Refocusing [x]  Cognitive Reframing  []  Communication Skills []  Compliance Issues []  DBT []  Exploration of Coping Patterns [x]  Exploration of Emotions [x]  Exploration of Relationship Patterns []  Guided Imagery []  Interactive Feedback []  Interpersonal Resolutions []  Mindfulness Training []  Preventative Services [x]  Psycho-Education  []  Relaxation/Deep Breathing [x]  Review of Treatment Plan/Progress []  Role-Play/Behavioral Rehearsal  [x]  Structured Problem Solving [x]  Supportive Reflection [x]  Symptom Management  []  Other    Patient responded well to interventions. Patient continues to meet criteria for MDD and Anxiety. Patient will continue to benefit from engagement in outpatient therapy due to being the least restrictive service to meet presenting needs. Patient proves to maintain mild progress towards identified treatment goals.  Plan: Return again in 2 weeks.    Diagnosis:  Encounter Diagnoses  Name Primary?   MDD (major depressive disorder), recurrent episode, mild Yes   Anxiety     Collaboration of Care: Psychiatrist AEB provider documentation available in EHR.  Patient/Guardian was advised Release of Information must be obtained prior to any record release in order to collaborate their care with an outside provider. Patient/Guardian was advised if they have not already done so to contact the registration department to sign all necessary forms in order for us  to release information regarding their care.   Consent: Patient/Guardian gives verbal consent for treatment and assignment of benefits for services provided during this visit. Patient/Guardian expressed understanding and agreed to proceed.    Lynwood JONETTA Maris, MSW,  LCSW 04/08/2024,  2:18 PM

## 2024-04-20 ENCOUNTER — Ambulatory Visit (HOSPITAL_COMMUNITY): Admitting: Licensed Clinical Social Worker

## 2024-04-20 DIAGNOSIS — F419 Anxiety disorder, unspecified: Secondary | ICD-10-CM | POA: Diagnosis not present

## 2024-04-20 DIAGNOSIS — F330 Major depressive disorder, recurrent, mild: Secondary | ICD-10-CM
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
## 2024-04-27 ENCOUNTER — Encounter: Payer: Self-pay | Admitting: Internal Medicine

## 2024-04-27 ENCOUNTER — Ambulatory Visit (INDEPENDENT_AMBULATORY_CARE_PROVIDER_SITE_OTHER): Admitting: Internal Medicine

## 2024-04-27 VITALS — BP 130/80 | HR 81 | Temp 97.8°F | Ht 66.0 in | Wt 205.2 lb

## 2024-04-27 DIAGNOSIS — M7712 Lateral epicondylitis, left elbow: Secondary | ICD-10-CM

## 2024-04-27 MED ORDER — MELOXICAM 7.5 MG PO TABS: 7.5000 mg | ORAL_TABLET | Freq: Two times a day (BID) | ORAL | 0 refills | Status: AC

## 2024-04-27 NOTE — Progress Notes (Signed)
 " [facility]IMARY [facility]ARE [facility] [address] [place] [address] Dept: [phone] Dept Fax: [phone]  Acute Care Office Visit  Subjective:   Shelby Rios 1975/05/18 04/27/2024  Chief Complaint  Patient presents with   Hand Pain    Left hand starts at elbow trouble sleeping difficult for about month seems to be getting worse    HPI:  Discussed the use of AI scribe software for clinical note transcription with the patient, who gave verbal consent to proceed.  History of Present Illness   Shelby Rios is a 49 year old female who presents with left elbow pain.  She has been experiencing left elbow pain ongoing for over a month.  Pain does radiate to forearm.    The pain is exacerbated by turning the arm and reaching down, with the most intense pain from the elbow down, occasionally radiating upwards. She recently experienced a crampy feeling on the top of her left hand, extending into the fingers.  She does do a lot of typing for work.  The pain is severe enough to prevent her from sleeping on her left side.  No numbness or tingling sensations are present. She avoids using her left hand and arm for lifting or yard work due to the pain. No weakness or inability to hold objects is noted.No wrist pain.   Patient tried heating pad and ibuprofen earlier in the month which did not seem to alleviate the symptoms.    The following portions of the patient's history were reviewed and updated as appropriate: past medical history, past surgical history, family history, social history, allergies, medications, and problem list.   Patient Active Problem List   Diagnosis Date Noted   Papilledema of left eye 11/18/2023   Hot flashes 01/06/2023   Recurrent major depressive disorder, in partial remission 07/05/2022   HTN (hypertension), benign 01/03/2022   Family history of premature CAD 01/01/2021   History of endometriosis 01/01/2021   Family  history of ovarian cancer 01/01/2021   Pelvic pain 01/01/2021   Hypercholesteremia 01/01/2021   Migraines 12/21/2018   Chronic GERD 12/20/2018   S/P hysterectomy 10/02/2016   OSA (obstructive sleep apnea) 11/06/2015   Other fatigue 09/19/2015   Vitamin D  deficiency 11/02/2014   Acute left-sided low back pain without sciatica 09/17/2011   PALPITATIONS 08/09/2009   Obesity 01/19/2009   Depression 06/03/2007   Past Medical History:  Diagnosis Date   Abnormal mammogram, unspecified    Abnormal Pap smear of cervix    Abnormal weight gain    Allergy    Seasonal   Anemia    Anxiety    Back pain    Breast pain, right    Depression    Dyspepsia    Edema    Endometriosis    Fatigue    GERD (gastroesophageal reflux disease)    H. pylori infection    HSV-1 infection    oral   Hypertension    only during pregnancy   Migraines    Nonspecific abnormal results of other specified function study    Obesity    Palpitations    Routine general medical examination at a health care facility    Seizures [facility])    couple seisure around age 49- tx with dilantin for a period then d/c   Sleep apnea    c-pap wears   Vitamin B12 deficiency 11/02/2014   Past Surgical History:  Procedure Laterality Date   adenoidectomy  BRCA test     neg   DILATION AND CURETTAGE OF UTERUS     IR LUMBAR PUNCTURE  12/30/2023   PARTIAL HYSTERECTOMY  04/22/2006   endometriosis, done vaginally, she has her ovaries   TONSILLECTOMY     WISDOM TOOTH EXTRACTION     Family History  Problem Relation Age of Onset   Migraines Mother    Hyperlipidemia Mother    Ovarian cancer Mother    Stroke Mother 50   Diabetes Mother    Seizures Mother    Depression Mother    Hypertension Mother    Obesity Mother    Colon polyps Mother    Irritable bowel syndrome Mother    Esophagitis Mother    Anxiety disorder Mother    Hyperlipidemia Father    Heart disease Father        CABG age 29   Diabetes Father     Depression Father    Early death Father    Hypertension Father    Obesity Father    Hypokalemia Sister        deceased at 34; was getting regular IV K infusions; long Hx of pseudotumor cerebri   Early death Sister    Migraines Maternal Aunt    Migraines Maternal Uncle    Breast cancer Maternal Grandmother    COPD Maternal Grandmother    Heart attack Maternal Grandfather    Heart attack Paternal Grandfather    Current Medications[1] Allergies[2]   ROS: A complete ROS was performed with pertinent positives/negatives noted in the HPI. The remainder of the ROS are negative.    Objective:   Today's Vitals   04/27/24 1522  BP: 130/80  Pulse: 81  Temp: 97.8 F (36.6 C)  TempSrc: Temporal  SpO2: 98%  Weight: 205 lb 3.2 oz (93.1 kg)  Height: 5' 6 (1.676 m)    GENERAL: Well-appearing, in NAD. Well nourished.  SKIN: Pink, warm and dry. No rash or ecchymosis. RESPIRATORY: Chest wall symmetrical. Respirations even and non-labored.  CARDIAC: \\Peripheral pulses 2+ bilaterally.  MSK: Muscle tone and strength appropriate for age.  Negative Tinel's sign.  Negative Phalen sign.  5/ 5 grip strength bilaterally.  No sensory deficits.  Tenderness to palpation to lateral epicondyle.  Tenderness with medial rotation to left forearm.  EXTREMITIES: Without clubbing, cyanosis, or edema.  NEUROLOGIC: No motor or sensory deficits.  PSYCH/MENTAL STATUS: Alert, oriented x 3. Cooperative, appropriate mood and affect.    No results found for any visits on 04/27/24.    Assessment & Plan:  Assessment and Plan    Lateral epicondylitis, left elbow - Recommend elbow brace. - Apply ice to reduce inflammation. - Prescribe meloxicam  7.5mg  orally twice daily for two weeks. - Refer to orthopedics if symptoms persist after two weeks.      Meds ordered this encounter  Medications   meloxicam  (MOBIC ) 7.5 MG tablet    Sig: Take 1 tablet (7.5 mg total) by mouth 2 (two) times daily for 14 days.     Dispense:  28 tablet    Refill:  0    Supervising Provider:   SEBASTIAN BEVERLEY NOVAK [[id number]]   No orders of the defined types were placed in this encounter.  Lab Orders  No laboratory test(s) ordered today   No images are attached to the encounter or orders placed in the encounter.  Return if symptoms worsen or fail to improve.   Rosina Senters, FNP     [1]  Current Outpatient Medications:  buPROPion  (WELLBUTRIN  XL) 300 MG 24 hr tablet, Take 1 tablet (300 mg total) by mouth daily., Disp: 90 tablet, Rfl: 0   cholecalciferol (VITAMIN D3) 25 MCG (1000 UNIT) tablet, Take 1,000 Units by mouth daily., Disp: , Rfl:    clobetasol  ointment (TEMOVATE ) 0.05 %, Apply 1 Application topically 2 (two) times daily. Apply 2 times daily for 2 weeks then STOP and Take a break for 2 weeks, Disp: 120 g, Rfl: 0   estradiol  (CLIMARA ) 0.025 mg/24hr patch, Place 1 patch (0.025 mg total) onto the skin once a week., Disp: 12 patch, Rfl: 3   ibuprofen (ADVIL) 200 MG tablet, Take 200 mg by mouth as needed., Disp: , Rfl:    loratadine (CLARITIN) 10 MG tablet, Take 10 mg by mouth daily., Disp: , Rfl:    MAGNESIUM PO, Take 250 mg by mouth., Disp: , Rfl:    meloxicam  (MOBIC ) 7.5 MG tablet, Take 1 tablet (7.5 mg total) by mouth 2 (two) times daily for 14 days., Disp: 28 tablet, Rfl: 0   metFORMIN (GLUCOPHAGE-XR) 500 MG 24 hr tablet, , Disp: , Rfl:    multivitamin-iron-minerals-folic acid (CENTRUM) chewable tablet, Chew 1 tablet by mouth daily., Disp: , Rfl:    pantoprazole  (PROTONIX ) 20 MG tablet, TAKE 1 TABLET BY MOUTH DAILY AS  NEEDED, Disp: 90 tablet, Rfl: 1   progesterone  (PROMETRIUM ) 100 MG capsule, Take 1 capsule (100 mg total) by mouth daily., Disp: 90 capsule, Rfl: 3   hydrOXYzine  (VISTARIL ) 25 MG capsule, Take 1 capsule (25 mg total) by mouth every 8 (eight) hours as needed. (Patient not taking: Reported on 04/27/2024), Disp: 30 capsule, Rfl: 0 [2] No Known Allergies  "

## 2024-04-27 NOTE — Patient Instructions (Signed)
 Use elbow brace  Ice  Take Meloxicam  as prescribed. Do NOT take additional ibuprofen or naproxen  (aleve ) with this prescription.

## 2024-05-03 ENCOUNTER — Ambulatory Visit (INDEPENDENT_AMBULATORY_CARE_PROVIDER_SITE_OTHER): Admitting: Licensed Clinical Social Worker

## 2024-05-03 DIAGNOSIS — F330 Major depressive disorder, recurrent, mild: Secondary | ICD-10-CM

## 2024-05-03 DIAGNOSIS — F419 Anxiety disorder, unspecified: Secondary | ICD-10-CM

## 2024-05-03 NOTE — Progress Notes (Signed)
 THERAPIST PROGRESS NOTE   Session Date: 05/03/2024  Session Time: 0805 - 9083 Virtual Visit via Video Note  I connected with Shelby Rios on 05/03/2024 at  8:00 AM EST by a video enabled telemedicine application and verified that I am speaking with the correct person using two identifiers.  Location: Patient: Home Provider: Home office   I discussed the limitations of evaluation and management by telemedicine and the availability of in person appointments. The patient expressed understanding and agreed to proceed.  I discussed the assessment and treatment plan with the patient. The patient was provided an opportunity to ask questions and all were answered. The patient agreed with the plan and demonstrated an understanding of the instructions.   The patient was advised to call back or seek an in-person evaluation if the symptoms worsen or if the condition fails to improve as anticipated.  I provided 71 minutes of non-face-to-face time during this encounter.  Participation Level: Active  Behavioral Response: CasualAlertEuthymic  Type of Therapy: Individual Therapy  Treatment Goals addressed:   Progressing (4) STG: Dhwani will identify cognitive patterns and beliefs that support depression (OP Depression) STG: Janiece will reduce frequency of avoidant behaviors by 50% as evidenced by self-report in therapy sessions (OP Depression) STG: Marirose will practice behavioral activation skills 3x per week for the next 24 weeks (OP Depression) LTG: Setting healthy boundaries, recognize I can't fix other people's problems, so that I'm not using all of my energy to try helping someone else (OP Depression)  Completed/Met (2) LTG: Increase coping skills to manage depression and improve ability to perform daily activities (OP Depression) STG: Reduce overall depression score by a minimum of 25% on the Patient Health Questionnaire (PHQ-9) (OP Depression)  Progress Towards Goals:  Progressing  Interventions: CBT, Motivational Interviewing, Solution Focused, Strength-based, and Supportive  Summary: Shelby Rios is a 49 y.o. female with psych history of MDD and Anxiety, presenting for follow-up therapy session in efforts to improve management of depressive symptoms.   Patient actively engaged in session, presenting with a pleasant mood and congruent affect. Pt reflected on the past two weeks, sharing of positive time spent with family over New Years before returning to work. Pt reported youngest daughter (15) returned to college after traveling with friends and spending time at home. Pt discussed daughters academic and social journey over recent years, including past challenges, increasing confidence, and current anticipation surrounding law school admissions.  Pt further explored differences between oldest and youngest daughters, reflecting on contrasting academic motivations, social functioning, and long-term goals. Pt processed how cultural, financial, and demographic factors influenced youngest daughters experiences at [facility], shaping her identity, social awareness, and commitment to advocacy. Pt expressed pride in daughters development and clarity in her pursuit of law.  Pt also shared personal reflections about her own career trajectory, exploring early considerations of returning to school in coming years to pursue counseling, noting increased alignment with helping roles and desire for meaningful work.     04/08/2024    2:15 PM 02/27/2024    9:42 AM 01/09/2024    8:27 AM 10/28/2023    9:44 AM 10/01/2023   10:14 AM  Depression screen PHQ 2/9  Decreased Interest 1 0 0 0 0  Down, Depressed, Hopeless 0 0 1 0 1  PHQ - 2 Score 1 0 1 0 1  Altered sleeping 0  1  1  Tired, decreased energy 1  1  1   Change in appetite 0  0  0  Feeling  bad or failure about yourself  0  0  0  Trouble concentrating 1  0  1  Moving slowly or fidgety/restless 0  0  0  Suicidal thoughts 0  0   0  PHQ-9 Score 3  3   4    Difficult doing work/chores Somewhat difficult  Somewhat difficult  Somewhat difficult     Data saved with a previous flowsheet row definition   Flowsheet Row ED from 11/23/2023 in [facility] at [facility] ED from 10/27/2023 in [facility] Department at [facility] Counselor from 09/03/2023 in [facility] at [facility] RISK CATEGORY No Risk No Risk No Risk   Suicidal/Homicidal: Nowithout intent/plan  Therapist Response:  Clinician openly greeted pt, assessing presenting moods and affect, engaging in check-in. Utilized open ended questions in eliciting recounts of events of the past 2 weeks, utilizing active listening techniques to provide support and validation of patient's shared thoughts, feelings, and perspectives. Applied CBT reflection to examine family dynamics and meaning-making; used MI strategies to explore pts emerging career interests and values; provided supportive validation, normalization, and reflective listening throughout. Pt demonstrated insight, emotional stability, and strong engagement. Appeared thoughtful and future-oriented. No safety concerns observed. Continued benefit expected from ongoing supportive and exploratory therapy.  [x]  Cognitive Challenging []  Cognitive Refocusing [x]  Cognitive Reframing  []  Communication Skills []  Compliance Issues []  DBT []  Exploration of Coping Patterns [x]  Exploration of Emotions [x]  Exploration of Relationship Patterns []  Guided Imagery []  Interactive Feedback []  Interpersonal Resolutions []  Mindfulness Training []  Preventative Services [x]  Psycho-Education  []  Relaxation/Deep Breathing []  Review of Treatment Plan/Progress []  Role-Play/Behavioral Rehearsal  []  Structured Problem Solving [x]  Supportive Reflection []  Symptom Management  []  Other    Patient responded well to interventions. Patient continues to meet criteria for  MDD and Anxiety. Patient will continue to benefit from engagement in outpatient therapy due to being the least restrictive service to meet presenting needs. Patient proves to maintain progress towards identified treatment goals.  Plan: Return again in 2 weeks.    Diagnosis:  Encounter Diagnoses  Name Primary?   Anxiety Yes   MDD (major depressive disorder), recurrent episode, mild      Collaboration of Care: Psychiatrist AEB provider documentation available in EHR.  Patient/Guardian was advised Release of Information must be obtained prior to any record release in order to collaborate their care with an outside provider. Patient/Guardian was advised if they have not already done so to contact the registration department to sign all necessary forms in order for us  to release information regarding their care.   Consent: Patient/Guardian gives verbal consent for treatment and assignment of benefits for services provided during this visit. Patient/Guardian expressed understanding and agreed to proceed.    Lynwood JONETTA Maris, MSW, LCSW 05/03/2024,  8:07 AM

## 2024-05-21 ENCOUNTER — Ambulatory Visit (HOSPITAL_COMMUNITY): Admitting: Licensed Clinical Social Worker

## 2024-06-07 ENCOUNTER — Ambulatory Visit (HOSPITAL_COMMUNITY): Admitting: Licensed Clinical Social Worker

## 2024-06-21 ENCOUNTER — Ambulatory Visit (HOSPITAL_COMMUNITY): Admitting: Licensed Clinical Social Worker

## 2024-06-24 ENCOUNTER — Ambulatory Visit: Admitting: Dermatology

## 2024-07-05 ENCOUNTER — Telehealth (HOSPITAL_COMMUNITY): Admitting: Psychiatry

## 2024-07-07 ENCOUNTER — Ambulatory Visit: Payer: Self-pay | Admitting: Nurse Practitioner

## 2024-10-19 ENCOUNTER — Ambulatory Visit: Payer: Self-pay | Admitting: Neurology

## 2025-01-11 ENCOUNTER — Encounter: Admitting: Nurse Practitioner

## 2025-03-01 ENCOUNTER — Ambulatory Visit: Admitting: Nurse Practitioner
